# Patient Record
Sex: Female | Born: 1950
Health system: Southern US, Community
[De-identification: ages and names within clinical notes are randomized; demographics above are authoritative.]

## PROBLEM LIST (undated history)

## (undated) DIAGNOSIS — Z0282 Encounter for adoption services: Secondary | ICD-10-CM

## (undated) DIAGNOSIS — M810 Age-related osteoporosis without current pathological fracture: Secondary | ICD-10-CM

## (undated) DIAGNOSIS — R011 Cardiac murmur, unspecified: Secondary | ICD-10-CM

## (undated) DIAGNOSIS — I4892 Unspecified atrial flutter: Secondary | ICD-10-CM

## (undated) DIAGNOSIS — C439 Malignant melanoma of skin, unspecified: Secondary | ICD-10-CM

## (undated) DIAGNOSIS — K759 Inflammatory liver disease, unspecified: Secondary | ICD-10-CM

## (undated) DIAGNOSIS — F32A Depression, unspecified: Secondary | ICD-10-CM

## (undated) DIAGNOSIS — G4730 Sleep apnea, unspecified: Secondary | ICD-10-CM

## (undated) DIAGNOSIS — Z8709 Personal history of other diseases of the respiratory system: Secondary | ICD-10-CM

## (undated) DIAGNOSIS — T8859XA Other complications of anesthesia, initial encounter: Secondary | ICD-10-CM

## (undated) DIAGNOSIS — C801 Malignant (primary) neoplasm, unspecified: Secondary | ICD-10-CM

## (undated) DIAGNOSIS — I8290 Acute embolism and thrombosis of unspecified vein: Secondary | ICD-10-CM

## (undated) DIAGNOSIS — D649 Anemia, unspecified: Secondary | ICD-10-CM

## (undated) DIAGNOSIS — T753XXA Motion sickness, initial encounter: Secondary | ICD-10-CM

## (undated) DIAGNOSIS — C44300 Unspecified malignant neoplasm of skin of unspecified part of face: Secondary | ICD-10-CM

## (undated) DIAGNOSIS — N390 Urinary tract infection, site not specified: Secondary | ICD-10-CM

## (undated) DIAGNOSIS — K5790 Diverticulosis of intestine, part unspecified, without perforation or abscess without bleeding: Secondary | ICD-10-CM

## (undated) DIAGNOSIS — E7800 Pure hypercholesterolemia, unspecified: Secondary | ICD-10-CM

## (undated) DIAGNOSIS — Z972 Presence of dental prosthetic device (complete) (partial): Secondary | ICD-10-CM

## (undated) DIAGNOSIS — G629 Polyneuropathy, unspecified: Secondary | ICD-10-CM

## (undated) DIAGNOSIS — A419 Sepsis, unspecified organism: Secondary | ICD-10-CM

## (undated) DIAGNOSIS — T4145XA Adverse effect of unspecified anesthetic, initial encounter: Secondary | ICD-10-CM

## (undated) DIAGNOSIS — K219 Gastro-esophageal reflux disease without esophagitis: Secondary | ICD-10-CM

## (undated) DIAGNOSIS — F329 Major depressive disorder, single episode, unspecified: Secondary | ICD-10-CM

## (undated) DIAGNOSIS — Z8489 Family history of other specified conditions: Secondary | ICD-10-CM

## (undated) DIAGNOSIS — Z789 Other specified health status: Secondary | ICD-10-CM

## (undated) DIAGNOSIS — M1990 Unspecified osteoarthritis, unspecified site: Secondary | ICD-10-CM

## (undated) DIAGNOSIS — R251 Tremor, unspecified: Secondary | ICD-10-CM

## (undated) HISTORY — PX: COLONOSCOPY: SHX174

## (undated) HISTORY — PX: TRIGGER FINGER RELEASE: SHX641

## (undated) HISTORY — PX: NASAL SEPTUM SURGERY: SHX37

## (undated) HISTORY — PX: VEIN LIGATION: SHX2652

## (undated) HISTORY — PX: FRACTURE SURGERY: SHX138

---

## 1971-02-09 HISTORY — PX: TONSILLECTOMY: SUR1361

## 1973-02-08 DIAGNOSIS — I8290 Acute embolism and thrombosis of unspecified vein: Secondary | ICD-10-CM

## 1973-02-08 HISTORY — DX: Acute embolism and thrombosis of unspecified vein: I82.90

## 1977-02-08 HISTORY — PX: ABDOMINAL HYSTERECTOMY: SHX81

## 1977-02-08 HISTORY — PX: DILATION AND CURETTAGE OF UTERUS: SHX78

## 1987-02-09 HISTORY — PX: HAMMER TOE SURGERY: SHX385

## 1990-02-08 HISTORY — PX: BLADDER SUSPENSION: SHX72

## 2004-07-20 ENCOUNTER — Ambulatory Visit: Payer: Self-pay | Admitting: Unknown Physician Specialty

## 2005-04-12 ENCOUNTER — Ambulatory Visit: Payer: Self-pay | Admitting: Internal Medicine

## 2005-04-24 ENCOUNTER — Ambulatory Visit: Payer: Self-pay | Admitting: Internal Medicine

## 2005-07-13 ENCOUNTER — Ambulatory Visit: Payer: Self-pay | Admitting: Gastroenterology

## 2006-04-14 ENCOUNTER — Ambulatory Visit: Payer: Self-pay | Admitting: Internal Medicine

## 2006-04-25 ENCOUNTER — Ambulatory Visit: Payer: Self-pay | Admitting: Internal Medicine

## 2006-10-25 ENCOUNTER — Ambulatory Visit: Payer: Self-pay | Admitting: Internal Medicine

## 2006-11-09 ENCOUNTER — Ambulatory Visit: Payer: Self-pay | Admitting: Physician Assistant

## 2006-11-17 ENCOUNTER — Ambulatory Visit: Payer: Self-pay | Admitting: Physician Assistant

## 2007-05-23 ENCOUNTER — Ambulatory Visit: Payer: Self-pay | Admitting: Internal Medicine

## 2007-05-25 ENCOUNTER — Ambulatory Visit: Payer: Self-pay | Admitting: Internal Medicine

## 2008-02-09 HISTORY — PX: BREAST BIOPSY: SHX20

## 2008-02-28 ENCOUNTER — Ambulatory Visit: Payer: Self-pay | Admitting: Internal Medicine

## 2008-06-04 ENCOUNTER — Ambulatory Visit: Payer: Self-pay | Admitting: Internal Medicine

## 2008-09-16 ENCOUNTER — Ambulatory Visit: Payer: Self-pay | Admitting: General Surgery

## 2008-11-05 ENCOUNTER — Ambulatory Visit: Payer: Self-pay | Admitting: Gastroenterology

## 2009-09-18 ENCOUNTER — Ambulatory Visit: Payer: Self-pay | Admitting: Internal Medicine

## 2010-04-27 ENCOUNTER — Ambulatory Visit: Payer: Self-pay | Admitting: Internal Medicine

## 2010-10-28 ENCOUNTER — Ambulatory Visit: Payer: Self-pay | Admitting: Internal Medicine

## 2011-10-29 ENCOUNTER — Ambulatory Visit: Payer: Self-pay | Admitting: Internal Medicine

## 2012-11-09 ENCOUNTER — Ambulatory Visit: Payer: Self-pay | Admitting: Internal Medicine

## 2013-11-29 ENCOUNTER — Ambulatory Visit: Payer: Self-pay | Admitting: Internal Medicine

## 2013-12-21 ENCOUNTER — Ambulatory Visit: Payer: Self-pay | Admitting: Gastroenterology

## 2014-01-14 ENCOUNTER — Ambulatory Visit: Payer: Self-pay

## 2014-02-07 ENCOUNTER — Ambulatory Visit: Payer: Self-pay | Admitting: Surgery

## 2014-03-05 ENCOUNTER — Ambulatory Visit: Payer: Self-pay | Admitting: Surgery

## 2014-03-05 HISTORY — PX: KNEE ARTHROSCOPY W/ PARTIAL MEDIAL MENISCECTOMY: SHX1882

## 2014-06-03 LAB — SURGICAL PATHOLOGY

## 2014-06-09 NOTE — Op Note (Signed)
PATIENT NAME:  Annette Quinn, Annette Quinn MR#:  [id number] DATE OF BIRTH:  [birth year]  DATE OF PROCEDURE:  03/05/2014  PREOPERATIVE DIAGNOSIS: Degenerative joint disease with medial meniscus tear, right knee.   POSTOPERATIVE DIAGNOSIS: Degenerative joint disease with medial and lateral meniscal tears, right knee.   PROCEDURE: Arthroscopic partial medial and lateral meniscectomies and abrasion chondroplasty of medial femoral condyle and femoral trochlea, right knee.   SURGEON: Pascal Lux, MD.  ASSISTANT: Francena Hanly, NP.  ANESTHESIA: General endotracheal.   FINDINGS: As noted above. There were extensive grade 3 to 4 chondromalacial changes involving the femoral trochlea, and grade 3 chondromalacial changes involving the medial femoral condyle. There were also grade 3 to 4 chondromalacial changes involving the anterior portion of the lateral femoral condyle and grade 2 to 3 chondromalacial changes involving the medial and lateral tibial plateaus. The anterior and posterior cruciate ligaments both were in satisfactory condition.   COMPLICATIONS: None.   ESTIMATED BLOOD LOSS: Minimal.   TOTAL FLUIDS: 600 mL crystalloid.   TOURNIQUET: None.   DRAINS: None.   CLOSURE: 4-0 Prolene interrupted sutures.   BRIEF CLINICAL NOTE: The patient is a 64 year old female with a several-month history of progressively worsening medial-sided right knee pain. Her symptoms have persisted despite medications, activity modification, et Ronney Asters. Her history and examination are consistent with a medial meniscus tear and underlying degenerative joint disease, both of which were confirmed by MRI scan. The patient presents at this time for arthroscopy, debridement, and partial medial meniscectomy.   DESCRIPTION OF PROCEDURE:  The patient was brought into the operating room and lain in the supine position. After adequate general endotracheal intubation and anesthesia were obtained, the patient's right knee was injected  sterilely using a solution of 30 mL of 0.5% Marcaine with epinephrine and 30 mL of 1% lidocaine. The patient's right lower extremity was prepped with ChloraPrep solution before being draped sterilely. Preoperative antibiotics were administered. The expected portal sites were injected with 0.5% Marcaine with epinephrine before the camera was placed in the anterolateral portal and instrumentation performed through the anteromedial portal. The knee was sequentially examined, beginning in the suprapatellar pouch, then progressing to the patellofemoral space, the medial gutter and compartment, the notch, and finally the lateral compartment and gutter. The findings were as described above. Abundant reactive synovial tissues anteriorly were debrided using the full radius resector in order to improve visualization as well as to hopefully reduce symptoms. The medial compartment was notable for a complex degenerative tear involving the posterior and posteromedial portions of the meniscus, including an unstable flap component that had flipped beneath the meniscus posterior medially. These areas all were debrided back to stable margins using a combination of the straight and up-biting mini-munchers and the full radius resector. Subsequent probing of the remaining rim demonstrated excellent stability. Laterally, there was significant fraying of the central portions of the meniscus posteriorly, laterally, and anteriorly. These areas all were debrided back to stable margins using the full radius resector. Subsequent probing of the remaining rim demonstrated good stability. The areas of grade 3 chondromalacia with unstable articular cartilage on the medial femoral condyle were debrided back to stable margins using the full radius resector. In addition, areas of grade 3 to 4 chondromalacia on the femoral trochlea also were debrided back to stable margins using the full radius resector. The instruments were removed from the joint  after suctioning the excess fluid. The portal sites were reapproximated using 4-0 Prolene interrupted sutures before a sterile bulky dressing was applied to  the knee. The patient was then awakened, extubated, and returned to the recovery room in satisfactory condition after tolerating the procedure well.     ____________________________ J. Dorien Chihuahua, MD jjp:mw D: 03/05/2014 08:42:50 ET T: 03/05/2014 12:54:03 ET JOB#: [id number]  cc: Pascal Lux, MD, <Dictator> JEFF Robby Sermon MD ELECTRONICALLY SIGNED 03/05/2014 17:38

## 2015-02-24 ENCOUNTER — Other Ambulatory Visit: Payer: Self-pay | Admitting: Neurology

## 2015-02-24 ENCOUNTER — Other Ambulatory Visit: Payer: Self-pay | Admitting: Internal Medicine

## 2015-02-24 DIAGNOSIS — R413 Other amnesia: Secondary | ICD-10-CM

## 2015-02-24 DIAGNOSIS — Z1231 Encounter for screening mammogram for malignant neoplasm of breast: Secondary | ICD-10-CM

## 2015-02-27 ENCOUNTER — Ambulatory Visit
Admission: RE | Admit: 2015-02-27 | Discharge: 2015-02-27 | Disposition: A | Discharge status: Home / Self Care | Payer: 59 | Source: Ambulatory Visit | Attending: Neurology | Admitting: Neurology

## 2015-02-27 DIAGNOSIS — R413 Other amnesia: Secondary | ICD-10-CM | POA: Insufficient documentation

## 2015-02-27 DIAGNOSIS — R9082 White matter disease, unspecified: Secondary | ICD-10-CM | POA: Insufficient documentation

## 2015-03-05 ENCOUNTER — Ambulatory Visit: Payer: Self-pay

## 2015-04-02 ENCOUNTER — Ambulatory Visit: Payer: 59 | Attending: Internal Medicine

## 2015-04-23 ENCOUNTER — Ambulatory Visit
Admission: RE | Admit: 2015-04-23 | Discharge: 2015-04-23 | Disposition: A | Discharge status: Home / Self Care | Payer: 59 | Source: Ambulatory Visit | Attending: Internal Medicine | Admitting: Internal Medicine

## 2015-04-23 DIAGNOSIS — Z1231 Encounter for screening mammogram for malignant neoplasm of breast: Secondary | ICD-10-CM | POA: Diagnosis not present

## 2015-09-12 ENCOUNTER — Other Ambulatory Visit: Payer: Self-pay | Admitting: Internal Medicine

## 2015-09-12 DIAGNOSIS — N179 Acute kidney failure, unspecified: Secondary | ICD-10-CM

## 2015-09-17 ENCOUNTER — Ambulatory Visit
Admission: RE | Admit: 2015-09-17 | Discharge: 2015-09-17 | Disposition: A | Discharge status: Home / Self Care | Payer: 59 | Source: Ambulatory Visit | Attending: Internal Medicine | Admitting: Internal Medicine

## 2015-09-17 DIAGNOSIS — N179 Acute kidney failure, unspecified: Secondary | ICD-10-CM | POA: Diagnosis not present

## 2016-03-17 ENCOUNTER — Other Ambulatory Visit: Payer: Self-pay | Admitting: Internal Medicine

## 2016-03-17 DIAGNOSIS — Z1231 Encounter for screening mammogram for malignant neoplasm of breast: Secondary | ICD-10-CM

## 2016-03-24 NOTE — Discharge Instructions (Signed)
[facility] °[facility] ° °POST OPERATIVE INSTRUCTIONS FOR DR. TROXLER AND DR. FOWLER °[facility] ° ° °1. Take your medication as prescribed.  Pain medication should be taken only as needed. ° °2. Keep the dressing clean, dry and intact. ° °3. Keep your foot elevated above the heart level for the first 48 hours. ° °4. Walking to the bathroom and brief periods of walking are acceptable, unless we have instructed you to be non-weight bearing. ° °5. Always wear your post-op shoe when walking.  Always use your crutches if you are to be non-weight bearing. ° °6. Do not take a shower. Baths are permissible as long as the foot is kept out of the water.  ° °7. Every hour you are awake:  °- Bend your knee 15 times. °- Flex foot 15 times °- Massage calf 15 times ° °8. Call [facility] ([phone]) if any of the following problems occur: °- You develop a temperature or fever. °- The bandage becomes saturated with blood. °- Medication does not stop your pain. °- Injury of the foot occurs. °- Any symptoms of infection including redness, odor, or red streaks running from wound. ° ° °General Anesthesia, Adult, Care After °These instructions provide you with information about caring for yourself after your procedure. Your health care provider may also give you more specific instructions. Your treatment has been planned according to current medical practices, but problems sometimes occur. Call your health care provider if you have any problems or questions after your procedure. °What can I expect after the procedure? °After the procedure, it is common to have: °· Vomiting. °· A sore throat. °· Mental slowness. °It is common to feel: °· Nauseous. °· Cold or shivery. °· Sleepy. °· Tired. °· Sore or achy, even in parts of your body where you did not have surgery. °Follow these instructions at home: °For at least 24 hours after the procedure: °· Do not: °¨ Participate in  activities where you could fall or become injured. °¨ Drive. °¨ Use heavy machinery. °¨ Drink alcohol. °¨ Take sleeping pills or medicines that cause drowsiness. °¨ Make important decisions or sign legal documents. °¨ Take care of children on your own. °· Rest. °Eating and drinking °· If you vomit, drink water, juice, or soup when you can drink without vomiting. °· Drink enough fluid to keep your urine clear or pale yellow. °· Make sure you have little or no nausea before eating solid foods. °· Follow the diet recommended by your health care provider. °General instructions °· Have a responsible adult stay with you until you are awake and alert. °· Return to your normal activities as told by your health care provider. Ask your health care provider what activities are safe for you. °· Take over-the-counter and prescription medicines only as told by your health care provider. °· If you smoke, do not smoke without supervision. °· Keep all follow-up visits as told by your health care provider. This is important. °Contact a health care provider if: °· You continue to have nausea or vomiting at home, and medicines are not helpful. °· You cannot drink fluids or start eating again. °· You cannot urinate after 8-12 hours. °· You develop a skin rash. °· You have fever. °· You have increasing redness at the site of your procedure. °Get help right away if: °· You have difficulty breathing. °· You have chest pain. °· You have unexpected bleeding. °· You feel that you are having   a life-threatening or urgent problem. °This information is not intended to replace advice given to you by your health care provider. Make sure you discuss any questions you have with your health care provider. °Document Released: 05/03/2000 Document Revised: 06/30/2015 Document Reviewed: 01/09/2015 °Elsevier Interactive Patient Education © 2017 [employer] ° °

## 2016-03-26 ENCOUNTER — Encounter: Payer: Self-pay | Admitting: *Deleted

## 2016-03-31 ENCOUNTER — Ambulatory Visit
Admission: RE | Admit: 2016-03-31 | Discharge: 2016-03-31 | Disposition: A | Discharge status: Home / Self Care | Payer: 59 | Source: Ambulatory Visit | Attending: Podiatry | Admitting: Podiatry

## 2016-03-31 ENCOUNTER — Encounter
Admission: RE | Disposition: A | Discharge status: Home / Self Care | Payer: Self-pay | Source: Ambulatory Visit | Attending: Podiatry

## 2016-03-31 ENCOUNTER — Ambulatory Visit: Payer: 59 | Admitting: Anesthesiology

## 2016-03-31 DIAGNOSIS — M25871 Other specified joint disorders, right ankle and foot: Secondary | ICD-10-CM | POA: Insufficient documentation

## 2016-03-31 DIAGNOSIS — M2041 Other hammer toe(s) (acquired), right foot: Secondary | ICD-10-CM | POA: Insufficient documentation

## 2016-03-31 DIAGNOSIS — Z87891 Personal history of nicotine dependence: Secondary | ICD-10-CM | POA: Insufficient documentation

## 2016-03-31 DIAGNOSIS — G4730 Sleep apnea, unspecified: Secondary | ICD-10-CM | POA: Insufficient documentation

## 2016-03-31 HISTORY — DX: Adverse effect of unspecified anesthetic, initial encounter: T41.45XA

## 2016-03-31 HISTORY — DX: Unspecified osteoarthritis, unspecified site: M19.90

## 2016-03-31 HISTORY — DX: Age-related osteoporosis without current pathological fracture: M81.0

## 2016-03-31 HISTORY — DX: Encounter for adoption services: Z02.82

## 2016-03-31 HISTORY — DX: Presence of dental prosthetic device (complete) (partial): Z97.2

## 2016-03-31 HISTORY — DX: Gastro-esophageal reflux disease without esophagitis: K21.9

## 2016-03-31 HISTORY — DX: Other complications of anesthesia, initial encounter: T88.59XA

## 2016-03-31 HISTORY — PX: WEIL OSTEOTOMY: SHX5044

## 2016-03-31 HISTORY — DX: Tremor, unspecified: R25.1

## 2016-03-31 HISTORY — DX: Other specified health status: Z78.9

## 2016-03-31 HISTORY — PX: HAMMER TOE SURGERY: SHX385

## 2016-03-31 HISTORY — DX: Motion sickness, initial encounter: T75.3XXA

## 2016-03-31 HISTORY — DX: Polyneuropathy, unspecified: G62.9

## 2016-03-31 HISTORY — DX: Sleep apnea, unspecified: G47.30

## 2016-03-31 SURGERY — OSTEOTOMY, WEIL
Anesthesia: Regional | Site: Foot | Laterality: Right | Wound class: Clean

## 2016-03-31 MED ORDER — PROPOFOL 500 MG/50ML IV EMUL
INTRAVENOUS | Status: DC | PRN
  Administered 2016-03-31: 50 ug/kg/min via INTRAVENOUS

## 2016-03-31 MED ORDER — HYDROCODONE-ACETAMINOPHEN 5-325 MG PO TABS: 1.0000 | ORAL_TABLET | Freq: Four times a day (QID) | ORAL | 0 refills | Status: DC | PRN

## 2016-03-31 MED ORDER — ROPIVACAINE HCL 5 MG/ML IJ SOLN
INTRAMUSCULAR | Status: DC | PRN
  Administered 2016-03-31: 35 mL via PERINEURAL

## 2016-03-31 MED ORDER — LACTATED RINGERS IV SOLN: INTRAVENOUS | Status: DC

## 2016-03-31 MED ORDER — FENTANYL CITRATE (PF) 100 MCG/2ML IJ SOLN
INTRAMUSCULAR | Status: DC | PRN
  Administered 2016-03-31: 50 ug via INTRAVENOUS

## 2016-03-31 MED ORDER — BUPIVACAINE HCL (PF) 0.25 % IJ SOLN
INTRAMUSCULAR | Status: DC | PRN
  Administered 2016-03-31: 5 mL

## 2016-03-31 MED ORDER — DEXTROSE 5 % IV SOLN
600.0000 mg | Freq: Once | INTRAVENOUS | Status: AC
  Administered 2016-03-31: 600 mg via INTRAVENOUS

## 2016-03-31 MED ORDER — LACTATED RINGERS IV SOLN
INTRAVENOUS | Status: DC
  Administered 2016-03-31: 12:00:00 via INTRAVENOUS

## 2016-03-31 MED ORDER — LIDOCAINE HCL (PF) 1 % IJ SOLN
INTRAMUSCULAR | Status: DC | PRN
  Administered 2016-03-31: 5 mL

## 2016-03-31 MED ORDER — MIDAZOLAM HCL 2 MG/2ML IJ SOLN
INTRAMUSCULAR | Status: DC | PRN
Start: 2016-03-31 — End: 2016-03-31
  Administered 2016-03-31: 0.5 mg via INTRAVENOUS
  Administered 2016-03-31: 1 mg via INTRAVENOUS
  Administered 2016-03-31: 0.5 mg via INTRAVENOUS

## 2016-03-31 MED ORDER — ONDANSETRON HCL 4 MG/2ML IJ SOLN
INTRAMUSCULAR | Status: DC | PRN
Start: 2016-03-31 — End: 2016-03-31
  Administered 2016-03-31: 4 mg via INTRAVENOUS

## 2016-03-31 SURGICAL SUPPLY — 57 items
BANDAGE ELASTIC 4 VELCRO NS (GAUZE/BANDAGES/DRESSINGS) ×2 IMPLANT
BENZOIN TINCTURE PRP APPL 2/3 (GAUZE/BANDAGES/DRESSINGS) ×2 IMPLANT
BIT DRILL 1.7 LNG CANN (DRILL) ×2 IMPLANT
BLADE MED AGGRESSIVE (BLADE) IMPLANT
BLADE MINI RND TIP GREEN BEAV (BLADE) ×2 IMPLANT
BLADE OSC/SAGITTAL 5.5X25 (BLADE) ×2 IMPLANT
BLADE OSC/SAGITTAL MD 5.5X18 (BLADE) ×2 IMPLANT
BLADE SURG 15 STRL LF DISP TIS (BLADE) IMPLANT
BLADE SURG 15 STRL SS (BLADE)
BNDG COHESIVE 4X5 TAN STRL (GAUZE/BANDAGES/DRESSINGS) ×2 IMPLANT
BNDG ESMARK 4X12 TAN STRL LF (GAUZE/BANDAGES/DRESSINGS) ×2 IMPLANT
BNDG GAUZE 4.5X4.1 6PLY STRL (MISCELLANEOUS) ×2 IMPLANT
BNDG STRETCH 4X75 STRL LF (GAUZE/BANDAGES/DRESSINGS) ×2 IMPLANT
CANISTER SUCT 1200ML W/VALVE (MISCELLANEOUS) ×2 IMPLANT
CNTRSNK DRL 2 SCR (MISCELLANEOUS) ×1 IMPLANT
COUNTERSINK 2.0 (MISCELLANEOUS) ×1
COVER LIGHT HANDLE UNIVERSAL (MISCELLANEOUS) ×4 IMPLANT
CUFF TOURN SGL QUICK 18 (TOURNIQUET CUFF) ×2 IMPLANT
DRAPE FLUOR MINI C-ARM 54X84 (DRAPES) ×2 IMPLANT
DURAPREP 26ML APPLICATOR (WOUND CARE) ×2 IMPLANT
FIXATION HAMMERTOE ANGLD 15MM (Toe) ×2 IMPLANT
GAUZE PETRO XEROFOAM 1X8 (MISCELLANEOUS) ×2 IMPLANT
GAUZE SPONGE 4X4 12PLY STRL (GAUZE/BANDAGES/DRESSINGS) ×2 IMPLANT
GLOVE BIO SURGEON STRL SZ7.5 (GLOVE) ×4 IMPLANT
GLOVE INDICATOR 8.0 STRL GRN (GLOVE) ×4 IMPLANT
GOWN STRL REUS W/ TWL LRG LVL3 (GOWN DISPOSABLE) ×2 IMPLANT
GOWN STRL REUS W/TWL LRG LVL3 (GOWN DISPOSABLE) ×2
HAMMERTOE ANGLED 15MM 5MM (Toe) ×4 IMPLANT
K-WIRE .9X150 (WIRE) ×4
K-WIRE DBL END TROCAR 6X.045 (WIRE)
K-WIRE DBL END TROCAR 6X.062 (WIRE)
KIT DRILL HAMMERLOCK2 IMPLANT (BIT) ×2 IMPLANT
KIT ROOM TURNOVER OR (KITS) ×2 IMPLANT
KWIRE .9X150 (WIRE) ×2 IMPLANT
KWIRE DBL END TROCAR 6X.045 (WIRE) IMPLANT
KWIRE DBL END TROCAR 6X.062 (WIRE) IMPLANT
NS IRRIG 500ML POUR BTL (IV SOLUTION) ×2 IMPLANT
PACK EXTREMITY ARMC (MISCELLANEOUS) ×2 IMPLANT
PAD GROUND ADULT SPLIT (MISCELLANEOUS) ×2 IMPLANT
PIN BALLS 3/8 F/.045 WIRE (MISCELLANEOUS) IMPLANT
RASP SM TEAR CROSS CUT (RASP) IMPLANT
SCREW HEADLESS 2.0X14MM (Screw) ×2 IMPLANT
SCREW HEADLESS SHRT THRD 2X12 (Screw) ×2 IMPLANT
STOCKINETTE IMPERVIOUS LG (DRAPES) ×2 IMPLANT
STRAP BODY AND KNEE 60X3 (MISCELLANEOUS) ×2 IMPLANT
STRIP CLOSURE SKIN 1/4X4 (GAUZE/BANDAGES/DRESSINGS) ×2 IMPLANT
SUT ETHILON 4-0 (SUTURE) ×2
SUT ETHILON 4-0 FS2 18XMFL BLK (SUTURE) ×2
SUT ETHILON 5-0 FS-2 18 BLK (SUTURE) IMPLANT
SUT MNCRL 5-0+ PC-1 (SUTURE) ×1 IMPLANT
SUT MONOCRYL 5-0 (SUTURE) ×1
SUT VIC AB 2-0 SH 27 (SUTURE)
SUT VIC AB 2-0 SH 27XBRD (SUTURE) IMPLANT
SUT VIC AB 3-0 SH 27 (SUTURE)
SUT VIC AB 3-0 SH 27X BRD (SUTURE) IMPLANT
SUT VIC AB 4-0 FS2 27 (SUTURE) ×2 IMPLANT
SUTURE ETHLN 4-0 FS2 18XMF BLK (SUTURE) ×2 IMPLANT

## 2016-03-31 NOTE — Anesthesia Procedure Notes (Signed)
Procedure Name: MAC Performed by: Kynlie Jane Pre-anesthesia Checklist: Patient identified, Emergency Drugs available, Suction available, Timeout performed and Patient being monitored Patient Re-evaluated:Patient Re-evaluated prior to inductionOxygen Delivery Method: Simple face mask Placement Confirmation: positive ETCO2       

## 2016-03-31 NOTE — Op Note (Signed)
Operative note   Surgeon:Jayvian Escoe    Assistant:none    Preop diagnosis:1. Hammertoe right 2nd toe  2.Hammertoe right 3rd toe  3.plantar displaced metatarsal right 2nd metatarsal  4.  Plantar displaced right 3rd metatarsal    Postop diagnosis:same.    Procedure: 1. Hammertoe repair right second toe with hammerlock implant 2. Hammertoe repair right third toe with hammerlock implant 3. Weil  second metatarsal osteotomy right foot 4. Weil third metatarsal osteotomy right foot    EBL: Minimal    Anesthesia:regional and IV sedation    Hemostasis: Ankle tourniquet inflated to 200 mmHg for approximate 60 minutes    Specimen: None    Complications: None    Operative indications:Annette Quinn is an 66 y.o. that presents today for surgical intervention.  The risks/benefits/alternatives/complications have been discussed and consent has been given.    Procedure:  Patient was brought into the OR and placed on the operating table in thesupine position. After anesthesia was obtained theright lower extremity was prepped and draped in usual sterile fashion.  After sterile prep and drape inflation of the tourniquet attention was directed to the dorsal aspect of the second and third toes. Curvilinear incision from performed beginning at the PIPJ crossing proximal to the MTPJ. Sharp and blunt dissection carried down to the second and third toes down to the long extensor tendons. Transverse incisions were performed at the PIPJ and the long extensor tendons were reflected proximally to the second and third toes. At this time a dorsal medial and lateral capsulotomy of the second and third MTPJ's were performed. Next weil osteotomies of the second and third metatarsal heads were performed. These were allowed to retract proximally. Initially these were stabilized with the guidewires for the 2.0 mm headless cannulated screws from the [employer] many monster screw set. A 14 mm x  2.0 mm screw was placed in the second  metatarsal head and a 12 mm x  2.0 mm headless screw was placed in the third metatarsal. Good alignment and shortening was noted. The ensuing overhanging ledge was transected and resected with a rongeur.  Attention was then directed to the proximal phalangeal joint of the second and third toes. Transverse incisions were made releasing the dorsal medial and lateral capsule. The head of the proximal phalanx and base of the middle phalanx were resected with a power saw. Next the proximal interphalangeal joint fusion was performed with the use of medium-size curved hammerlock implants. Good alignment was noted with good stability. Good anatomic alignment was noted after the procedure had been performed. The wounds were flushed with copious amounts or irrigation. Closure was performed with a 4-0 Vicryl for the long extensor tendon. A 4-0 Vicryl for the subcutaneous tissue and a 4-0 nylon for skin. A bulky sterile compressive dressing was then applied.   Patient tolerated the procedure and anesthesia well.  Was transported from the OR to the PACU with all vital signs stable and vascular status intact. To be discharged per routine protocol.  Will follow up in approximately 1 week in the outpatient clinic.

## 2016-03-31 NOTE — Anesthesia Procedure Notes (Signed)
Anesthesia Regional Block: Popliteal block   Pre-Anesthetic Checklist: ,, timeout performed, Correct Patient, Correct Site, Correct Laterality, Correct Procedure, Correct Position, site marked, Risks and benefits discussed,  Surgical consent,  Pre-op evaluation,  At surgeon's request and post-op pain management  Laterality: Right  Prep: chloraprep       Needles:  Injection technique: Single-shot  Needle Type: Stimulator Needle - 40      Needle Gauge: 21     Additional Needles:   Procedures: ultrasound guided,,,,,,,,  Narrative:  Start time: 03/31/2016 12:03 PM End time: 03/31/2016 12:12 PM Injection made incrementally with aspirations every 35 mL.  Performed by: Personally  Anesthesiologist: Elgie Collard

## 2016-03-31 NOTE — Progress Notes (Signed)
Assisted Annette Quinn ANMD with right popliteal block using ultrasound guidance. Side rails up, monitors on throughout procedure. See vital signs in flow sheet. Tolerated Procedure well.

## 2016-03-31 NOTE — Anesthesia Preprocedure Evaluation (Signed)
Anesthesia Evaluation  Patient identified by MRN, date of birth, ID band Patient awake    Reviewed: Allergy & Precautions, H&P , NPO status , Patient's Chart, lab work & pertinent test results  History of Anesthesia Complications (+) history of anesthetic complications  Airway Mallampati: II  TM Distance: >3 FB Neck ROM: full    Dental  (+) Edentulous Upper, Edentulous Lower   Pulmonary sleep apnea , former smoker,    Pulmonary exam normal        Cardiovascular negative cardio ROS Normal cardiovascular exam     Neuro/Psych negative neurological ROS     GI/Hepatic Neg liver ROS, Medicated,  Endo/Other  negative endocrine ROS  Renal/GU negative Renal ROS  negative genitourinary   Musculoskeletal   Abdominal   Peds  Hematology negative hematology ROS (+)   Anesthesia Other Findings   Reproductive/Obstetrics                             Anesthesia Physical Anesthesia Plan  ASA: II  Anesthesia Plan: Regional   Post-op Pain Management:    Induction:   Airway Management Planned:   Additional Equipment:   Intra-op Plan:   Post-operative Plan:   Informed Consent: I have reviewed the patients History and Physical, chart, labs and discussed the procedure including the risks, benefits and alternatives for the proposed anesthesia with the patient or authorized representative who has indicated his/her understanding and acceptance.     Plan Discussed with:   Anesthesia Plan Comments:         Anesthesia Quick Evaluation

## 2016-03-31 NOTE — Transfer of Care (Signed)
Immediate Anesthesia Transfer of Care Note  Patient: Annette Quinn  Procedure(s) Performed: Procedure(s) with comments: WEIL OSTEOTOMY X2 RIGHT FOOT (Right) - IV WITH POPLITEAL HAMMER TOE CORRECTION RIGHT T6 T7 (Right) - sleep apnea  Patient Location: PACU  Anesthesia Type: Regional  Level of Consciousness: awake, alert  and patient cooperative  Airway and Oxygen Therapy: Patient Spontanous Breathing and Patient connected to supplemental oxygen  Post-op Assessment: Post-op Vital signs reviewed, Patient's Cardiovascular Status Stable, Respiratory Function Stable, Patent Airway and No signs of Nausea or vomiting  Post-op Vital Signs: Reviewed and stable  Complications: No apparent anesthesia complications

## 2016-03-31 NOTE — Anesthesia Postprocedure Evaluation (Signed)
Anesthesia Post Note  Patient: Annette Quinn  Procedure(s) Performed: Procedure(s) (LRB): WEIL OSTEOTOMY X2 RIGHT FOOT (Right) HAMMER TOE CORRECTION RIGHT T6 T7 (Right)  Patient location during evaluation: PACU Anesthesia Type: Regional Level of consciousness: awake Pain management: pain level controlled Respiratory status: spontaneous breathing Cardiovascular status: blood pressure returned to baseline Postop Assessment: no headache Anesthetic complications: no    Annlee Glandon, III,  Tamela Elsayed D

## 2016-03-31 NOTE — H&P (Signed)
HISTORY AND PHYSICAL INTERVAL NOTE:  03/31/2016  11:49 AM  Annette Quinn  has presented today for surgery, with the diagnosis of M20.41 HAMMERTOE OF RIGHT FOOT.  The various methods of treatment have been discussed with the patient.  No guarantees were given.  After consideration of risks, benefits and other options for treatment, the patient has consented to surgery.  I have reviewed the patients' chart and labs.    Patient Vitals for the past 24 hrs:  BP Temp Temp src Pulse Resp SpO2 Height Weight  03/31/16 1144 133/72 98.2 F (36.8 C) Tympanic 60 18 94 % 5' 4.5" (1.638 m) 96.2 kg (212 lb)    A history and physical examination was performed in my office.  The patient was reexamined.  There have been no changes to this history and physical examination.  Samara Deist A

## 2016-04-01 ENCOUNTER — Encounter: Payer: Self-pay | Admitting: Podiatry

## 2016-05-04 ENCOUNTER — Ambulatory Visit
Admission: RE | Admit: 2016-05-04 | Discharge: 2016-05-04 | Disposition: A | Discharge status: Home / Self Care | Payer: 59 | Source: Ambulatory Visit | Attending: Internal Medicine | Admitting: Internal Medicine

## 2016-05-04 DIAGNOSIS — Z1231 Encounter for screening mammogram for malignant neoplasm of breast: Secondary | ICD-10-CM | POA: Diagnosis not present

## 2016-08-19 ENCOUNTER — Encounter
Admission: RE | Admit: 2016-08-19 | Discharge: 2016-08-19 | Disposition: A | Discharge status: Home / Self Care | Payer: 59 | Source: Ambulatory Visit | Attending: Orthopedic Surgery | Admitting: Orthopedic Surgery

## 2016-08-19 DIAGNOSIS — Z01818 Encounter for other preprocedural examination: Secondary | ICD-10-CM | POA: Diagnosis not present

## 2016-08-19 HISTORY — DX: Unspecified malignant neoplasm of skin of unspecified part of face: C44.300

## 2016-08-19 HISTORY — DX: Malignant (primary) neoplasm, unspecified: C80.1

## 2016-08-19 HISTORY — DX: Malignant melanoma of skin, unspecified: C43.9

## 2016-08-19 NOTE — Patient Instructions (Addendum)
Your procedure is scheduled on: Thursday, August 26, 2016 Report to Same Day Surgery on the 2nd floor in the [facility]. To find out your arrival time, please call [phone] between 1PM - 3PM on: Wednesday, August 25, 2016  REMEMBER: Instructions that are not followed completely may result in serious medical risk up to and including death; or upon the discretion of your surgeon and anesthesiologist your surgery may need to be rescheduled.  Do not eat food or drink liquids after midnight. No gum chewing or hard candies  No Alcohol for 24 hours before or after surgery.  No Smoking for 24 hours prior to surgery.  Notify your doctor if there is any change in your medical condition (cold, fever, infection).  Do not wear jewelry, make-up, hairpins, clips or nail polish.  Do not wear lotions, powders, or perfumes.   Do not shave 48 hours prior to surgery.   Contacts and dentures may not be worn into surgery.  Do not bring valuables to the hospital. [facility] is not responsible for any belongings or valuables.   TAKE THESE MEDICATIONS THE MORNING OF SURGERY WITH A SIP OF WATER:  1.  Buspirone 2.  Gabapentin 3.  Pantoprazole (Take 1 tablet the night before surgery and 1 tablet the morning of surgery) 4.  Propranolol 5.  Sertraline  Use CHG Soap or wipes as directed on instruction sheet.  Use inhalers on the day of surgery and bring to the hospital. (Albuterol)  Bring your C-PAP to the hospital with you in case you may have to spend the night.   NOW - Stop Anti-inflammatories such as Advil, Aleve, Ibuprofen, Motrin, Naproxen, Naprosyn, Goodie powder, or aspirin products. (May take Tylenol or Acetaminophen if needed.)  Stop supplements until after surgery. (May continue Vitamin D and multivitamin.)  If you are being discharged the day of surgery, you will not be allowed to drive home. You will need someone to drive you home and stay with you that night.   If you are taking  public transportation, you will need to have a responsible adult to with you.

## 2016-08-26 ENCOUNTER — Encounter: Payer: Self-pay | Admitting: *Deleted

## 2016-08-26 ENCOUNTER — Ambulatory Visit
Admission: RE | Admit: 2016-08-26 | Discharge: 2016-08-26 | Disposition: A | Discharge status: Home / Self Care | Payer: 59 | Source: Ambulatory Visit | Attending: Orthopedic Surgery | Admitting: Orthopedic Surgery

## 2016-08-26 ENCOUNTER — Ambulatory Visit: Payer: 59 | Admitting: Anesthesiology

## 2016-08-26 ENCOUNTER — Ambulatory Visit: Payer: 59

## 2016-08-26 ENCOUNTER — Encounter
Admission: RE | Disposition: A | Discharge status: Home / Self Care | Payer: Self-pay | Source: Ambulatory Visit | Attending: Orthopedic Surgery

## 2016-08-26 DIAGNOSIS — G629 Polyneuropathy, unspecified: Secondary | ICD-10-CM | POA: Insufficient documentation

## 2016-08-26 DIAGNOSIS — M19042 Primary osteoarthritis, left hand: Secondary | ICD-10-CM | POA: Insufficient documentation

## 2016-08-26 DIAGNOSIS — Z87891 Personal history of nicotine dependence: Secondary | ICD-10-CM | POA: Insufficient documentation

## 2016-08-26 DIAGNOSIS — Z79899 Other long term (current) drug therapy: Secondary | ICD-10-CM | POA: Diagnosis not present

## 2016-08-26 DIAGNOSIS — F329 Major depressive disorder, single episode, unspecified: Secondary | ICD-10-CM | POA: Diagnosis not present

## 2016-08-26 DIAGNOSIS — G4733 Obstructive sleep apnea (adult) (pediatric): Secondary | ICD-10-CM | POA: Insufficient documentation

## 2016-08-26 DIAGNOSIS — E785 Hyperlipidemia, unspecified: Secondary | ICD-10-CM | POA: Insufficient documentation

## 2016-08-26 DIAGNOSIS — Z9989 Dependence on other enabling machines and devices: Secondary | ICD-10-CM | POA: Insufficient documentation

## 2016-08-26 DIAGNOSIS — K219 Gastro-esophageal reflux disease without esophagitis: Secondary | ICD-10-CM | POA: Diagnosis not present

## 2016-08-26 DIAGNOSIS — G8918 Other acute postprocedural pain: Secondary | ICD-10-CM

## 2016-08-26 HISTORY — PX: DISTAL INTERPHALANGEAL JOINT FUSION: SHX6428

## 2016-08-26 HISTORY — DX: Acute embolism and thrombosis of unspecified vein: I82.90

## 2016-08-26 SURGERY — DISTAL INTERPHALANGEAL JOINT FUSION
Anesthesia: General | Laterality: Left

## 2016-08-26 MED ORDER — MIDAZOLAM HCL 2 MG/2ML IJ SOLN
INTRAMUSCULAR | Status: AC
  Filled 2016-08-26: qty 2

## 2016-08-26 MED ORDER — MIDAZOLAM HCL 2 MG/2ML IJ SOLN
INTRAMUSCULAR | Status: DC | PRN
  Administered 2016-08-26: 2 mg via INTRAVENOUS

## 2016-08-26 MED ORDER — PROPOFOL 10 MG/ML IV BOLUS
INTRAVENOUS | Status: DC | PRN
  Administered 2016-08-26: 50 mg via INTRAVENOUS
  Administered 2016-08-26: 100 mg via INTRAVENOUS

## 2016-08-26 MED ORDER — PROPOFOL 10 MG/ML IV BOLUS
INTRAVENOUS | Status: AC
  Filled 2016-08-26: qty 20

## 2016-08-26 MED ORDER — LIDOCAINE HCL (PF) 2 % IJ SOLN
INTRAMUSCULAR | Status: AC
  Filled 2016-08-26: qty 2

## 2016-08-26 MED ORDER — PROPOFOL 500 MG/50ML IV EMUL
INTRAVENOUS | Status: AC
Start: 2016-08-26 — End: 2016-08-26
  Filled 2016-08-26: qty 50

## 2016-08-26 MED ORDER — DEXAMETHASONE SODIUM PHOSPHATE 10 MG/ML IJ SOLN
INTRAMUSCULAR | Status: DC | PRN
  Administered 2016-08-26: 10 mg via INTRAVENOUS

## 2016-08-26 MED ORDER — ONDANSETRON HCL 4 MG/2ML IJ SOLN
INTRAMUSCULAR | Status: AC
Start: 2016-08-26 — End: 2016-08-26
  Filled 2016-08-26: qty 2

## 2016-08-26 MED ORDER — OXYCODONE HCL 5 MG PO TABS: 5.0000 mg | ORAL_TABLET | Freq: Once | ORAL | Status: DC | PRN

## 2016-08-26 MED ORDER — NEOMYCIN-POLYMYXIN B GU 40-200000 IR SOLN
Status: AC
  Filled 2016-08-26: qty 2

## 2016-08-26 MED ORDER — ONDANSETRON HCL 4 MG PO TABS: 4.0000 mg | ORAL_TABLET | Freq: Four times a day (QID) | ORAL | Status: DC | PRN

## 2016-08-26 MED ORDER — METOCLOPRAMIDE HCL 10 MG PO TABS: 5.0000 mg | ORAL_TABLET | Freq: Three times a day (TID) | ORAL | Status: DC | PRN

## 2016-08-26 MED ORDER — CLINDAMYCIN PHOSPHATE 900 MG/50ML IV SOLN
INTRAVENOUS | Status: AC
  Filled 2016-08-26: qty 50

## 2016-08-26 MED ORDER — LACTATED RINGERS IV SOLN
INTRAVENOUS | Status: DC
  Administered 2016-08-26: 11:00:00 via INTRAVENOUS

## 2016-08-26 MED ORDER — DEXAMETHASONE SODIUM PHOSPHATE 10 MG/ML IJ SOLN
INTRAMUSCULAR | Status: AC
  Filled 2016-08-26: qty 1

## 2016-08-26 MED ORDER — ONDANSETRON HCL 4 MG/2ML IJ SOLN
INTRAMUSCULAR | Status: DC | PRN
  Administered 2016-08-26: 4 mg via INTRAVENOUS

## 2016-08-26 MED ORDER — ONDANSETRON HCL 4 MG/2ML IJ SOLN: 4.0000 mg | Freq: Four times a day (QID) | INTRAMUSCULAR | Status: DC | PRN

## 2016-08-26 MED ORDER — FENTANYL CITRATE (PF) 100 MCG/2ML IJ SOLN
INTRAMUSCULAR | Status: DC | PRN
  Administered 2016-08-26 (×2): 25 ug via INTRAVENOUS
  Administered 2016-08-26: 50 ug via INTRAVENOUS

## 2016-08-26 MED ORDER — BUPIVACAINE HCL (PF) 0.5 % IJ SOLN
INTRAMUSCULAR | Status: AC
  Filled 2016-08-26: qty 30

## 2016-08-26 MED ORDER — CLINDAMYCIN PHOSPHATE 900 MG/50ML IV SOLN
900.0000 mg | Freq: Once | INTRAVENOUS | Status: AC
  Administered 2016-08-26: 900 mg via INTRAVENOUS

## 2016-08-26 MED ORDER — SODIUM CHLORIDE 0.9 % IV SOLN: INTRAVENOUS | Status: DC

## 2016-08-26 MED ORDER — HYDROCODONE-ACETAMINOPHEN 5-325 MG PO TABS: 1.0000 | ORAL_TABLET | ORAL | Status: DC | PRN

## 2016-08-26 MED ORDER — NEOMYCIN-POLYMYXIN B GU 40-200000 IR SOLN
Status: DC | PRN
  Administered 2016-08-26: 2 mL

## 2016-08-26 MED ORDER — PROPOFOL 500 MG/50ML IV EMUL
INTRAVENOUS | Status: DC | PRN
  Administered 2016-08-26: 120 ug/kg/min via INTRAVENOUS

## 2016-08-26 MED ORDER — FENTANYL CITRATE (PF) 100 MCG/2ML IJ SOLN
INTRAMUSCULAR | Status: AC
  Filled 2016-08-26: qty 2

## 2016-08-26 MED ORDER — PHENYLEPHRINE HCL 10 MG/ML IJ SOLN
INTRAMUSCULAR | Status: DC | PRN
  Administered 2016-08-26: 100 ug via INTRAVENOUS

## 2016-08-26 MED ORDER — METOCLOPRAMIDE HCL 5 MG/ML IJ SOLN: 5.0000 mg | Freq: Three times a day (TID) | INTRAMUSCULAR | Status: DC | PRN

## 2016-08-26 MED ORDER — BUPIVACAINE HCL (PF) 0.5 % IJ SOLN
INTRAMUSCULAR | Status: DC | PRN
  Administered 2016-08-26: 10 mL

## 2016-08-26 MED ORDER — LIDOCAINE HCL (CARDIAC) 20 MG/ML IV SOLN
INTRAVENOUS | Status: DC | PRN
  Administered 2016-08-26: 60 mg via INTRAVENOUS

## 2016-08-26 MED ORDER — FENTANYL CITRATE (PF) 100 MCG/2ML IJ SOLN
INTRAMUSCULAR | Status: AC
  Administered 2016-08-26: 25 ug via INTRAVENOUS
  Filled 2016-08-26: qty 2

## 2016-08-26 MED ORDER — OXYCODONE HCL 5 MG/5ML PO SOLN: 5.0000 mg | Freq: Once | ORAL | Status: DC | PRN

## 2016-08-26 MED ORDER — FENTANYL CITRATE (PF) 100 MCG/2ML IJ SOLN
25.0000 ug | INTRAMUSCULAR | Status: DC | PRN
  Administered 2016-08-26 (×4): 25 ug via INTRAVENOUS

## 2016-08-26 SURGICAL SUPPLY — 32 items
BIT DRILL 24 ACUTRAK FUSION (BIT) ×3 IMPLANT
BNDG GAUZE 1X2.1 STRL (MISCELLANEOUS) ×3 IMPLANT
CAST PADDING 2X4YD ST 30245 (MISCELLANEOUS) ×2
CHLORAPREP W/TINT 26ML (MISCELLANEOUS) ×3 IMPLANT
CUFF TOURN 18 STER (MISCELLANEOUS) IMPLANT
DRAPE FLUOR MINI C-ARM 54X84 (DRAPES) ×3 IMPLANT
ELECT CAUTERY NEEDLE TIP 1.0 (MISCELLANEOUS) ×3
ELECT REM PT RETURN 9FT ADLT (ELECTROSURGICAL) ×3
ELECTRODE CAUTERY NEDL TIP 1.0 (MISCELLANEOUS) ×1 IMPLANT
ELECTRODE REM PT RTRN 9FT ADLT (ELECTROSURGICAL) ×1 IMPLANT
GAUZE PETRO XEROFOAM 1X8 (MISCELLANEOUS) ×3 IMPLANT
GAUZE SPONGE 4X4 12PLY STRL (GAUZE/BANDAGES/DRESSINGS) ×3 IMPLANT
GAUZE XEROFORM 4X4 STRL (GAUZE/BANDAGES/DRESSINGS) ×3 IMPLANT
GLOVE BIOGEL M 7.0 STRL (GLOVE) ×3 IMPLANT
GLOVE BIOGEL PI IND STRL 7.0 (GLOVE) ×1 IMPLANT
GLOVE BIOGEL PI INDICATOR 7.0 (GLOVE) ×2
GLOVE SURG SYN 9.0  PF PI (GLOVE) ×2
GLOVE SURG SYN 9.0 PF PI (GLOVE) ×1 IMPLANT
GOWN SRG 2XL LVL 4 RGLN SLV (GOWNS) ×1 IMPLANT
GOWN STRL NON-REIN 2XL LVL4 (GOWNS) ×2
GOWN STRL REUS W/ TWL LRG LVL3 (GOWN DISPOSABLE) ×1 IMPLANT
GOWN STRL REUS W/TWL LRG LVL3 (GOWN DISPOSABLE) ×2
GUIDEWIRE ORTHO 062 (WIRE) ×3 IMPLANT
KIT RM TURNOVER STRD PROC AR (KITS) ×3 IMPLANT
NEEDLE FILTER BLUNT 18X 1/2SAF (NEEDLE) ×2
NEEDLE FILTER BLUNT 18X1 1/2 (NEEDLE) ×1 IMPLANT
NS IRRIG 500ML POUR BTL (IV SOLUTION) ×3 IMPLANT
PACK EXTREMITY ARMC (MISCELLANEOUS) ×3 IMPLANT
PAD PREP 24X41 OB/GYN DISP (PERSONAL CARE ITEMS) ×3 IMPLANT
PADDING CAST COTTON 2X4 ST (MISCELLANEOUS) ×1 IMPLANT
SCREW ACUTRAK FUSION 24MM (Screw) ×3 IMPLANT
SUT ETHILON 5-0 FS-2 18 BLK (SUTURE) ×3 IMPLANT

## 2016-08-26 NOTE — Op Note (Signed)
08/26/2016  12:50 PM  PATIENT:  Annette Quinn  66 y.o. female  PRE-OPERATIVE DIAGNOSIS:  degenerative arthritis of left index finger  POST-OPERATIVE DIAGNOSIS:  degenerative arthritis of left index finger  PROCEDURE:  Procedure(s): DISTAL INTERPHALANGEAL JOINT FUSION-LEFT INDEX FINGER (Left)  SURGEON: Laurene Footman, MD  ASSISTANTS: None  ANESTHESIA:   general  EBL:  Total I/O In: -  Out: 5 [Blood:5]  BLOOD ADMINISTERED:none  DRAINS: none   LOCAL MEDICATIONS USED:  MARCAINE     SPECIMEN:  No Specimen  DISPOSITION OF SPECIMEN:  N/A  COUNTS:  YES  TOURNIQUET:    IMPLANTS: acumed fusion screw 24 mm  DICTATION: .Dragon Dictation patient brought the operating room and after adequate anesthesia was obtained the left arm was prepped and draped in sterile fashion after appropriate patient education timeout procedure local anesthetic was infiltrated for digital block 10 cc of half percent Marcaine plain. Tourniquet was raised after timeout and a T-shaped incision was made over the DIP joint with the extensor tendon transected. Dorsal spur was removed from the distal and middle phalanges and the joint exposed with very sclerotic bone present. This was debrided with use of a small curette and small rongeur until bleeding bone was obtained. The K wires then sent out through the tip of the finger and retrograde back into the middle phalanx measurement made following this drilling was carried out followed by placement of the 24 mm fusion screw was countersunk showed be not palpable under the distal phalanges. And a zipper appropriate location with good compression at the fracture site the screwdriver was removed and appropriate rotation and position of the finger was obtained. Wound was then thoroughly irrigated and then closed with simple interrupted 4-0 nylon skin suture followed by Xeroform 4 x 4's and Ace wrap  PLAN OF CARE: Discharge to home after PACU  PATIENT DISPOSITION:  PACU -  hemodynamically stable.

## 2016-08-26 NOTE — Progress Notes (Addendum)
left hand elevated on pillows    Capillary refill positive to left hand

## 2016-08-26 NOTE — Progress Notes (Signed)
Ice pack to left hand

## 2016-08-26 NOTE — Anesthesia Preprocedure Evaluation (Signed)
Anesthesia Evaluation  Patient identified by MRN, date of birth, ID band Patient awake    Reviewed: Allergy & Precautions, H&P , NPO status , Patient's Chart, lab work & pertinent test results  History of Anesthesia Complications (+) POST - OP SPINAL HEADACHE and history of anesthetic complications  Airway Mallampati: III  TM Distance: <3 FB Neck ROM: limited    Dental  (+) Poor Dentition, Missing, Lower Dentures, Upper Dentures   Pulmonary neg shortness of breath, sleep apnea , former smoker,           Cardiovascular Exercise Tolerance: Good (-) angina(-) Past MI and (-) DOE negative cardio ROS       Neuro/Psych negative neurological ROS  negative psych ROS   GI/Hepatic Neg liver ROS, GERD  Medicated and Controlled,  Endo/Other  negative endocrine ROS  Renal/GU      Musculoskeletal  (+) Arthritis ,   Abdominal   Peds  Hematology negative hematology ROS (+)   Anesthesia Other Findings Past Medical History: No date: Adopted No date: Arthritis     Comment:  osteoarthritis 1975: Blood clot in vein No date: Cancer ([place]) No date: Complication of anesthesia     Comment:  body aches after anesthesia No date: GERD (gastroesophageal reflux disease) No date: Motion sickness     Comment:  curvy/hilly roads No date: Neuropathy     Comment:  upper legs No date: Osteoporosis No date: Skin cancer (melanoma) ([place])     Comment:  left upper thigh No date: Skin cancer of face     Comment:  basal cell No date: Sleep apnea     Comment:  CPAP No date: Tremor No date: Wears dentures     Comment:  full upper and lower  Past Surgical History: 1979: ABDOMINAL HYSTERECTOMY 1992: BLADDER SUSPENSION 2010: BREAST BIOPSY; Left     Comment:  benign 2010, 2015: COLONOSCOPY 1979: DILATION AND CURETTAGE OF UTERUS 1989: HAMMER TOE SURGERY; Left 03/31/2016: HAMMER TOE SURGERY; Right     Comment:  Procedure: HAMMER TOE  CORRECTION RIGHT T6 T7;  Surgeon:               Samara Deist, DPM;  Location: [facility];                Service: Podiatry;  Laterality: Right;  sleep apnea 03/05/2014: KNEE ARTHROSCOPY W/ PARTIAL MEDIAL MENISCECTOMY; Right     Comment:  medial and lateral.  Abrasion hondroplasty of medial               femoral condyle and femorla trochlea. No date: NASAL SEPTUM SURGERY 1973: TONSILLECTOMY No date: TRIGGER FINGER RELEASE; Left     Comment:  thumb No date: VEIN LIGATION 03/31/2016: WEIL OSTEOTOMY; Right     Comment:  Procedure: WEIL OSTEOTOMY X2 RIGHT FOOT;  Surgeon:               Samara Deist, DPM;  Location: [facility];                Service: Podiatry;  Laterality: Right;  IV WITH POPLITEAL     Reproductive/Obstetrics negative OB ROS                             Anesthesia Physical Anesthesia Plan  ASA: III  Anesthesia Plan: General LMA   Post-op Pain Management:    Induction: Intravenous  PONV Risk Score and Plan: 3 and Ondansetron, Dexamethasone, Propofol and  Midazolam  Airway Management Planned: LMA  Additional Equipment:   Intra-op Plan:   Post-operative Plan: Extubation in OR  Informed Consent: I have reviewed the patients History and Physical, chart, labs and discussed the procedure including the risks, benefits and alternatives for the proposed anesthesia with the patient or authorized representative who has indicated his/her understanding and acceptance.   Dental Advisory Given  Plan Discussed with: Anesthesiologist, CRNA and Surgeon  Anesthesia Plan Comments: (Patient reports myalgias after every anesthetic.  Plan for non triggering MH anesthetic.  Patient consented for risks of anesthesia including but not limited to:  - adverse reactions to medications - damage to teeth, lips or other oral mucosa - sore throat or hoarseness - Damage to heart, brain, lungs or loss of life  Patient voiced understanding.)         Anesthesia Quick Evaluation

## 2016-08-26 NOTE — Transfer of Care (Signed)
Immediate Anesthesia Transfer of Care Note  Patient: Annette Quinn  Procedure(s) Performed: Procedure(s): DISTAL INTERPHALANGEAL JOINT FUSION-LEFT INDEX FINGER (Left)  Patient Location: PACU  Anesthesia Type:General  Level of Consciousness: awake and sedated  Airway & Oxygen Therapy: Patient Spontanous Breathing and Patient connected to face mask oxygen  Post-op Assessment: Report given to RN and Post -op Vital signs reviewed and stable  Post vital signs: Reviewed and stable  Last Vitals:  Vitals:   08/26/16 1020  BP: 129/60  Pulse: (!) 57  Resp: 20  Temp: 36.7 C    Last Pain:  Vitals:   08/26/16 1020  TempSrc: Oral  PainSc: 3          Complications: No apparent anesthesia complications

## 2016-08-26 NOTE — Progress Notes (Signed)
Capillary refill positive to left han d and left hand elevated on pillows

## 2016-08-26 NOTE — H&P (Signed)
Reviewed paper H+P, will be scanned into chart. No changes noted.  

## 2016-08-26 NOTE — Anesthesia Post-op Follow-up Note (Cosign Needed)
Anesthesia QCDR form completed.        

## 2016-08-26 NOTE — Anesthesia Procedure Notes (Signed)
Procedure Name: LMA Insertion Date/Time: 08/26/2016 11:43 AM Performed by: Dionne Bucy Pre-anesthesia Checklist: Patient identified, Patient being monitored, Timeout performed, Emergency Drugs available and Suction available Patient Re-evaluated:Patient Re-evaluated prior to induction Oxygen Delivery Method: Circle system utilized Preoxygenation: Pre-oxygenation with 100% oxygen Induction Type: IV induction Ventilation: Mask ventilation without difficulty LMA: LMA inserted LMA Size: 4.5 Tube type: Oral Number of attempts: 1 Placement Confirmation: positive ETCO2 and breath sounds checked- equal and bilateral Tube secured with: Tape Dental Injury: Teeth and Oropharynx as per pre-operative assessment

## 2016-08-26 NOTE — Discharge Instructions (Signed)
Keep arm elevated as much as possible. Pain medicine as directed. Try not to use the hand

## 2016-08-26 NOTE — Anesthesia Postprocedure Evaluation (Signed)
Anesthesia Post Note  Patient: Annette Quinn  Procedure(s) Performed: Procedure(s) (LRB): DISTAL INTERPHALANGEAL JOINT FUSION-LEFT INDEX FINGER (Left)  Patient location during evaluation: PACU Anesthesia Type: General Level of consciousness: awake and alert Pain management: pain level controlled Vital Signs Assessment: post-procedure vital signs reviewed and stable Respiratory status: spontaneous breathing, nonlabored ventilation, respiratory function stable and patient connected to nasal cannula oxygen Cardiovascular status: blood pressure returned to baseline and stable Postop Assessment: no signs of nausea or vomiting Anesthetic complications: no     Last Vitals:  Vitals:   08/26/16 1406 08/26/16 1426  BP: 129/62 (!) 119/59  Pulse: 62 68  Resp: 16   Temp: (!) 36.2 C     Last Pain:  Vitals:   08/26/16 1426  TempSrc:   PainSc: 2                  Precious Haws Jeneen Doutt

## 2016-08-27 ENCOUNTER — Encounter: Payer: Self-pay | Admitting: Orthopedic Surgery

## 2016-12-08 ENCOUNTER — Encounter
Admission: RE | Disposition: A | Discharge status: Home / Self Care | Payer: Self-pay | Source: Ambulatory Visit | Attending: Ophthalmology

## 2016-12-08 ENCOUNTER — Ambulatory Visit: Payer: 59 | Admitting: Anesthesiology

## 2016-12-08 ENCOUNTER — Ambulatory Visit
Admission: RE | Admit: 2016-12-08 | Discharge: 2016-12-08 | Disposition: A | Discharge status: Home / Self Care | Payer: 59 | Source: Ambulatory Visit | Attending: Ophthalmology | Admitting: Ophthalmology

## 2016-12-08 DIAGNOSIS — F329 Major depressive disorder, single episode, unspecified: Secondary | ICD-10-CM | POA: Diagnosis not present

## 2016-12-08 DIAGNOSIS — Z79899 Other long term (current) drug therapy: Secondary | ICD-10-CM | POA: Diagnosis not present

## 2016-12-08 DIAGNOSIS — H2511 Age-related nuclear cataract, right eye: Secondary | ICD-10-CM | POA: Insufficient documentation

## 2016-12-08 DIAGNOSIS — J449 Chronic obstructive pulmonary disease, unspecified: Secondary | ICD-10-CM | POA: Insufficient documentation

## 2016-12-08 DIAGNOSIS — Z87891 Personal history of nicotine dependence: Secondary | ICD-10-CM | POA: Diagnosis not present

## 2016-12-08 DIAGNOSIS — K219 Gastro-esophageal reflux disease without esophagitis: Secondary | ICD-10-CM | POA: Diagnosis not present

## 2016-12-08 DIAGNOSIS — E7800 Pure hypercholesterolemia, unspecified: Secondary | ICD-10-CM | POA: Diagnosis not present

## 2016-12-08 HISTORY — DX: Pure hypercholesterolemia, unspecified: E78.00

## 2016-12-08 HISTORY — PX: CATARACT EXTRACTION W/PHACO: SHX586

## 2016-12-08 HISTORY — DX: Major depressive disorder, single episode, unspecified: F32.9

## 2016-12-08 HISTORY — DX: Inflammatory liver disease, unspecified: K75.9

## 2016-12-08 HISTORY — DX: Depression, unspecified: F32.A

## 2016-12-08 HISTORY — DX: Diverticulosis of intestine, part unspecified, without perforation or abscess without bleeding: K57.90

## 2016-12-08 HISTORY — DX: Personal history of other diseases of the respiratory system: Z87.09

## 2016-12-08 SURGERY — PHACOEMULSIFICATION, CATARACT, WITH IOL INSERTION
Anesthesia: Monitor Anesthesia Care | Site: Eye | Laterality: Right | Wound class: Clean

## 2016-12-08 MED ORDER — MOXIFLOXACIN HCL 0.5 % OP SOLN
1.0000 [drp] | Freq: Once | OPHTHALMIC | Status: AC
  Administered 2016-12-08: 1 [drp] via OPHTHALMIC
  Filled 2016-12-08: qty 3

## 2016-12-08 MED ORDER — MOXIFLOXACIN HCL 0.5 % OP SOLN
OPHTHALMIC | Status: AC
  Filled 2016-12-08: qty 3

## 2016-12-08 MED ORDER — LIDOCAINE HCL (PF) 4 % IJ SOLN
INTRAMUSCULAR | Status: AC
  Filled 2016-12-08: qty 5

## 2016-12-08 MED ORDER — POVIDONE-IODINE 5 % OP SOLN
OPHTHALMIC | Status: DC | PRN
  Administered 2016-12-08: 1 via OPHTHALMIC

## 2016-12-08 MED ORDER — LIDOCAINE HCL (PF) 4 % IJ SOLN
INTRAMUSCULAR | Status: DC | PRN
  Administered 2016-12-08: 4 mL via OPHTHALMIC

## 2016-12-08 MED ORDER — SODIUM CHLORIDE 0.9 % IV SOLN
INTRAVENOUS | Status: DC
  Administered 2016-12-08: 06:00:00 via INTRAVENOUS

## 2016-12-08 MED ORDER — NA CHONDROIT SULF-NA HYALURON 40-17 MG/ML IO SOLN
INTRAOCULAR | Status: DC | PRN
  Administered 2016-12-08: 1 mL via INTRAOCULAR

## 2016-12-08 MED ORDER — ARMC OPHTHALMIC DILATING DROPS
OPHTHALMIC | Status: AC
  Administered 2016-12-08: 1 via OPHTHALMIC
  Filled 2016-12-08: qty 0.4

## 2016-12-08 MED ORDER — TRYPAN BLUE 0.06 % OP SOLN
OPHTHALMIC | Status: AC
  Filled 2016-12-08: qty 0.5

## 2016-12-08 MED ORDER — CARBACHOL 0.01 % IO SOLN
INTRAOCULAR | Status: DC | PRN
  Administered 2016-12-08: 0.5 mL via INTRAOCULAR

## 2016-12-08 MED ORDER — ARMC OPHTHALMIC DILATING DROPS
1.0000 "application " | OPHTHALMIC | Status: AC | PRN
  Administered 2016-12-08 (×3): 1 via OPHTHALMIC

## 2016-12-08 MED ORDER — EPINEPHRINE PF 1 MG/ML IJ SOLN
INTRAMUSCULAR | Status: AC
  Filled 2016-12-08: qty 2

## 2016-12-08 MED ORDER — MIDAZOLAM HCL 2 MG/2ML IJ SOLN
INTRAMUSCULAR | Status: DC | PRN
  Administered 2016-12-08: 2 mg via INTRAVENOUS

## 2016-12-08 MED ORDER — POVIDONE-IODINE 5 % OP SOLN
OPHTHALMIC | Status: AC
  Filled 2016-12-08: qty 30

## 2016-12-08 MED ORDER — EPINEPHRINE PF 1 MG/ML IJ SOLN
INTRAOCULAR | Status: DC | PRN
  Administered 2016-12-08: 08:00:00 via OPHTHALMIC

## 2016-12-08 MED ORDER — MIDAZOLAM HCL 2 MG/2ML IJ SOLN
INTRAMUSCULAR | Status: AC
  Filled 2016-12-08: qty 2

## 2016-12-08 MED ORDER — NA CHONDROIT SULF-NA HYALURON 40-17 MG/ML IO SOLN
INTRAOCULAR | Status: AC
  Filled 2016-12-08: qty 1

## 2016-12-08 SURGICAL SUPPLY — 16 items
GLOVE BIO SURGEON STRL SZ8 (GLOVE) ×3 IMPLANT
GLOVE BIOGEL M 6.5 STRL (GLOVE) ×3 IMPLANT
GLOVE SURG LX 8.0 MICRO (GLOVE) ×2
GLOVE SURG LX STRL 8.0 MICRO (GLOVE) ×1 IMPLANT
GOWN STRL REUS W/ TWL LRG LVL3 (GOWN DISPOSABLE) ×2 IMPLANT
GOWN STRL REUS W/TWL LRG LVL3 (GOWN DISPOSABLE) ×4
LABEL CATARACT MEDS ST (LABEL) ×3 IMPLANT
LENS IOL TECNIS ITEC 25.5 (Intraocular Lens) ×3 IMPLANT
PACK CATARACT (MISCELLANEOUS) ×3 IMPLANT
PACK CATARACT BRASINGTON LX (MISCELLANEOUS) ×3 IMPLANT
PACK EYE AFTER SURG (MISCELLANEOUS) ×3 IMPLANT
SOL BSS BAG (MISCELLANEOUS) ×3
SOLUTION BSS BAG (MISCELLANEOUS) ×1 IMPLANT
SYR 5ML LL (SYRINGE) ×3 IMPLANT
WATER STERILE IRR 250ML POUR (IV SOLUTION) ×3 IMPLANT
WIPE NON LINTING 3.25X3.25 (MISCELLANEOUS) ×3 IMPLANT

## 2016-12-08 NOTE — Discharge Instructions (Signed)
Eye Surgery Discharge Instructions  Expect mild scratchy sensation or mild soreness. DO NOT RUB YOUR EYE!  The day of surgery:  Minimal physical activity, but bed rest is not required  No reading, computer work, or close hand work  No bending, lifting, or straining.  May watch TV  For 24 hours:  No driving, legal decisions, or alcoholic beverages  Safety precautions  Eat anything you prefer: It is better to start with liquids, then soup then solid foods.  _____ Eye patch should be worn until postoperative exam tomorrow.  ____ Solar shield eyeglasses should be worn for comfort in the sunlight/patch while sleeping  Resume all regular medications including aspirin or Coumadin if these were discontinued prior to surgery. You may shower, bathe, shave, or wash your hair. Tylenol may be taken for mild discomfort.  Call your doctor if you experience significant pain, nausea, or vomiting, fever > 101 or other signs of infection. [phone] or [phone] Specific instructions:  Follow-up Information    Birder Robson, MD Follow up.   Specialty:  Ophthalmology Why:  12/08/16 at 9:10 Contact information: [address] [place] [address] [phone]          Eye Surgery Discharge Instructions  Expect mild scratchy sensation or mild soreness. DO NOT RUB YOUR EYE!  The day of surgery:  Minimal physical activity, but bed rest is not required  No reading, computer work, or close hand work  No bending, lifting, or straining.  May watch TV  For 24 hours:  No driving, legal decisions, or alcoholic beverages  Safety precautions  Eat anything you prefer: It is better to start with liquids, then soup then solid foods.  _____ Eye patch should be worn until postoperative exam tomorrow.  ____ Solar shield eyeglasses should be worn for comfort in the sunlight/patch while sleeping  Resume all regular medications including aspirin or Coumadin if these were  discontinued prior to surgery. You may shower, bathe, shave, or wash your hair. Tylenol may be taken for mild discomfort.  Call your doctor if you experience significant pain, nausea, or vomiting, fever > 101 or other signs of infection. [phone] or [phone] Specific instructions:  Follow-up Information    Birder Robson, MD Follow up.   Specialty:  Ophthalmology Why:  12/08/16 at 9:10 Contact information: [place] [address] [phone]

## 2016-12-08 NOTE — Anesthesia Post-op Follow-up Note (Signed)
Anesthesia QCDR form completed.        

## 2016-12-08 NOTE — Anesthesia Procedure Notes (Signed)
Procedure Name: MAC Date/Time: 12/08/2016 7:34 AM Performed by: Nelda Marseille Pre-anesthesia Checklist: Patient identified, Emergency Drugs available, Suction available, Patient being monitored and Timeout performed Oxygen Delivery Method: Nasal cannula

## 2016-12-08 NOTE — Anesthesia Postprocedure Evaluation (Signed)
Anesthesia Post Note  Patient: Annette Quinn  Procedure(s) Performed: CATARACT EXTRACTION PHACO AND INTRAOCULAR LENS PLACEMENT (IOC)-RIGHT (Right Eye)  Patient location during evaluation: PACU Anesthesia Type: MAC Level of consciousness: awake, awake and alert and oriented Pain management: pain level controlled Vital Signs Assessment: post-procedure vital signs reviewed and stable Respiratory status: spontaneous breathing, nonlabored ventilation and respiratory function stable Cardiovascular status: stable Postop Assessment: no headache Anesthetic complications: no     Last Vitals:  Vitals:   12/08/16 0610 12/08/16 0753  BP:  (!) 102/56  Pulse:  (!) 56  Resp:  16  Temp:  37 C  SpO2: 97% 98%    Last Pain:  Vitals:   12/08/16 0609  TempSrc: Tympanic                 Niza Soderholm,  [facility]

## 2016-12-08 NOTE — Op Note (Signed)
PREOPERATIVE DIAGNOSIS:  Nuclear sclerotic cataract of the right eye.   POSTOPERATIVE DIAGNOSIS:  NUCLEAR SCLEROTIC CATARACT RIGHT EYE   OPERATIVE PROCEDURE: Procedure(s): CATARACT EXTRACTION PHACO AND INTRAOCULAR LENS PLACEMENT (IOC)-RIGHT   SURGEON:  Birder Robson, MD.   ANESTHESIA:  Anesthesiologist: Piscitello, Precious Haws, MD CRNA: Nelda Marseille, CRNA  1.      Managed anesthesia care. 2.      0.2ml of Shugarcaine was instilled in the eye following the paracentesis.   COMPLICATIONS:  None.   TECHNIQUE:   Stop and chop   DESCRIPTION OF PROCEDURE:  The patient was examined and consented in the preoperative holding area where the aforementioned topical anesthesia was applied to the right eye and then brought back to the Operating Room where the right eye was prepped and draped in the usual sterile ophthalmic fashion and a lid speculum was placed. A paracentesis was created with the side port blade and the anterior chamber was filled with viscoelastic. A near clear corneal incision was performed with the steel keratome. A continuous curvilinear capsulorrhexis was performed with a cystotome followed by the capsulorrhexis forceps. Hydrodissection and hydrodelineation were carried out with BSS on a blunt cannula. The lens was removed in a stop and chop  technique and the remaining cortical material was removed with the irrigation-aspiration handpiece. The capsular bag was inflated with viscoelastic and the Technis [id number]  lens was placed in the capsular bag without complication. The remaining viscoelastic was removed from the eye with the irrigation-aspiration handpiece. The wounds were hydrated. The anterior chamber was flushed with Miostat and the eye was inflated to physiologic pressure. 0.65ml of Vigamox was placed in the anterior chamber. The wounds were found to be water tight. The eye was dressed with Vigamox. The patient was given protective glasses to wear throughout the day and a shield with  which to sleep tonight. The patient was also given drops with which to begin a drop regimen today and will follow-up with me in one day.  Implant Name Type Inv. Item Serial No. Manufacturer Lot No. LRB No. Used  LENS IOL DIOP 25.5 - [id number] Intraocular Lens LENS IOL DIOP 25.5 [id number] AMO   Right 1   Procedure(s) with comments: CATARACT EXTRACTION PHACO AND INTRAOCULAR LENS PLACEMENT (IOC)-RIGHT (Right) - [place] 00:56.6 AP% 11.5 CDE 6.52 Fluid Pack lot # [id number]  Electronically signed: [place] 12/08/2016 7:51 AM

## 2016-12-08 NOTE — Transfer of Care (Signed)
Immediate Anesthesia Transfer of Care Note  Patient: Annette Quinn  Procedure(s) Performed: CATARACT EXTRACTION PHACO AND INTRAOCULAR LENS PLACEMENT (IOC)-RIGHT (Right Eye)  Patient Location: PACU  Anesthesia Type:MAC  Level of Consciousness: awake, alert  and oriented  Airway & Oxygen Therapy: Patient Spontanous Breathing  Post-op Assessment: Report given to RN and Post -op Vital signs reviewed and stable  Post vital signs: Reviewed and stable  Last Vitals:  Vitals:   12/08/16 0609 12/08/16 0610  BP: (!) 128/55   Pulse: (!) 57   Resp: 16   Temp: (!) 35.8 C   SpO2:  97%    Last Pain:  Vitals:   12/08/16 0609  TempSrc: Tympanic         Complications: No apparent anesthesia complications

## 2016-12-08 NOTE — Anesthesia Preprocedure Evaluation (Signed)
Anesthesia Evaluation  Patient identified by MRN, date of birth, ID band Patient awake    Reviewed: Allergy & Precautions, H&P , NPO status , Patient's Chart, lab work & pertinent test results  History of Anesthesia Complications (+) history of anesthetic complications  Airway Mallampati: III  TM Distance: <3 FB Neck ROM: limited    Dental  (+) Chipped, Poor Dentition, Upper Dentures, Lower Dentures, Edentulous Lower, Edentulous Upper   Pulmonary sleep apnea , COPD, former smoker,           Cardiovascular negative cardio ROS       Neuro/Psych PSYCHIATRIC DISORDERS Depression negative neurological ROS     GI/Hepatic Neg liver ROS, GERD  Medicated and Controlled,(+) Hepatitis -  Endo/Other  negative endocrine ROS  Renal/GU      Musculoskeletal  (+) Arthritis ,   Abdominal   Peds  Hematology negative hematology ROS (+)   Anesthesia Other Findings Past Medical History: No date: Adopted No date: Arthritis     Comment:  osteoarthritis 1975: Blood clot in vein No date: Cancer ([place]) No date: Complication of anesthesia     Comment:  body aches after anesthesia No date: Depression No date: Diverticulosis No date: GERD (gastroesophageal reflux disease) No date: Hepatitis No date: History of bronchitis No date: Hypercholesterolemia No date: Motion sickness     Comment:  curvy/hilly roads No date: Neuropathy     Comment:  upper legs No date: Osteoporosis No date: Skin cancer (melanoma) ([place])     Comment:  left upper thigh No date: Skin cancer of face     Comment:  basal cell No date: Sleep apnea     Comment:  CPAP No date: Tremor No date: Wears dentures     Comment:  full upper and lower  Past Surgical History: 1979: ABDOMINAL HYSTERECTOMY 1992: BLADDER SUSPENSION 2010: BREAST BIOPSY; Left     Comment:  benign 2010, 2015: COLONOSCOPY 1979: DILATION AND CURETTAGE OF UTERUS 08/26/2016: DISTAL  INTERPHALANGEAL JOINT FUSION; Left     Comment:  Procedure: DISTAL INTERPHALANGEAL JOINT FUSION-LEFT               INDEX FINGER;  Surgeon: Hessie Knows, MD;  Location:               [facility];  Service: Orthopedics;  Laterality: Left; 1989: HAMMER TOE SURGERY; Left 03/31/2016: HAMMER TOE SURGERY; Right     Comment:  Procedure: HAMMER TOE CORRECTION RIGHT T6 T7;  Surgeon:               Samara Deist, DPM;  Location: [facility];                Service: Podiatry;  Laterality: Right;  sleep apnea 03/05/2014: KNEE ARTHROSCOPY W/ PARTIAL MEDIAL MENISCECTOMY; Right     Comment:  medial and lateral.  Abrasion hondroplasty of medial               femoral condyle and femorla trochlea. No date: NASAL SEPTUM SURGERY 1973: TONSILLECTOMY No date: TRIGGER FINGER RELEASE; Left     Comment:  thumb No date: VEIN LIGATION 03/31/2016: WEIL OSTEOTOMY; Right     Comment:  Procedure: WEIL OSTEOTOMY X2 RIGHT FOOT;  Surgeon:               Samara Deist, DPM;  Location: [facility];                Service: Podiatry;  Laterality: Right;  IV WITH POPLITEAL  BMI  Body Mass Index:  38.27 kg/m      Reproductive/Obstetrics negative OB ROS                             Anesthesia Physical Anesthesia Plan  ASA: III  Anesthesia Plan: MAC   Post-op Pain Management:    Induction: Intravenous  PONV Risk Score and Plan:   Airway Management Planned: Natural Airway and Nasal Cannula  Additional Equipment:   Intra-op Plan:   Post-operative Plan:   Informed Consent: I have reviewed the patients History and Physical, chart, labs and discussed the procedure including the risks, benefits and alternatives for the proposed anesthesia with the patient or authorized representative who has indicated his/her understanding and acceptance.   Dental Advisory Given  Plan Discussed with: Anesthesiologist, CRNA and Surgeon  Anesthesia Plan Comments: (Patient consented for risks  of anesthesia including but not limited to:  - adverse reactions to medications - damage to teeth, lips or other oral mucosa - sore throat or hoarseness - Damage to heart, brain, lungs or loss of life  Patient voiced understanding.)        Anesthesia Quick Evaluation

## 2016-12-08 NOTE — H&P (Signed)
All labs reviewed. Abnormal studies sent to patients PCP when indicated.  Previous H&P reviewed, patient examined, there are NO CHANGES.  Annette Salonga LOUIS10/31/20187:15 AM

## 2017-01-03 ENCOUNTER — Encounter: Payer: Self-pay | Admitting: *Deleted

## 2017-01-04 ENCOUNTER — Ambulatory Visit: Payer: 59 | Admitting: Anesthesiology

## 2017-01-04 ENCOUNTER — Other Ambulatory Visit: Payer: Self-pay

## 2017-01-04 ENCOUNTER — Ambulatory Visit
Admission: RE | Admit: 2017-01-04 | Discharge: 2017-01-04 | Disposition: A | Discharge status: Home / Self Care | Payer: 59 | Source: Ambulatory Visit | Attending: Ophthalmology | Admitting: Ophthalmology

## 2017-01-04 ENCOUNTER — Encounter: Payer: Self-pay | Admitting: *Deleted

## 2017-01-04 ENCOUNTER — Encounter
Admission: RE | Disposition: A | Discharge status: Home / Self Care | Payer: Self-pay | Source: Ambulatory Visit | Attending: Ophthalmology

## 2017-01-04 DIAGNOSIS — Z85828 Personal history of other malignant neoplasm of skin: Secondary | ICD-10-CM | POA: Insufficient documentation

## 2017-01-04 DIAGNOSIS — H2512 Age-related nuclear cataract, left eye: Secondary | ICD-10-CM | POA: Insufficient documentation

## 2017-01-04 DIAGNOSIS — J449 Chronic obstructive pulmonary disease, unspecified: Secondary | ICD-10-CM | POA: Insufficient documentation

## 2017-01-04 DIAGNOSIS — M1990 Unspecified osteoarthritis, unspecified site: Secondary | ICD-10-CM | POA: Diagnosis not present

## 2017-01-04 DIAGNOSIS — G4730 Sleep apnea, unspecified: Secondary | ICD-10-CM | POA: Insufficient documentation

## 2017-01-04 DIAGNOSIS — Z79899 Other long term (current) drug therapy: Secondary | ICD-10-CM | POA: Insufficient documentation

## 2017-01-04 DIAGNOSIS — E7800 Pure hypercholesterolemia, unspecified: Secondary | ICD-10-CM | POA: Insufficient documentation

## 2017-01-04 DIAGNOSIS — K219 Gastro-esophageal reflux disease without esophagitis: Secondary | ICD-10-CM | POA: Insufficient documentation

## 2017-01-04 DIAGNOSIS — Z87891 Personal history of nicotine dependence: Secondary | ICD-10-CM | POA: Insufficient documentation

## 2017-01-04 DIAGNOSIS — G4700 Insomnia, unspecified: Secondary | ICD-10-CM | POA: Insufficient documentation

## 2017-01-04 DIAGNOSIS — F329 Major depressive disorder, single episode, unspecified: Secondary | ICD-10-CM | POA: Diagnosis not present

## 2017-01-04 HISTORY — PX: CATARACT EXTRACTION W/PHACO: SHX586

## 2017-01-04 SURGERY — PHACOEMULSIFICATION, CATARACT, WITH IOL INSERTION
Anesthesia: Monitor Anesthesia Care | Site: Eye | Laterality: Left | Wound class: Clean

## 2017-01-04 MED ORDER — MIDAZOLAM HCL 2 MG/2ML IJ SOLN
INTRAMUSCULAR | Status: DC | PRN
  Administered 2017-01-04 (×2): 1 mg via INTRAVENOUS

## 2017-01-04 MED ORDER — ARMC OPHTHALMIC DILATING DROPS
1.0000 "application " | OPHTHALMIC | Status: AC
  Administered 2017-01-04 (×3): 1 via OPHTHALMIC

## 2017-01-04 MED ORDER — MOXIFLOXACIN HCL 0.5 % OP SOLN
OPHTHALMIC | Status: AC
  Filled 2017-01-04: qty 3

## 2017-01-04 MED ORDER — LIDOCAINE HCL (PF) 4 % IJ SOLN
INTRAMUSCULAR | Status: DC | PRN
  Administered 2017-01-04: 4 mL via OPHTHALMIC

## 2017-01-04 MED ORDER — ARMC OPHTHALMIC DILATING DROPS
OPHTHALMIC | Status: AC
  Administered 2017-01-04: 1 via OPHTHALMIC
  Filled 2017-01-04: qty 0.4

## 2017-01-04 MED ORDER — MOXIFLOXACIN HCL 0.5 % OP SOLN
1.0000 [drp] | OPHTHALMIC | Status: DC | PRN
Start: 2017-01-04 — End: 2017-01-04

## 2017-01-04 MED ORDER — CARBACHOL 0.01 % IO SOLN
INTRAOCULAR | Status: DC | PRN
  Administered 2017-01-04: 0.5 mL via INTRAOCULAR

## 2017-01-04 MED ORDER — SODIUM CHLORIDE 0.9 % IV SOLN
INTRAVENOUS | Status: DC
  Administered 2017-01-04: 09:00:00 via INTRAVENOUS

## 2017-01-04 MED ORDER — MIDAZOLAM HCL 2 MG/2ML IJ SOLN
INTRAMUSCULAR | Status: AC
  Filled 2017-01-04: qty 2

## 2017-01-04 MED ORDER — NA CHONDROIT SULF-NA HYALURON 40-17 MG/ML IO SOLN
INTRAOCULAR | Status: DC | PRN
  Administered 2017-01-04: 1 mL via INTRAOCULAR

## 2017-01-04 MED ORDER — MOXIFLOXACIN HCL 0.5 % OP SOLN
OPHTHALMIC | Status: DC | PRN
  Administered 2017-01-04: 0.2 mL via OPHTHALMIC

## 2017-01-04 MED ORDER — POVIDONE-IODINE 5 % OP SOLN
OPHTHALMIC | Status: DC | PRN
  Administered 2017-01-04: 1 via OPHTHALMIC

## 2017-01-04 MED ORDER — EPINEPHRINE PF 1 MG/ML IJ SOLN
INTRAMUSCULAR | Status: DC | PRN
  Administered 2017-01-04: 10:00:00 via OPHTHALMIC

## 2017-01-04 SURGICAL SUPPLY — 16 items
GLOVE BIO SURGEON STRL SZ8 (GLOVE) ×2 IMPLANT
GLOVE BIOGEL M 6.5 STRL (GLOVE) ×2 IMPLANT
GLOVE SURG LX 8.0 MICRO (GLOVE) ×1
GLOVE SURG LX STRL 8.0 MICRO (GLOVE) ×1 IMPLANT
GOWN STRL REUS W/ TWL LRG LVL3 (GOWN DISPOSABLE) ×2 IMPLANT
GOWN STRL REUS W/TWL LRG LVL3 (GOWN DISPOSABLE) ×2
LABEL CATARACT MEDS ST (LABEL) ×2 IMPLANT
LENS IOL TECNIS ITEC 25.5 (Intraocular Lens) ×2 IMPLANT
PACK CATARACT (MISCELLANEOUS) ×2 IMPLANT
PACK CATARACT BRASINGTON LX (MISCELLANEOUS) ×2 IMPLANT
PACK EYE AFTER SURG (MISCELLANEOUS) ×2 IMPLANT
SOL BSS BAG (MISCELLANEOUS) ×2
SOLUTION BSS BAG (MISCELLANEOUS) ×1 IMPLANT
SYR 5ML LL (SYRINGE) ×2 IMPLANT
WATER STERILE IRR 250ML POUR (IV SOLUTION) ×2 IMPLANT
WIPE NON LINTING 3.25X3.25 (MISCELLANEOUS) ×2 IMPLANT

## 2017-01-04 NOTE — Transfer of Care (Signed)
Immediate Anesthesia Transfer of Care Note  Patient: Annette Quinn  Procedure(s) Performed: CATARACT EXTRACTION PHACO AND INTRAOCULAR LENS PLACEMENT (IOC) (Left Eye)  Patient Location: Short Stay  Anesthesia Type:MAC  Level of Consciousness: awake, alert  and oriented  Airway & Oxygen Therapy: Patient Spontanous Breathing  Post-op Assessment: Report given to RN and Post -op Vital signs reviewed and stable  Post vital signs: Reviewed and stable  Last Vitals:  Vitals:   01/04/17 0854 01/04/17 1013  BP: (!) 120/56 (!) 115/48  Pulse: (!) 54 (!) 56  Resp: 16 16  Temp: (!) 35.7 C   SpO2: 100% 99%    Last Pain:  Vitals:   01/04/17 0854  TempSrc: Tympanic         Complications: No apparent anesthesia complications

## 2017-01-04 NOTE — H&P (Signed)
All labs reviewed. Abnormal studies sent to patients PCP when indicated.  Previous H&P reviewed, patient examined, there are NO CHANGES.  Tomma Ehinger LOUIS11/27/20189:46 AM

## 2017-01-04 NOTE — Discharge Instructions (Signed)
Eye Surgery Discharge Instructions  Expect mild scratchy sensation or mild soreness. DO NOT RUB YOUR EYE!  The day of surgery:  Minimal physical activity, but bed rest is not required  No reading, computer work, or close hand work  No bending, lifting, or straining.  May watch TV  For 24 hours:  No driving, legal decisions, or alcoholic beverages  Safety precautions  Eat anything you prefer: It is better to start with liquids, then soup then solid foods.  _____ Eye patch should be worn until postoperative exam tomorrow.  ____ Solar shield eyeglasses should be worn for comfort in the sunlight/patch while sleeping  Resume all regular medications including aspirin or Coumadin if these were discontinued prior to surgery. You may shower, bathe, shave, or wash your hair. Tylenol may be taken for mild discomfort.  Call your doctor if you experience significant pain, nausea, or vomiting, fever > 101 or other signs of infection. [phone] or [phone] Specific instructions:  Follow-up Information    Birder Robson, MD Follow up.   Specialty:  Ophthalmology Why:  November 28 at 8:50am Contact information: [address] [place] [address] [phone]

## 2017-01-04 NOTE — Anesthesia Preprocedure Evaluation (Signed)
Anesthesia Evaluation  Patient identified by MRN, date of birth, ID band Patient awake    Reviewed: Allergy & Precautions, H&P , NPO status , Patient's Chart, lab work & pertinent test results  History of Anesthesia Complications (+) history of anesthetic complications  Airway Mallampati: III  TM Distance: <3 FB Neck ROM: limited    Dental  (+) Chipped, Poor Dentition, Upper Dentures, Lower Dentures, Edentulous Lower, Edentulous Upper   Pulmonary sleep apnea , COPD, former smoker,           Cardiovascular negative cardio ROS       Neuro/Psych PSYCHIATRIC DISORDERS negative neurological ROS     GI/Hepatic Neg liver ROS, GERD  Medicated and Controlled,(+) Hepatitis -  Endo/Other  negative endocrine ROS  Renal/GU      Musculoskeletal  (+) Arthritis ,   Abdominal   Peds  Hematology negative hematology ROS (+)   Anesthesia Other Findings Past Medical History: No date: Adopted No date: Arthritis     Comment:  osteoarthritis 1975: Blood clot in vein No date: Cancer ([place]) No date: Complication of anesthesia     Comment:  body aches after anesthesia No date: Depression No date: Diverticulosis No date: GERD (gastroesophageal reflux disease) No date: Hepatitis No date: History of bronchitis No date: Hypercholesterolemia No date: Motion sickness     Comment:  curvy/hilly roads No date: Neuropathy     Comment:  upper legs No date: Osteoporosis No date: Skin cancer (melanoma) ([place])     Comment:  left upper thigh No date: Skin cancer of face     Comment:  basal cell No date: Sleep apnea     Comment:  CPAP No date: Tremor No date: Wears dentures     Comment:  full upper and lower  Past Surgical History: 1979: ABDOMINAL HYSTERECTOMY 1992: BLADDER SUSPENSION 2010: BREAST BIOPSY; Left     Comment:  benign 2010, 2015: COLONOSCOPY 1979: DILATION AND CURETTAGE OF UTERUS 08/26/2016: DISTAL INTERPHALANGEAL JOINT  FUSION; Left     Comment:  Procedure: DISTAL INTERPHALANGEAL JOINT FUSION-LEFT               INDEX FINGER;  Surgeon: Hessie Knows, MD;  Location:               [facility];  Service: Orthopedics;  Laterality: Left; 1989: HAMMER TOE SURGERY; Left 03/31/2016: HAMMER TOE SURGERY; Right     Comment:  Procedure: HAMMER TOE CORRECTION RIGHT T6 T7;  Surgeon:               Samara Deist, DPM;  Location: [place];                Service: Podiatry;  Laterality: Right;  sleep apnea 03/05/2014: KNEE ARTHROSCOPY W/ PARTIAL MEDIAL MENISCECTOMY; Right     Comment:  medial and lateral.  Abrasion hondroplasty of medial               femoral condyle and femorla trochlea. No date: NASAL SEPTUM SURGERY 1973: TONSILLECTOMY No date: TRIGGER FINGER RELEASE; Left     Comment:  thumb No date: VEIN LIGATION 03/31/2016: WEIL OSTEOTOMY; Right     Comment:  Procedure: WEIL OSTEOTOMY X2 RIGHT FOOT;  Surgeon:               Samara Deist, DPM;  Location: [place];                Service: Podiatry;  Laterality: Right;  IV WITH POPLITEAL  BMI  Body Mass Index:  38.27 kg/m      Reproductive/Obstetrics negative OB ROS                             Anesthesia Physical  Anesthesia Plan  ASA: III  Anesthesia Plan: MAC   Post-op Pain Management:    Induction: Intravenous  PONV Risk Score and Plan:   Airway Management Planned: Natural Airway and Nasal Cannula  Additional Equipment:   Intra-op Plan:   Post-operative Plan:   Informed Consent: I have reviewed the patients History and Physical, chart, labs and discussed the procedure including the risks, benefits and alternatives for the proposed anesthesia with the patient or authorized representative who has indicated his/her understanding and acceptance.   Dental Advisory Given  Plan Discussed with: Anesthesiologist, CRNA and Surgeon  Anesthesia Plan Comments: (Patient consented for risks of anesthesia  including but not limited to:  - adverse reactions to medications - damage to teeth, lips or other oral mucosa - sore throat or hoarseness - Damage to heart, brain, lungs or loss of life  Patient voiced understanding.)        Anesthesia Quick Evaluation

## 2017-01-04 NOTE — Op Note (Signed)
PREOPERATIVE DIAGNOSIS:  Nuclear sclerotic cataract of the left eye.   POSTOPERATIVE DIAGNOSIS:  Nuclear sclerotic cataract of the left eye.   OPERATIVE PROCEDURE: Procedure(s): CATARACT EXTRACTION PHACO AND INTRAOCULAR LENS PLACEMENT (IOC)   SURGEON:  Birder Robson, MD.   ANESTHESIA:  Anesthesiologist: Martha Clan, MD CRNA: Jonna Clark, CRNA  1.      Managed anesthesia care. 2.     0.89ml of Shugarcaine was instilled following the paracentesis   COMPLICATIONS:  None.   TECHNIQUE:   Stop and chop   DESCRIPTION OF PROCEDURE:  The patient was examined and consented in the preoperative holding area where the aforementioned topical anesthesia was applied to the left eye and then brought back to the Operating Room where the left eye was prepped and draped in the usual sterile ophthalmic fashion and a lid speculum was placed. A paracentesis was created with the side port blade and the anterior chamber was filled with viscoelastic. A near clear corneal incision was performed with the steel keratome. A continuous curvilinear capsulorrhexis was performed with a cystotome followed by the capsulorrhexis forceps. Hydrodissection and hydrodelineation were carried out with BSS on a blunt cannula. The lens was removed in a stop and chop  technique and the remaining cortical material was removed with the irrigation-aspiration handpiece. The capsular bag was inflated with viscoelastic and the Technis [id number] lens was placed in the capsular bag without complication. The remaining viscoelastic was removed from the eye with the irrigation-aspiration handpiece. The wounds were hydrated. The anterior chamber was flushed with Miostat and the eye was inflated to physiologic pressure. 0.80ml Vigamox was placed in the anterior chamber. The wounds were found to be water tight. The eye was dressed with Vigamox. The patient was given protective glasses to wear throughout the day and a shield with which to sleep  tonight. The patient was also given drops with which to begin a drop regimen today and will follow-up with me in one day. Implant Name Type Inv. Item Serial No. Manufacturer Lot No. LRB No. Used  LENS IOL DIOP 25.5 - [id number] Intraocular Lens LENS IOL DIOP 25.5 [id number] AMO  Left 1    Procedure(s) with comments: CATARACT EXTRACTION PHACO AND INTRAOCULAR LENS PLACEMENT (IOC) (Left) - [place] 00:33.9 AP% 14.5 CDE 4.91 Fluid Pack lot # [id number]  Electronically signed: [place] 01/04/2017 10:11 AM

## 2017-01-04 NOTE — Anesthesia Postprocedure Evaluation (Signed)
Anesthesia Post Note  Patient: Annette Quinn  Procedure(s) Performed: CATARACT EXTRACTION PHACO AND INTRAOCULAR LENS PLACEMENT ([place]) (Left Eye)  Patient location during evaluation: Short Stay Anesthesia Type: MAC Level of consciousness: awake and alert and oriented Pain management: pain level controlled Vital Signs Assessment: post-procedure vital signs reviewed and stable Respiratory status: spontaneous breathing Cardiovascular status: stable Postop Assessment: no headache, no apparent nausea or vomiting and adequate PO intake Anesthetic complications: no     Last Vitals:  Vitals:   01/04/17 1009 01/04/17 1013  BP: (!) 115/48 (!) 115/48  Pulse:  (!) 56  Resp: 16 16  Temp: (!) 36.4 C   SpO2: 98% 99%    Last Pain:  Vitals:   01/04/17 0854  TempSrc: Tympanic                 Lanora Manis

## 2017-01-04 NOTE — Anesthesia Post-op Follow-up Note (Signed)
Anesthesia QCDR form completed.        

## 2017-01-13 ENCOUNTER — Emergency Department
Admission: EM | Admit: 2017-01-13 | Discharge: 2017-01-13 | Disposition: A | Discharge status: Home / Self Care | Payer: 59 | Attending: Student in an Organized Health Care Education/Training Program | Admitting: Student in an Organized Health Care Education/Training Program

## 2017-01-13 ENCOUNTER — Encounter: Payer: Self-pay | Admitting: Emergency Medicine

## 2017-01-13 ENCOUNTER — Emergency Department: Payer: 59

## 2017-01-13 ENCOUNTER — Other Ambulatory Visit: Payer: Self-pay

## 2017-01-13 DIAGNOSIS — Z79899 Other long term (current) drug therapy: Secondary | ICD-10-CM | POA: Diagnosis not present

## 2017-01-13 DIAGNOSIS — W108XXA Fall (on) (from) other stairs and steps, initial encounter: Secondary | ICD-10-CM | POA: Diagnosis not present

## 2017-01-13 DIAGNOSIS — Y999 Unspecified external cause status: Secondary | ICD-10-CM | POA: Insufficient documentation

## 2017-01-13 DIAGNOSIS — W19XXXA Unspecified fall, initial encounter: Secondary | ICD-10-CM

## 2017-01-13 DIAGNOSIS — Y9301 Activity, walking, marching and hiking: Secondary | ICD-10-CM | POA: Diagnosis not present

## 2017-01-13 DIAGNOSIS — S42222A 2-part displaced fracture of surgical neck of left humerus, initial encounter for closed fracture: Secondary | ICD-10-CM | POA: Diagnosis not present

## 2017-01-13 DIAGNOSIS — Z87891 Personal history of nicotine dependence: Secondary | ICD-10-CM | POA: Insufficient documentation

## 2017-01-13 DIAGNOSIS — S4992XA Unspecified injury of left shoulder and upper arm, initial encounter: Secondary | ICD-10-CM | POA: Diagnosis present

## 2017-01-13 DIAGNOSIS — Y929 Unspecified place or not applicable: Secondary | ICD-10-CM | POA: Diagnosis not present

## 2017-01-13 DIAGNOSIS — S42292A Other displaced fracture of upper end of left humerus, initial encounter for closed fracture: Secondary | ICD-10-CM

## 2017-01-13 MED ORDER — HYDROMORPHONE HCL 1 MG/ML IJ SOLN
1.0000 mg | Freq: Once | INTRAMUSCULAR | Status: AC
  Administered 2017-01-13: 1 mg via INTRAVENOUS
  Filled 2017-01-13: qty 1

## 2017-01-13 MED ORDER — FENTANYL CITRATE (PF) 100 MCG/2ML IJ SOLN
100.0000 ug | Freq: Once | INTRAMUSCULAR | Status: AC
  Administered 2017-01-13: 100 ug via INTRAVENOUS
  Filled 2017-01-13: qty 2

## 2017-01-13 MED ORDER — SODIUM CHLORIDE 0.9 % IV BOLUS (SEPSIS)
1000.0000 mL | Freq: Once | INTRAVENOUS | Status: AC
  Administered 2017-01-13: 1000 mL via INTRAVENOUS

## 2017-01-13 MED ORDER — OXYCODONE-ACETAMINOPHEN 5-325 MG PO TABS: 1.0000 | ORAL_TABLET | Freq: Four times a day (QID) | ORAL | 0 refills | Status: DC | PRN

## 2017-01-13 MED ORDER — OXYCODONE-ACETAMINOPHEN 5-325 MG PO TABS
1.0000 | ORAL_TABLET | Freq: Once | ORAL | Status: AC
  Administered 2017-01-13: 1 via ORAL
  Filled 2017-01-13: qty 1

## 2017-01-13 MED ORDER — ONDANSETRON HCL 4 MG/2ML IJ SOLN
4.0000 mg | Freq: Once | INTRAMUSCULAR | Status: AC
  Administered 2017-01-13: 4 mg via INTRAVENOUS
  Filled 2017-01-13: qty 2

## 2017-01-13 MED ORDER — ONDANSETRON 8 MG PO TBDP
8.0000 mg | ORAL_TABLET | Freq: Once | ORAL | Status: AC
Start: 2017-01-13 — End: 2017-01-13
  Administered 2017-01-13: 8 mg via ORAL
  Filled 2017-01-13: qty 1

## 2017-01-13 NOTE — ED Provider Notes (Signed)
[facility] Emergency Department Provider Note  ____________________________________________  Time seen: Approximately 4:22 PM  I have reviewed the triage vital signs and the nursing notes.   HISTORY  Chief Complaint Fall    HPI Annette Quinn is a 66 y.o. female who presents emergency department status post mechanical fall.  Patient states that she was trying to walk up a flight of stairs when she tripped.  Patient reports that the tripping motion caught her off guard, causing her to fall without much protection of the fall.  Patient reports that she tried to extend the left arm but it caught underneath her.  Patient reports that she landed on her elbow and left shoulder.  Primarily her pain extends from the shoulder to the mid forearm.  Patient has loss of range of motion due to pain.  She did not hit her head or lose consciousness.  She denies any headache, visual changes, neck pain, chest pain, shortness of breath, abdominal pain, nausea or vomiting.  No medications for pain prior to arrival.  Patient denies any numbness and tingling in the left hand or left upper extremity.  She denies any lacerations.  No other injury or complaint.  Past Medical History:  Diagnosis Date  . Adopted   . Arthritis    osteoarthritis  . Blood clot in vein 1975  . Cancer ([place])   . Complication of anesthesia    body aches after anesthesia  . Depression   . Diverticulosis   . GERD (gastroesophageal reflux disease)   . Hepatitis   . History of bronchitis   . Hypercholesterolemia   . Motion sickness    curvy/hilly roads  . Neuropathy    upper legs  . Osteoporosis   . Skin cancer (melanoma) ([place])    left upper thigh  . Skin cancer of face    basal cell  . Sleep apnea    CPAP  . Tremor   . Wears dentures    full upper and lower    There are no active problems to display for this patient.   Past Surgical History:  Procedure Laterality Date  . ABDOMINAL HYSTERECTOMY   1979  . BLADDER SUSPENSION  1992  . BREAST BIOPSY Left 2010   benign  . CATARACT EXTRACTION W/PHACO Right 12/08/2016   Procedure: CATARACT EXTRACTION PHACO AND INTRAOCULAR LENS PLACEMENT (IOC)-RIGHT;  Surgeon: Birder Robson, MD;  Location: [facility];  Service: Ophthalmology;  Laterality: Right;  [place] 00:56.6 AP% 11.5 CDE 6.52 Fluid Pack lot # [id number]  . CATARACT EXTRACTION W/PHACO Left 01/04/2017   Procedure: CATARACT EXTRACTION PHACO AND INTRAOCULAR LENS PLACEMENT (IOC);  Surgeon: Birder Robson, MD;  Location: [facility];  Service: Ophthalmology;  Laterality: Left;  [place] 00:33.9 AP% 14.5 CDE 4.91 Fluid Pack lot # [id number]  . COLONOSCOPY  2010, 2015  . DILATION AND CURETTAGE OF UTERUS  1979  . DISTAL INTERPHALANGEAL JOINT FUSION Left 08/26/2016   Procedure: DISTAL INTERPHALANGEAL JOINT FUSION-LEFT INDEX FINGER;  Surgeon: Hessie Knows, MD;  Location: [facility];  Service: Orthopedics;  Laterality: Left;  . HAMMER TOE SURGERY Left 1989  . HAMMER TOE SURGERY Right 03/31/2016   Procedure: HAMMER TOE CORRECTION RIGHT T6 T7;  Surgeon: Samara Deist, DPM;  Location: [place];  Service: Podiatry;  Laterality: Right;  sleep apnea  . KNEE ARTHROSCOPY W/ PARTIAL MEDIAL MENISCECTOMY Right 03/05/2014   medial and lateral.  Abrasion hondroplasty of medial femoral condyle and femorla trochlea.  [facility] NASAL SEPTUM SURGERY    .  TONSILLECTOMY  1973  . TRIGGER FINGER RELEASE Left    thumb  . VEIN LIGATION    . WEIL OSTEOTOMY Right 03/31/2016   Procedure: WEIL OSTEOTOMY X2 RIGHT FOOT;  Surgeon: Samara Deist, DPM;  Location: [place];  Service: Podiatry;  Laterality: Right;  IV WITH POPLITEAL    Prior to Admission medications   Medication Sig Start Date End Date Taking? Authorizing Provider  acetaminophen (TYLENOL) 650 MG CR tablet Take 650-1,300 mg by mouth every 8 (eight) hours as needed for pain.     [provider]  albuterol (PROVENTIL HFA;VENTOLIN HFA) 108 (90 Base)  MCG/ACT inhaler Inhale 2 puffs into the lungs every 6 (six) hours as needed for wheezing or shortness of breath.     [provider]  Azelastine-Fluticasone 137-50 MCG/ACT SUSP Place 1 spray into the nose 2 (two) times daily as needed (for allergies.).     [provider]  Betamethasone Valerate 0.12 % foam Apply 1 application topically as needed (eczema).  11/01/16   [provider]  bumetanide (BUMEX) 1 MG tablet Take 1 mg by mouth 2 (two) times daily.     [provider]  busPIRone (BUSPAR) 7.5 MG tablet Take 7.5 mg by mouth 2 (two) times daily.    [provider]  CALCIUM PO Take 1 tablet by mouth daily.    [provider]  Cholecalciferol 2000 units CAPS Take 2,000 Units by mouth daily before breakfast.     [provider]  diclofenac sodium (VOLTAREN) 1 % GEL Apply 2-4 g topically 4 (four) times daily as needed (for pain.).     [provider]  EPINEPHrine (AUVI-Q) 0.3 mg/0.3 mL IJ SOAJ injection Inject 0.3 mg into the muscle daily as needed (for anaphylatic allergic reaction).    [provider]  fenofibrate 160 MG tablet Take 160 mg by mouth daily at 12 noon.  08/01/16   [provider]  Fluocinolone Acetonide 0.01 % OIL Apply 1 application topically as needed (eczema).  11/01/16   [provider]  gabapentin (NEURONTIN) 300 MG capsule Take 600 mg by mouth 3 (three) times daily.  07/26/16   [provider]  GLUCOSAMINE-CHONDROITIN PO Take 1 tablet by mouth daily.    [provider]  hydrocortisone 2.5 % cream Apply 1 application topically 2 (two) times daily as needed ((typically uses once daily)). Mix equal parts with ketoconazole 2% applied affected area(s) on face    [provider]  ketoconazole (NIZORAL) 2 % cream Apply 1 application topically 2 (two) times daily as needed for irritation ((typically uses once daily)). Mix equal parts with hydrocortisone 2.5% applied  affected area(s) on face    [provider]  montelukast (SINGULAIR) 10 MG tablet Take 10 mg by mouth every morning.     [provider]  oxyCODONE-acetaminophen (ROXICET) 5-325 MG tablet Take 1 tablet by mouth every 6 (six) hours as needed for severe pain. 01/13/17   Deborah Lazcano, Charline Bills, PA-C  pantoprazole (PROTONIX) 40 MG tablet Take 40 mg by mouth See admin instructions. Take 1 tablet (40 mg) scheduled every morning; may take additional dose later if needed    [provider]  propranolol (INDERAL) 20 MG tablet Take 20 mg by mouth 2 (two) times daily.    [provider]  raloxifene (EVISTA) 60 MG tablet Take 60 mg by mouth daily at 12 noon.     [provider]  sertraline (ZOLOFT) 100 MG tablet Take 200 mg  by mouth daily with lunch.     [provider]    Allergies Actonel [risedronate sodium]; Boniva [ibandronic acid]; Fosamax [alendronate]; Lopid [gemfibrozil]; Penicillins; Reclast [zoledronic acid]; Strawberry (diagnostic); Tape; and Doxycycline  Family History  Adopted: Yes    Social History Social History   Tobacco Use  . Smoking status: Former Smoker    Years: 43.00    Last attempt to quit: 07/20/2010    Years since quitting: 6.4  . Smokeless tobacco: Never Used  Substance Use Topics  . Alcohol use: No  . Drug use: No     Review of Systems  Constitutional: No fever/chills Eyes: No visual changes.  Cardiovascular: no chest pain. Respiratory: no cough. No SOB. Gastrointestinal: No abdominal pain.  No nausea, no vomiting.  Musculoskeletal: Positive for left shoulder, left upper arm, left elbow, left forearm pain Skin: Negative for rash, abrasions, lacerations, ecchymosis. Neurological: Negative for headaches, focal weakness or numbness. 10-point ROS otherwise negative.  ____________________________________________   PHYSICAL EXAM:  VITAL SIGNS: ED Triage Vitals [01/13/17 1615]  Enc Vitals Group     BP (!)  149/55     Pulse Rate 73     Resp 16     Temp (!) 97.5 F (36.4 C)     Temp Source Oral     SpO2 100 %     Weight      Height      Head Circumference      Peak Flow      Pain Score 10     Pain Loc      Pain Edu?      Excl. in [place]?      Constitutional: Alert and oriented. Well appearing and in no acute distress. Eyes: Conjunctivae are normal. PERRL. EOMI. Head: Atraumatic. ENT:      Ears:       Nose: No congestion/rhinnorhea.      Mouth/Throat: Mucous membranes are moist.  Neck: No stridor.  No cervical spine tenderness to palpation.  Cardiovascular: Normal rate, regular rhythm. Normal S1 and S2.  Good peripheral circulation. Respiratory: Normal respiratory effort without tachypnea or retractions. Lungs CTAB. Good air entry to the bases with no decreased or absent breath sounds. Musculoskeletal: Limited range of motion to left upper extremity.  No gross deformities appreciated.  Examination of the left shoulder reveals no gross deformity or edema.  Patient is nontender palpation of the posterior aspect.  Patient is tender to palpation along the mid to lateral aspect of the left clavicle, and the left lateral shoulder joint.  No palpable abnormality.  Patient is diffusely tender to palpation over the lateral shoulder joint through to the mid left humerus.  No palpable abnormality.  No gross deformity noted to the left elbow.  Limited range of motion in extension, flexion, supination and pronation due to pain.  Patient is tender over bilateral epicondyles and olecranon process.  No palpable abnormality.  Patient is tender along both the proximal ulna and radius.  No palpable abnormality.  No tenderness to palpation over the distal half of the ulna and radius.  Full motion of the left wrist.  No tenderness to palpation over the osseous truck radial pulse intact.  Sensation intact all 5 digits.  Capillary refill less than 2 seconds all digits. Neurologic:  Normal speech and language. No gross  focal neurologic deficits are appreciated.  Skin:  Skin is warm, dry and intact. No rash noted. Psychiatric: Mood and affect are normal. Speech and behavior are normal.  Patient exhibits appropriate insight and judgement.   ____________________________________________   LABS (all labs ordered are listed, but only abnormal results are displayed)  Labs Reviewed - No data to display ____________________________________________  EKG   ____________________________________________  RADIOLOGY Diamantina [place] Tillmon Kisling, personally viewed and evaluated these images (plain radiographs) as part of my medical decision making, as well as reviewing the written report by the radiologist.  Dg Elbow 2 Views Left  Result Date: 01/13/2017 CLINICAL DATA:  66 year old female status post trip and fall with severe pain. EXAM: LEFT ELBOW - 2 VIEW COMPARISON:  Left shoulder series today reported separately. FINDINGS: The lateral view is oblique. No definite joint effusion. The distal left humerus appears intact. Joint spaces and alignment are preserved at the left elbow. Proximal radius and ulna appear intact. IMPRESSION: No acute fracture or dislocation identified about the left elbow. Electronically Signed   By: Genevie Ann M.D.   On: 01/13/2017 17:32   Ct Shoulder Left Wo Contrast  Result Date: 01/13/2017 CLINICAL DATA:  Left shoulder fracture. EXAM: CT OF THE UPPER LEFT EXTREMITY WITHOUT CONTRAST TECHNIQUE: Multidetector CT imaging of the upper left extremity was performed according to the standard protocol. COMPARISON:  Radiographs from earlier on the same day. FINDINGS: Bones/Joint/Cartilage Acute, comminuted, fracture of the proximal humerus involving the surgical neck and greater tuberosity with impaction of the humeral neck on the humeral head. 51 degrees of dorsal angulation is noted of the humeral head relative to the humeral shaft. 5 mm of displacement of the greater tuberosity is noted relative to the head.  No glenoid fracture. AC joint osteoarthritis with undersurface spurring. The visualized adjacent ribs are intact. Ligaments Suboptimally assessed by CT. Muscles and Tendons Do soft tissue edema of the overlying deltoid. No abnormal fluid collections. Soft tissues Soft tissue induration consistent with soft tissue contusion from fracture. IMPRESSION: Apex anterior angulated surgical neck fracture by approximately 51 degrees with impaction on the humeral head. Slightly displaced greater tuberosity fracture by 5 mm. Electronically Signed   By: Ashley Royalty M.D.   On: 01/13/2017 20:26   Dg Shoulder Left  Result Date: 01/13/2017 CLINICAL DATA:  66 year old female status post trip and fall. Severe pain. EXAM: LEFT SHOULDER - 2+ VIEW COMPARISON:  None. FINDINGS: Comminuted, impacted, and mildly displaced proximal left humerus fracture. The greater tuberosity constitutes a butterfly fragment with mild displacement. No superimposed glenohumeral joint dislocation. The left clavicle and scapula appear to remain intact. Visible left ribs appear intact. IMPRESSION: Comminuted, impacted and mildly displaced proximal left humerus fracture. Electronically Signed   By: Genevie Ann M.D.   On: 01/13/2017 17:31    ____________________________________________    PROCEDURES  Procedure(s) performed:    Procedures    Medications  oxyCODONE-acetaminophen (PERCOCET/ROXICET) 5-325 MG per tablet 1 tablet (1 tablet Oral Given 01/13/17 1645)  ondansetron (ZOFRAN-ODT) disintegrating tablet 8 mg (8 mg Oral Given 01/13/17 1645)  fentaNYL (SUBLIMAZE) injection 100 mcg (100 mcg Intravenous Given 01/13/17 1908)  ondansetron (ZOFRAN) injection 4 mg (4 mg Intravenous Given 01/13/17 1903)  sodium chloride 0.9 % bolus 1,000 mL (0 mLs Intravenous Stopped 01/13/17 2115)  HYDROmorphone (DILAUDID) injection 1 mg (1 mg Intravenous Given 01/13/17 2040)     ____________________________________________   INITIAL IMPRESSION / ASSESSMENT AND  PLAN / ED COURSE  Pertinent labs & imaging results that were available during my care of the patient were reviewed by me and considered in my medical decision making (see chart for details).  Review of the [place] CSRS was  performed in accordance of the [place] prior to dispensing any controlled drugs.     Patient's diagnosis is consistent with closed displaced fracture of the proximal left humerus.  Patient presented to the emergency department with limited range of motion, exquisite pain.  Patient was initially evaluated with x-ray.  This revealed fracture of the proximal humerus.  I consulted with her orthopedic surgeon, Dr. Roland Rack who advises that the patient obtain a CT scan of the shoulder, placed in shoulder immobilizer, follow-up with him in the office for surgery.  Patient was neurovascularly on exam as well as after shoulder immobilizer was placed.  Patient will be discharged home with prescriptions for Percocet for pain.. Patient is to follow up with orthopedics for further management of fracture.  Follow-up with primary care as needed or otherwise directed. Patient is given ED precautions to return to the ED for any worsening or new symptoms.     ____________________________________________  FINAL CLINICAL IMPRESSION(S) / ED DIAGNOSES  Final diagnoses:  Other closed displaced fracture of proximal end of left humerus, initial encounter      NEW MEDICATIONS STARTED DURING THIS VISIT:  ED Discharge Orders        Ordered    oxyCODONE-acetaminophen (ROXICET) 5-325 MG tablet  Every 6 hours PRN     01/13/17 2039          This chart was dictated using voice recognition software/Dragon. Despite best efforts to proofread, errors can occur which can change the meaning. Any change was purely unintentional.    Darletta Moll, PA-C 01/13/17 2125    Merlyn Lot, MD 01/13/17 2214

## 2017-01-13 NOTE — ED Notes (Signed)
See triage note  States she tripped and hit left shoulder/elbow on steps  Increased pain with movement  Good pulses and sensation

## 2017-01-13 NOTE — ED Triage Notes (Signed)
Brought in via ems s/p fall states she tripped and fell on left arm   Presents with pain to left arm and shoulder

## 2017-01-19 ENCOUNTER — Encounter
Admission: RE | Admit: 2017-01-19 | Discharge: 2017-01-19 | Disposition: A | Discharge status: Home / Self Care | Payer: 59 | Source: Ambulatory Visit | Attending: Surgery | Admitting: Surgery

## 2017-01-19 LAB — SURGICAL PCR SCREEN
MRSA, PCR: NEGATIVE
Staphylococcus aureus: NEGATIVE

## 2017-01-19 LAB — URINALYSIS, ROUTINE W REFLEX MICROSCOPIC
Bilirubin Urine: NEGATIVE
Glucose, UA: NEGATIVE mg/dL
HGB URINE DIPSTICK: NEGATIVE
Ketones, ur: NEGATIVE mg/dL
Nitrite: NEGATIVE
PROTEIN: NEGATIVE mg/dL
Specific Gravity, Urine: 1.017 (ref 1.005–1.030)
pH: 5 (ref 5.0–8.0)

## 2017-01-19 LAB — BASIC METABOLIC PANEL
Anion gap: 11 (ref 5–15)
BUN: 14 mg/dL (ref 6–20)
CALCIUM: 9.2 mg/dL (ref 8.9–10.3)
CO2: 28 mmol/L (ref 22–32)
CREATININE: 0.79 mg/dL (ref 0.44–1.00)
Chloride: 96 mmol/L — ABNORMAL LOW (ref 101–111)
Glucose, Bld: 143 mg/dL — ABNORMAL HIGH (ref 65–99)
Potassium: 3.2 mmol/L — ABNORMAL LOW (ref 3.5–5.1)
SODIUM: 135 mmol/L (ref 135–145)

## 2017-01-19 LAB — CBC
HCT: 36.6 % (ref 35.0–47.0)
Hemoglobin: 12.2 g/dL (ref 12.0–16.0)
MCH: 28.3 pg (ref 26.0–34.0)
MCHC: 33.2 g/dL (ref 32.0–36.0)
MCV: 85 fL (ref 80.0–100.0)
PLATELETS: 379 10*3/uL (ref 150–440)
RBC: 4.3 MIL/uL (ref 3.80–5.20)
RDW: 13.6 % (ref 11.5–14.5)
WBC: 11.4 10*3/uL — ABNORMAL HIGH (ref 3.6–11.0)

## 2017-01-19 LAB — TYPE AND SCREEN
ABO/RH(D): B POS
Antibody Screen: NEGATIVE

## 2017-01-19 LAB — PROTIME-INR
INR: 0.99
PROTHROMBIN TIME: 13 s (ref 11.4–15.2)

## 2017-01-19 MED ORDER — CLINDAMYCIN PHOSPHATE 900 MG/50ML IV SOLN
900.0000 mg | Freq: Once | INTRAVENOUS | Status: AC
  Administered 2017-01-20: 900 mg via INTRAVENOUS

## 2017-01-19 NOTE — Patient Instructions (Signed)
Your procedure is scheduled on:01/20/17 0600 am Report to Day Surgery. [facility] SECOND FLOOR   Remember: Instructions that are not followed completely may result in serious medical risk, up to and including death, or upon the discretion of your surgeon and anesthesiologist your surgery may need to be rescheduled.     _X__ 1. Do not eat food after midnight the night before your procedure.                 No gum chewing or hard candies. You may drink clear liquids up to 2 hours                 before you are scheduled to arrive for your surgery- DO not drink clear                 liquids within 2 hours of the start of your surgery.                 Clear Liquids include:  water, apple juice without pulp, clear carbohydrate                 drink such as Clearfast of Gartorade, Black Coffee or Tea (Do not add                 anything to coffee or tea).     _X__ 2.  No Alcohol for 24 hours before or after surgery.   _X__ 3.  Do Not Smoke or use e-cigarettes For 24 Hours Prior to Your Surgery.                 Do not use any chewable tobacco products for at least 6 hours prior to                 surgery.  ____  4.  Bring all medications with you on the day of surgery if instructed.   __X__  5.  Notify your doctor if there is any change in your medical condition      (cold, fever, infections).     Do not wear jewelry, make-up, hairpins, clips or nail polish. Do not wear lotions, powders, or perfumes. You may wear deodorant. Do not shave 48 hours prior to surgery. Men may shave face and neck. Do not bring valuables to the hospital.    [facility] is not responsible for any belongings or valuables.  Contacts, dentures or bridgework may not be worn into surgery. Leave your suitcase in the car. After surgery it may be brought to your room. For patients admitted to the hospital, discharge time is determined by your treatment team.   Patients discharged the day of  surgery will not be allowed to drive home.   Please read over the following fact sheets that you were given:   Surgical Site Infection Prevention /MRSA  _X__ Take these medicines the morning of surgery with A SIP OF WATER:    1.PROPRANOLOL  2. BUSPAR  3. FINASTERIDE  4.GABAPENTIN  5.MONTELUKAST  6.SERTRALINE              7. PANTOPRAZOLE AT BEDTIME 01/19/17 AND AM 01/20/17  ____ Fleet Enema (as directed)   __X__ Use CHG Soap as directed  _X___ Use inhalers on the day of surgery  AND BRING  ____ Stop metformin 2 days prior to surgery    ____ Take 1/2 of usual insulin dose the night before surgery. No insulin the morning  of surgery.   ____ Stop Coumadin/Plavix/aspirin on  _X___ Stop Anti-inflammatories on     NO GLUCOSAMINE UNTIL AFTER SURGERY   ____ Stop supplements until after surgery.    _X___ Bring C-Pap to the hospital.

## 2017-01-20 ENCOUNTER — Inpatient Hospital Stay: Payer: 59

## 2017-01-20 ENCOUNTER — Inpatient Hospital Stay: Payer: 59 | Admitting: Anesthesiology

## 2017-01-20 ENCOUNTER — Other Ambulatory Visit: Payer: Self-pay

## 2017-01-20 ENCOUNTER — Inpatient Hospital Stay
Admission: RE | Admit: 2017-01-20 | Discharge: 2017-01-21 | DRG: 483 | Disposition: A | Discharge status: Home Health | Payer: 59 | Source: Ambulatory Visit | Attending: Surgery | Admitting: Surgery

## 2017-01-20 ENCOUNTER — Encounter
Admission: RE | Disposition: A | Discharge status: Home Health | Payer: Self-pay | Source: Ambulatory Visit | Attending: Surgery

## 2017-01-20 DIAGNOSIS — Z87891 Personal history of nicotine dependence: Secondary | ICD-10-CM | POA: Diagnosis not present

## 2017-01-20 DIAGNOSIS — W010XXA Fall on same level from slipping, tripping and stumbling without subsequent striking against object, initial encounter: Secondary | ICD-10-CM | POA: Diagnosis present

## 2017-01-20 DIAGNOSIS — F329 Major depressive disorder, single episode, unspecified: Secondary | ICD-10-CM | POA: Diagnosis present

## 2017-01-20 DIAGNOSIS — E7800 Pure hypercholesterolemia, unspecified: Secondary | ICD-10-CM | POA: Diagnosis present

## 2017-01-20 DIAGNOSIS — Z96612 Presence of left artificial shoulder joint: Secondary | ICD-10-CM

## 2017-01-20 DIAGNOSIS — G629 Polyneuropathy, unspecified: Secondary | ICD-10-CM | POA: Diagnosis present

## 2017-01-20 DIAGNOSIS — M810 Age-related osteoporosis without current pathological fracture: Secondary | ICD-10-CM | POA: Diagnosis present

## 2017-01-20 DIAGNOSIS — S42292A Other displaced fracture of upper end of left humerus, initial encounter for closed fracture: Principal | ICD-10-CM | POA: Diagnosis present

## 2017-01-20 DIAGNOSIS — G4730 Sleep apnea, unspecified: Secondary | ICD-10-CM | POA: Diagnosis present

## 2017-01-20 DIAGNOSIS — M25512 Pain in left shoulder: Secondary | ICD-10-CM | POA: Diagnosis present

## 2017-01-20 DIAGNOSIS — K219 Gastro-esophageal reflux disease without esophagitis: Secondary | ICD-10-CM | POA: Diagnosis present

## 2017-01-20 DIAGNOSIS — Z79899 Other long term (current) drug therapy: Secondary | ICD-10-CM

## 2017-01-20 DIAGNOSIS — Z86718 Personal history of other venous thrombosis and embolism: Secondary | ICD-10-CM

## 2017-01-20 DIAGNOSIS — Z85820 Personal history of malignant melanoma of skin: Secondary | ICD-10-CM | POA: Diagnosis not present

## 2017-01-20 HISTORY — PX: REVERSE SHOULDER ARTHROPLASTY: SHX5054

## 2017-01-20 LAB — ABO/RH: ABO/RH(D): B POS

## 2017-01-20 SURGERY — ARTHROPLASTY, SHOULDER, TOTAL, REVERSE
Anesthesia: General | Laterality: Left

## 2017-01-20 MED ORDER — PHENYLEPHRINE HCL 10 MG/ML IJ SOLN
INTRAMUSCULAR | Status: DC | PRN
  Administered 2017-01-20 (×3): 200 ug via INTRAVENOUS

## 2017-01-20 MED ORDER — MIDAZOLAM HCL 5 MG/5ML IJ SOLN
INTRAMUSCULAR | Status: DC | PRN
  Administered 2017-01-20: 2 mg via INTRAVENOUS

## 2017-01-20 MED ORDER — ACETAMINOPHEN 650 MG RE SUPP: 650.0000 mg | RECTAL | Status: DC | PRN

## 2017-01-20 MED ORDER — BUPIVACAINE-EPINEPHRINE (PF) 0.5% -1:200000 IJ SOLN
INTRAMUSCULAR | Status: AC
  Filled 2017-01-20: qty 30

## 2017-01-20 MED ORDER — SODIUM CHLORIDE 0.9 % IJ SOLN
INTRAMUSCULAR | Status: AC
  Filled 2017-01-20: qty 50

## 2017-01-20 MED ORDER — BUPIVACAINE LIPOSOME 1.3 % IJ SUSP
INTRAMUSCULAR | Status: AC
  Filled 2017-01-20: qty 20

## 2017-01-20 MED ORDER — DOCUSATE SODIUM 100 MG PO CAPS: 100.0000 mg | ORAL_CAPSULE | Freq: Two times a day (BID) | ORAL | Status: DC

## 2017-01-20 MED ORDER — PROPOFOL 500 MG/50ML IV EMUL
INTRAVENOUS | Status: DC | PRN
  Administered 2017-01-20: 120 ug/kg/min via INTRAVENOUS

## 2017-01-20 MED ORDER — GLYCOPYRROLATE 0.2 MG/ML IJ SOLN
INTRAMUSCULAR | Status: DC | PRN
  Administered 2017-01-20: .2 mg via INTRAVENOUS
  Administered 2017-01-20: 0.2 mg via INTRAVENOUS

## 2017-01-20 MED ORDER — KETOROLAC TROMETHAMINE 15 MG/ML IJ SOLN: 30.0000 mg | Freq: Once | INTRAMUSCULAR | Status: DC

## 2017-01-20 MED ORDER — CALCIUM CARBONATE ANTACID 500 MG PO CHEW
500.0000 mg | CHEWABLE_TABLET | Freq: Every day | ORAL | Status: DC
  Administered 2017-01-21: 500 mg via ORAL
  Filled 2017-01-20: qty 3

## 2017-01-20 MED ORDER — SUGAMMADEX SODIUM 200 MG/2ML IV SOLN
INTRAVENOUS | Status: AC
  Filled 2017-01-20: qty 2

## 2017-01-20 MED ORDER — OXYCODONE HCL 5 MG PO TABS: 10.0000 mg | ORAL_TABLET | ORAL | Status: DC | PRN

## 2017-01-20 MED ORDER — FENTANYL CITRATE (PF) 100 MCG/2ML IJ SOLN
INTRAMUSCULAR | Status: AC
  Administered 2017-01-20: 25 ug via INTRAVENOUS
  Filled 2017-01-20: qty 2

## 2017-01-20 MED ORDER — ACETAMINOPHEN 500 MG PO TABS
1000.0000 mg | ORAL_TABLET | Freq: Four times a day (QID) | ORAL | Status: AC
  Administered 2017-01-20 – 2017-01-21 (×3): 1000 mg via ORAL
  Filled 2017-01-20 (×3): qty 2

## 2017-01-20 MED ORDER — CLINDAMYCIN PHOSPHATE 900 MG/50ML IV SOLN
INTRAVENOUS | Status: AC
  Filled 2017-01-20: qty 50

## 2017-01-20 MED ORDER — BUPIVACAINE-EPINEPHRINE (PF) 0.5% -1:200000 IJ SOLN
INTRAMUSCULAR | Status: DC | PRN
  Administered 2017-01-20: 30 mL via PERINEURAL

## 2017-01-20 MED ORDER — KETAMINE HCL 10 MG/ML IJ SOLN
INTRAMUSCULAR | Status: DC | PRN
  Administered 2017-01-20: 50 mg via INTRAVENOUS

## 2017-01-20 MED ORDER — BUSPIRONE HCL 15 MG PO TABS
7.5000 mg | ORAL_TABLET | Freq: Two times a day (BID) | ORAL | Status: DC
  Administered 2017-01-20: 7.5 mg via ORAL
  Filled 2017-01-20 (×3): qty 1

## 2017-01-20 MED ORDER — OXYCODONE HCL 5 MG/5ML PO SOLN: 5.0000 mg | Freq: Once | ORAL | Status: DC | PRN

## 2017-01-20 MED ORDER — ACETAMINOPHEN 10 MG/ML IV SOLN
INTRAVENOUS | Status: AC
  Filled 2017-01-20: qty 100

## 2017-01-20 MED ORDER — PROPRANOLOL HCL 20 MG PO TABS
20.0000 mg | ORAL_TABLET | Freq: Two times a day (BID) | ORAL | Status: DC
  Administered 2017-01-20: 20 mg via ORAL
  Filled 2017-01-20: qty 1

## 2017-01-20 MED ORDER — LACTATED RINGERS IV SOLN
INTRAVENOUS | Status: DC
  Administered 2017-01-20: 1000 mL via INTRAVENOUS

## 2017-01-20 MED ORDER — ACETAMINOPHEN 10 MG/ML IV SOLN
INTRAVENOUS | Status: DC | PRN
  Administered 2017-01-20: 1000 mg via INTRAVENOUS

## 2017-01-20 MED ORDER — MAGNESIUM HYDROXIDE 400 MG/5ML PO SUSP: 30.0000 mL | Freq: Every day | ORAL | Status: DC | PRN

## 2017-01-20 MED ORDER — FENTANYL CITRATE (PF) 100 MCG/2ML IJ SOLN
INTRAMUSCULAR | Status: AC
  Filled 2017-01-20: qty 2

## 2017-01-20 MED ORDER — KETOROLAC TROMETHAMINE 30 MG/ML IJ SOLN
INTRAMUSCULAR | Status: DC | PRN
  Administered 2017-01-20: 30 mg via INTRAVENOUS

## 2017-01-20 MED ORDER — AZELASTINE-FLUTICASONE 137-50 MCG/ACT NA SUSP: 1.0000 | Freq: Two times a day (BID) | NASAL | Status: DC | PRN

## 2017-01-20 MED ORDER — CLINDAMYCIN PHOSPHATE 900 MG/50ML IV SOLN
900.0000 mg | Freq: Four times a day (QID) | INTRAVENOUS | Status: AC
  Administered 2017-01-20 – 2017-01-21 (×3): 900 mg via INTRAVENOUS
  Filled 2017-01-20 (×3): qty 50

## 2017-01-20 MED ORDER — FENTANYL CITRATE (PF) 100 MCG/2ML IJ SOLN
25.0000 ug | INTRAMUSCULAR | Status: DC | PRN
  Administered 2017-01-20 (×2): 25 ug via INTRAVENOUS
  Administered 2017-01-20: 100 ug via INTRAVENOUS
  Administered 2017-01-20: 50 ug via INTRAVENOUS

## 2017-01-20 MED ORDER — ONDANSETRON HCL 4 MG/2ML IJ SOLN
INTRAMUSCULAR | Status: DC | PRN
  Administered 2017-01-20: 4 mg via INTRAVENOUS

## 2017-01-20 MED ORDER — FENOFIBRATE 160 MG PO TABS
160.0000 mg | ORAL_TABLET | Freq: Every day | ORAL | Status: DC
  Filled 2017-01-20 (×2): qty 1

## 2017-01-20 MED ORDER — SODIUM CHLORIDE 0.9 % IV SOLN
INTRAVENOUS | Status: DC | PRN
  Administered 2017-01-20: 30 ug/min via INTRAVENOUS

## 2017-01-20 MED ORDER — METOCLOPRAMIDE HCL 10 MG PO TABS: 5.0000 mg | ORAL_TABLET | Freq: Three times a day (TID) | ORAL | Status: DC | PRN

## 2017-01-20 MED ORDER — NEOMYCIN-POLYMYXIN B GU 40-200000 IR SOLN
Status: DC | PRN
  Administered 2017-01-20: 16 mL

## 2017-01-20 MED ORDER — MONTELUKAST SODIUM 10 MG PO TABS
10.0000 mg | ORAL_TABLET | Freq: Every morning | ORAL | Status: DC
  Administered 2017-01-21: 10 mg via ORAL
  Filled 2017-01-20: qty 1

## 2017-01-20 MED ORDER — ENOXAPARIN SODIUM 40 MG/0.4ML ~~LOC~~ SOLN
40.0000 mg | SUBCUTANEOUS | Status: DC
  Administered 2017-01-21: 40 mg via SUBCUTANEOUS
  Filled 2017-01-20: qty 0.4

## 2017-01-20 MED ORDER — ROCURONIUM BROMIDE 100 MG/10ML IV SOLN
INTRAVENOUS | Status: DC | PRN
  Administered 2017-01-20: 50 mg via INTRAVENOUS

## 2017-01-20 MED ORDER — ALBUTEROL SULFATE (2.5 MG/3ML) 0.083% IN NEBU: 2.5000 mg | INHALATION_SOLUTION | Freq: Four times a day (QID) | RESPIRATORY_TRACT | Status: DC | PRN

## 2017-01-20 MED ORDER — MIDAZOLAM HCL 2 MG/2ML IJ SOLN
INTRAMUSCULAR | Status: AC
  Filled 2017-01-20: qty 2

## 2017-01-20 MED ORDER — TRANEXAMIC ACID 1000 MG/10ML IV SOLN
INTRAVENOUS | Status: DC | PRN
  Administered 2017-01-20: 1000 mg via TOPICAL

## 2017-01-20 MED ORDER — RALOXIFENE HCL 60 MG PO TABS
60.0000 mg | ORAL_TABLET | Freq: Every day | ORAL | Status: DC
  Filled 2017-01-20 (×2): qty 1

## 2017-01-20 MED ORDER — ONDANSETRON HCL 4 MG PO TABS: 4.0000 mg | ORAL_TABLET | Freq: Four times a day (QID) | ORAL | Status: DC | PRN

## 2017-01-20 MED ORDER — PROPOFOL 10 MG/ML IV BOLUS
INTRAVENOUS | Status: DC | PRN
  Administered 2017-01-20: 150 mg via INTRAVENOUS

## 2017-01-20 MED ORDER — SUGAMMADEX SODIUM 200 MG/2ML IV SOLN
INTRAVENOUS | Status: DC | PRN
  Administered 2017-01-20: 200 mg via INTRAVENOUS

## 2017-01-20 MED ORDER — GABAPENTIN 300 MG PO CAPS
600.0000 mg | ORAL_CAPSULE | Freq: Three times a day (TID) | ORAL | Status: DC
  Administered 2017-01-20 – 2017-01-21 (×3): 600 mg via ORAL
  Filled 2017-01-20 (×3): qty 2

## 2017-01-20 MED ORDER — FLUOCINOLONE ACETONIDE 0.01 % OT OIL
1.0000 | TOPICAL_OIL | OTIC | Status: DC | PRN
Start: 2017-01-20 — End: 2017-01-21

## 2017-01-20 MED ORDER — ALBUTEROL SULFATE HFA 108 (90 BASE) MCG/ACT IN AERS: 2.0000 | INHALATION_SPRAY | Freq: Four times a day (QID) | RESPIRATORY_TRACT | Status: DC | PRN

## 2017-01-20 MED ORDER — PROPOFOL 500 MG/50ML IV EMUL
INTRAVENOUS | Status: AC
  Filled 2017-01-20: qty 50

## 2017-01-20 MED ORDER — SERTRALINE HCL 50 MG PO TABS: 200.0000 mg | ORAL_TABLET | Freq: Every day | ORAL | Status: DC

## 2017-01-20 MED ORDER — TRANEXAMIC ACID 1000 MG/10ML IV SOLN
INTRAVENOUS | Status: AC
  Filled 2017-01-20: qty 10

## 2017-01-20 MED ORDER — PANTOPRAZOLE SODIUM 40 MG PO TBEC
40.0000 mg | DELAYED_RELEASE_TABLET | Freq: Two times a day (BID) | ORAL | Status: DC
  Administered 2017-01-20 – 2017-01-21 (×2): 40 mg via ORAL
  Filled 2017-01-20 (×2): qty 1

## 2017-01-20 MED ORDER — LIDOCAINE HCL (CARDIAC) 20 MG/ML IV SOLN
INTRAVENOUS | Status: DC | PRN
  Administered 2017-01-20: 100 mg via INTRAVENOUS

## 2017-01-20 MED ORDER — PROPOFOL 500 MG/50ML IV EMUL
INTRAVENOUS | Status: AC
  Filled 2017-01-20: qty 100

## 2017-01-20 MED ORDER — HYDROMORPHONE HCL 1 MG/ML IJ SOLN: 0.2500 mg | INTRAMUSCULAR | Status: DC | PRN

## 2017-01-20 MED ORDER — METOCLOPRAMIDE HCL 5 MG/ML IJ SOLN: 5.0000 mg | Freq: Three times a day (TID) | INTRAMUSCULAR | Status: DC | PRN

## 2017-01-20 MED ORDER — OXYCODONE HCL 5 MG PO TABS: 5.0000 mg | ORAL_TABLET | Freq: Once | ORAL | Status: DC | PRN

## 2017-01-20 MED ORDER — OXYCODONE HCL 5 MG PO TABS
5.0000 mg | ORAL_TABLET | ORAL | Status: DC | PRN
  Administered 2017-01-20: 5 mg via ORAL
  Filled 2017-01-20: qty 1

## 2017-01-20 MED ORDER — NEOMYCIN-POLYMYXIN B GU 40-200000 IR SOLN
Status: AC
  Filled 2017-01-20: qty 20

## 2017-01-20 MED ORDER — KCL IN DEXTROSE-NACL 20-5-0.9 MEQ/L-%-% IV SOLN
INTRAVENOUS | Status: DC
  Administered 2017-01-20 – 2017-01-21 (×2): via INTRAVENOUS
  Filled 2017-01-20 (×3): qty 1000

## 2017-01-20 MED ORDER — DOCUSATE SODIUM 100 MG PO CAPS
100.0000 mg | ORAL_CAPSULE | Freq: Two times a day (BID) | ORAL | Status: DC
  Administered 2017-01-20 – 2017-01-21 (×2): 100 mg via ORAL
  Filled 2017-01-20 (×2): qty 1

## 2017-01-20 MED ORDER — DIPHENHYDRAMINE HCL 12.5 MG/5ML PO ELIX: 12.5000 mg | ORAL_SOLUTION | ORAL | Status: DC | PRN

## 2017-01-20 MED ORDER — KETAMINE HCL 50 MG/ML IJ SOLN
INTRAMUSCULAR | Status: AC
  Filled 2017-01-20: qty 10

## 2017-01-20 MED ORDER — PROPOFOL 10 MG/ML IV BOLUS
INTRAVENOUS | Status: AC
  Filled 2017-01-20: qty 40

## 2017-01-20 MED ORDER — ROCURONIUM BROMIDE 50 MG/5ML IV SOLN
INTRAVENOUS | Status: AC
  Filled 2017-01-20: qty 1

## 2017-01-20 MED ORDER — BETAMETHASONE VALERATE 0.12 % EX FOAM: 1.0000 "application " | CUTANEOUS | Status: DC | PRN

## 2017-01-20 MED ORDER — DEXAMETHASONE SODIUM PHOSPHATE 10 MG/ML IJ SOLN
INTRAMUSCULAR | Status: AC
  Filled 2017-01-20: qty 1

## 2017-01-20 MED ORDER — DEXAMETHASONE SODIUM PHOSPHATE 10 MG/ML IJ SOLN
INTRAMUSCULAR | Status: DC | PRN
  Administered 2017-01-20: 10 mg via INTRAVENOUS

## 2017-01-20 MED ORDER — HYDROMORPHONE HCL 1 MG/ML IJ SOLN: 0.5000 mg | INTRAMUSCULAR | Status: DC | PRN

## 2017-01-20 MED ORDER — HYDROCORTISONE 2.5 % EX CREA: 1.0000 "application " | TOPICAL_CREAM | Freq: Two times a day (BID) | CUTANEOUS | Status: DC | PRN

## 2017-01-20 MED ORDER — PROPOFOL 10 MG/ML IV BOLUS
INTRAVENOUS | Status: AC
  Filled 2017-01-20: qty 20

## 2017-01-20 MED ORDER — ONDANSETRON HCL 4 MG/2ML IJ SOLN
INTRAMUSCULAR | Status: AC
  Filled 2017-01-20: qty 2

## 2017-01-20 MED ORDER — ACETAMINOPHEN 325 MG PO TABS
650.0000 mg | ORAL_TABLET | ORAL | Status: DC | PRN
  Administered 2017-01-21: 650 mg via ORAL
  Filled 2017-01-20: qty 2

## 2017-01-20 MED ORDER — KETOCONAZOLE 2 % EX CREA: 1.0000 "application " | TOPICAL_CREAM | Freq: Two times a day (BID) | CUTANEOUS | Status: DC | PRN

## 2017-01-20 MED ORDER — ONDANSETRON HCL 4 MG/2ML IJ SOLN: 4.0000 mg | Freq: Four times a day (QID) | INTRAMUSCULAR | Status: DC | PRN

## 2017-01-20 MED ORDER — KETOROLAC TROMETHAMINE 30 MG/ML IJ SOLN
INTRAMUSCULAR | Status: AC
  Filled 2017-01-20: qty 1

## 2017-01-20 MED ORDER — KETOROLAC TROMETHAMINE 15 MG/ML IJ SOLN
15.0000 mg | Freq: Four times a day (QID) | INTRAMUSCULAR | Status: AC
  Administered 2017-01-20 – 2017-01-21 (×3): 15 mg via INTRAVENOUS
  Filled 2017-01-20 (×3): qty 1

## 2017-01-20 MED ORDER — FLEET ENEMA 7-19 GM/118ML RE ENEM: 1.0000 | ENEMA | Freq: Once | RECTAL | Status: DC | PRN

## 2017-01-20 MED ORDER — SUCCINYLCHOLINE CHLORIDE 20 MG/ML IJ SOLN
INTRAMUSCULAR | Status: AC
  Filled 2017-01-20: qty 1

## 2017-01-20 MED ORDER — IPRATROPIUM-ALBUTEROL 0.5-2.5 (3) MG/3ML IN SOLN
RESPIRATORY_TRACT | Status: AC
  Administered 2017-01-20: 3 mL via RESPIRATORY_TRACT
  Filled 2017-01-20: qty 3

## 2017-01-20 MED ORDER — BUMETANIDE 1 MG PO TABS
1.0000 mg | ORAL_TABLET | Freq: Two times a day (BID) | ORAL | Status: DC
  Filled 2017-01-20 (×3): qty 1

## 2017-01-20 MED ORDER — VITAMIN D 1000 UNITS PO TABS
2000.0000 [IU] | ORAL_TABLET | Freq: Every day | ORAL | Status: DC
  Administered 2017-01-21: 2000 [IU] via ORAL
  Filled 2017-01-20: qty 2

## 2017-01-20 MED ORDER — LIDOCAINE HCL (PF) 2 % IJ SOLN
INTRAMUSCULAR | Status: AC
  Filled 2017-01-20: qty 10

## 2017-01-20 MED ORDER — SODIUM CHLORIDE 0.9 % IV SOLN
INTRAVENOUS | Status: DC | PRN
  Administered 2017-01-20: 60 mL

## 2017-01-20 MED ORDER — IPRATROPIUM-ALBUTEROL 0.5-2.5 (3) MG/3ML IN SOLN
3.0000 mL | RESPIRATORY_TRACT | Status: DC
  Administered 2017-01-20: 3 mL via RESPIRATORY_TRACT

## 2017-01-20 MED ORDER — BISACODYL 10 MG RE SUPP: 10.0000 mg | Freq: Every day | RECTAL | Status: DC | PRN

## 2017-01-20 SURGICAL SUPPLY — 68 items
BASEPLATE BOSS DRILL (MISCELLANEOUS) IMPLANT
BIT DRILL 15MM MENTRAL PEG (BIT) IMPLANT
BIT DRILL 2.5 (BIT)
BIT DRILL 2.5X4.5XSCR (BIT) IMPLANT
BIT DRILL 2.8 F/ 5.5 SCREW (BIT) IMPLANT
BIT DRL 2.5X4.5XSCR (BIT)
BLADE SAGITTAL WIDE XTHICK NO (BLADE) ×3 IMPLANT
CANISTER SUCT 1200ML W/VALVE (MISCELLANEOUS) ×3 IMPLANT
CANISTER SUCT 3000ML PPV (MISCELLANEOUS) ×6 IMPLANT
CAPT SHLDR REVTOTAL 2 (Capitated) ×3 IMPLANT
CHLORAPREP W/TINT 26ML (MISCELLANEOUS) ×3 IMPLANT
COOLER POLAR GLACIER W/PUMP (MISCELLANEOUS) ×3 IMPLANT
CRADLE LAMINECT ARM (MISCELLANEOUS) ×3 IMPLANT
DRAPE IMP U-DRAPE 54X76 (DRAPES) ×6 IMPLANT
DRAPE INCISE IOBAN 66X45 STRL (DRAPES) ×6 IMPLANT
DRAPE INCISE IOBAN 66X60 STRL (DRAPES) ×3 IMPLANT
DRAPE SHEET LG 3/4 BI-LAMINATE (DRAPES) ×6 IMPLANT
DRAPE TABLE BACK 80X90 (DRAPES) ×3 IMPLANT
DRILL 2.5MM (BIT)
DRILL 2.8MM (BIT)
DRSG OPSITE POSTOP 4X8 (GAUZE/BANDAGES/DRESSINGS) ×3 IMPLANT
ELECT CAUTERY BLADE 6.4 (BLADE) ×3 IMPLANT
GLOVE BIO SURGEON STRL SZ7.5 (GLOVE) ×12 IMPLANT
GLOVE BIO SURGEON STRL SZ8 (GLOVE) ×12 IMPLANT
GLOVE BIOGEL PI IND STRL 8 (GLOVE) ×1 IMPLANT
GLOVE BIOGEL PI INDICATOR 8 (GLOVE) ×2
GLOVE INDICATOR 8.0 STRL GRN (GLOVE) ×3 IMPLANT
GOWN STRL REUS W/ TWL LRG LVL3 (GOWN DISPOSABLE) ×1 IMPLANT
GOWN STRL REUS W/ TWL XL LVL3 (GOWN DISPOSABLE) ×1 IMPLANT
GOWN STRL REUS W/TWL LRG LVL3 (GOWN DISPOSABLE) ×2
GOWN STRL REUS W/TWL XL LVL3 (GOWN DISPOSABLE) ×2
GUIDE PIN 2.0 S150MM (PIN) IMPLANT
HOOD PEEL AWAY FLYTE STAYCOOL (MISCELLANEOUS) ×9 IMPLANT
KIT RM TURNOVER STRD PROC AR (KITS) ×3 IMPLANT
KIT STABILIZATION SHOULDER (MISCELLANEOUS) ×3 IMPLANT
MASK FACE SPIDER DISP (MASK) ×3 IMPLANT
NDL MAYO CATGUT SZ1 (NEEDLE) ×3
NDL MAYO CATGUT SZ5 (NEEDLE)
NDL SAFETY ECLIPSE 18X1.5 (NEEDLE) ×1 IMPLANT
NDL SUT 5 .5 CRC TPR PNT MAYO (NEEDLE) IMPLANT
NEEDLE HYPO 18GX1.5 SHARP (NEEDLE) ×2
NEEDLE HYPO 22GX1.5 SAFETY (NEEDLE) ×3 IMPLANT
NEEDLE HYPO 25X1 1.5 SAFETY (NEEDLE) ×3 IMPLANT
NEEDLE MAYO CATGUT SZ1 (NEEDLE) ×1 IMPLANT
NEEDLE MAYO CATGUT SZ4 (NEEDLE) IMPLANT
NEEDLE SPNL 20GX3.5 QUINCKE YW (NEEDLE) ×3 IMPLANT
NS IRRIG 500ML POUR BTL (IV SOLUTION) ×3 IMPLANT
PACK ARTHROSCOPY SHOULDER (MISCELLANEOUS) ×3 IMPLANT
PAD WRAPON POLAR SHDR UNIV (MISCELLANEOUS) ×1 IMPLANT
PULSAVAC PLUS IRRIG FAN TIP (DISPOSABLE) ×3
SLING ULTRA II M (MISCELLANEOUS) ×3 IMPLANT
SOL .9 NS 3000ML IRR  AL (IV SOLUTION) ×2
SOL .9 NS 3000ML IRR UROMATIC (IV SOLUTION) ×1 IMPLANT
SPONGE LAP 18X18 5 PK (GAUZE/BANDAGES/DRESSINGS) ×3 IMPLANT
STAPLER SKIN PROX 35W (STAPLE) ×3 IMPLANT
SUT ETHIBOND 0 MO6 C/R (SUTURE) ×3 IMPLANT
SUT FIBERWIRE #2 38 BLUE 1/2 (SUTURE) ×15
SUT VIC AB 0 CT1 36 (SUTURE) ×6 IMPLANT
SUT VIC AB 2-0 CT1 27 (SUTURE) ×4
SUT VIC AB 2-0 CT1 TAPERPNT 27 (SUTURE) ×2 IMPLANT
SUTURE FIBERWR #2 38 BLUE 1/2 (SUTURE) ×5 IMPLANT
SYR 10ML LL (SYRINGE) ×3 IMPLANT
SYR 30ML LL (SYRINGE) ×6 IMPLANT
TIP FAN IRRIG PULSAVAC PLUS (DISPOSABLE) ×1 IMPLANT
TRAY FOLEY W/METER SILVER 16FR (SET/KITS/TRAYS/PACK) ×3 IMPLANT
TUBING CONNECTING 10 (TUBING) ×2 IMPLANT
TUBING CONNECTING 10' (TUBING) ×1
WRAPON POLAR PAD SHDR UNIV (MISCELLANEOUS) ×3

## 2017-01-20 NOTE — Anesthesia Preprocedure Evaluation (Addendum)
Anesthesia Evaluation  Patient identified by MRN, date of birth, ID band Patient awake    Reviewed: Allergy & Precautions, H&P , NPO status , Patient's Chart, lab work & pertinent test results  History of Anesthesia Complications (+) POST - OP SPINAL HEADACHE and history of anesthetic complications  Airway Mallampati: III  TM Distance: <3 FB Neck ROM: limited    Dental  (+) Poor Dentition, Missing, Lower Dentures, Upper Dentures   Pulmonary shortness of breath and with exertion, sleep apnea , COPD (91% on room air, 96% on 3L [place] preOp), former smoker,           Cardiovascular Exercise Tolerance: Good (-) angina(-) Past MI and (-) DOE negative cardio ROS       Neuro/Psych PSYCHIATRIC DISORDERS Depression negative neurological ROS  negative psych ROS   GI/Hepatic Neg liver ROS, GERD  Medicated and Controlled,(+) Hepatitis -  Endo/Other  negative endocrine ROS  Renal/GU      Musculoskeletal  (+) Arthritis ,   Abdominal   Peds  Hematology negative hematology ROS (+)   Anesthesia Other Findings Past Medical History: No date: Adopted No date: Arthritis     Comment:  osteoarthritis 1975: Blood clot in vein No date: Cancer ([place]) No date: Complication of anesthesia     Comment:  body aches after anesthesia No date: GERD (gastroesophageal reflux disease) No date: Motion sickness     Comment:  curvy/hilly roads No date: Neuropathy     Comment:  upper legs No date: Osteoporosis No date: Skin cancer (melanoma) ([place])     Comment:  left upper thigh No date: Skin cancer of face     Comment:  basal cell No date: Sleep apnea     Comment:  CPAP No date: Tremor No date: Wears dentures     Comment:  full upper and lower  Past Surgical History: 1979: ABDOMINAL HYSTERECTOMY 1992: BLADDER SUSPENSION 2010: BREAST BIOPSY; Left     Comment:  benign 2010, 2015: COLONOSCOPY 1979: DILATION AND CURETTAGE OF UTERUS 1989:  HAMMER TOE SURGERY; Left 03/31/2016: HAMMER TOE SURGERY; Right     Comment:  Procedure: HAMMER TOE CORRECTION RIGHT T6 T7;  Surgeon:               Samara Deist, DPM;  Location: [facility];                Service: Podiatry;  Laterality: Right;  sleep apnea 03/05/2014: KNEE ARTHROSCOPY W/ PARTIAL MEDIAL MENISCECTOMY; Right     Comment:  medial and lateral.  Abrasion hondroplasty of medial               femoral condyle and femorla trochlea. No date: NASAL SEPTUM SURGERY 1973: TONSILLECTOMY No date: TRIGGER FINGER RELEASE; Left     Comment:  thumb No date: VEIN LIGATION 03/31/2016: WEIL OSTEOTOMY; Right     Comment:  Procedure: WEIL OSTEOTOMY X2 RIGHT FOOT;  Surgeon:               Samara Deist, DPM;  Location: [facility];                Service: Podiatry;  Laterality: Right;  IV WITH POPLITEAL     Reproductive/Obstetrics negative OB ROS                             Anesthesia Physical  Anesthesia Plan  ASA: III  Anesthesia Plan: General and General ETT  Post-op Pain Management:    Induction: Intravenous  PONV Risk Score and Plan: 3 and Ondansetron, Dexamethasone, Propofol, Midazolam, TIVA and Propofol infusion  Airway Management Planned: Oral ETT  Additional Equipment:   Intra-op Plan:   Post-operative Plan: Extubation in OR and Possible Post-op intubation/ventilation  Informed Consent: I have reviewed the patients History and Physical, chart, labs and discussed the procedure including the risks, benefits and alternatives for the proposed anesthesia with the patient or authorized representative who has indicated his/her understanding and acceptance.   Dental Advisory Given  Plan Discussed with: Anesthesiologist, CRNA and Surgeon  Anesthesia Plan Comments: (Patient reports myalgias after every anesthetic.  Plan for non triggering MH anesthetic.  Patient consented for risks of anesthesia including but not limited to:  - adverse  reactions to medications - damage to teeth, lips or other oral mucosa - sore throat or hoarseness - Damage to heart, brain, lungs or loss of life  Patient voiced understanding.)      Anesthesia Quick Evaluation

## 2017-01-20 NOTE — Op Note (Signed)
01/20/2017  10:39 AM  Patient:   Annette Quinn  Pre-Op Diagnosis:   Closed displaced left proximal humerus fracture.  Post-Op Diagnosis:   Same  Procedure:   Reverse left total shoulder arthroplasty.  Surgeon:   Pascal Lux, MD  Assistant:   Cameron Proud, PA-C  Anesthesia:   GET  Findings:   As above.  Complications:   None  EBL:  200 cc  Fluids:   600 cc crystalloid  UOP:   None  TT:   None  Drains:   None  Closure:   Staples  Implants:   All press-fit Integra system with a #10 humeral stem, a short metaphyseal component with a +6 mm insert, and a 15 mm base plate with a +2 mm laterally offset 38 mm concentric glenosphere.  Brief Clinical Note:   The patient is a 66 year old female who sustained the above-noted injury as a result of a fall last week.  X-rays and subsequent CT scanning in the emergency room demonstrated a displaced 3 part fracture of the left proximal humerus. The patient presents at this time for a reverse left total shoulder arthroplasty.  Procedure:   The patient was brought into the operating room and lain in the supine position. The patient then underwent general endotracheal intubation and anesthesia before she was repositioned in the beach chair position using the beach chair positioner. The left shoulder and upper extremity were prepped with ChloraPrep solution before being draped sterilely. Preoperative antibiotics were administered. A standard anterior approach to the shoulder was made through an approximately 4-5 inch incision. The incision was carried down through the subcutaneous tissues to expose the deltopectoral fascia. The interval between the deltoid and pectoralis muscles was identified and this plane developed, retracting the cephalic vein laterally with the deltoid muscle. The conjoined tendon was identified. Its lateral margin was dissected and the Kolbel self-retraining retractor inserted. The "three sisters" were identified and  cauterized. The fracture was readily identified. Bursal and fracture hematoma were removed to improve visualization. The biceps tendon was identified and a soft tissue biceps tenodesis performed into the pectoralis major tendon adjacent to the biceps tendon at the base of the groove. The bicipital groove was followed proximally and the rotator interval released to improve access into the joint.  Using an osteotome, the lesser tuberosity was osteotomized and the subscapularis tendon retracted medially after placing several tacking sutures. Additional tacking sutures were placed into the superior and posterior superior rotator cuff just medial to the remaining greater tuberosity fragments. The humeral head fragments were removed to expose the glenoid.   Attention was directed to the glenoid. The labrum was debrided circumferentially before the center of the glenoid was marked with electrocautery. The guidewire was drilled into the glenoid neck using the appropriate guide. After verifying its position, it was overreamed with the mini-baseplate reamer to create a flat surface. The's central stem reamer was introduced and used to core out the stem before the superior and inferior screw holes were placed using the appropriate guide. The permanent 15 mm baseplate was impacted into place. It was stabilized with a 20 x 6.5 mm central screw and four peripheral screws. Locking caps were placed over the superior and inferior screws. The permanent +2 mm laterally offset 38 mm concentric glenosphere was then impacted into place and its Morse taper locking mechanism verified using manual distraction.  Attention was redirected to the humeral side. The humeral canal was prepared beginning with the end-cutting reamer then progressing from  a 4 mm reamer up to a 10 mm reamer. This provided excellent circumferential chatter. The 10 mm trial stem with the small body was impacted into place. A trial reduction performed using the  standard trial humeral platform. The arm demonstrated excellent range of motion as the hand could be brought across the chest to the opposite shoulder and brought to the top of the patient's head and to the patient's ear. The shoulder appeared stable throughout this range of motion, although the joint could be distracted to several millimeters. The joint was dislocated and the trial components removed. The permanent #10 stem with the small metaphyseal body was impacted into place with care taken to maintain the appropriate version (approximately 20 degrees of retroversion). The set screw to connect the body to the stem was inserted and tightened securely before several trial reductions were performed using the +0, +3, and +6 mm liners. The +6 mm liner demonstrated excellent stability without undue tightness or laxity. The permanent +6 mm polyethylene liner was impacted into place and its locking mechanism verified. The shoulder was relocated using two finger pressure and again placed through a range of motion with the findings as described above.  The wound was copiously irrigated with bacitracin saline solution using the jet lavage system before a total of 20 cc of Exparel diluted out to 60 cc with normal saline and 30 cc of 0.5% Sensorcaine with epinephrine was injected into the pericapsular and peri-incisional tissues to help with postoperative analgesia. The greater and lesser tuberosities were reapproximated using multiple #2 FiberWire interrupted sutures, several of which were passed through the appropriate holes in the stem.  In addition, two #2 FiberWire sutures had been passed through the diaphyseal portion of the humerus just lateral to the bicipital groove prior to placement of the permanent stem. These also were passed proximally through the supraspinatus and subscapularis tendons to reinforce the reduction and stability of the greater and lesser tuberosities.   The wound again was copiously  irrigated with bacitracin saline solution using the jet lavage system. The deltopectoral interval was closed using #0 Vicryl interrupted sutures before the subcutaneous tissues were closed using 2-0 Vicryl interrupted sutures. The skin was closed using staples. Prior to closing the skin, 1 g of transexemic acid in 10 cc of normal saline was injected intra-articularly to help with postoperative bleeding. A sterile occlusive dressing was applied to the wound before the arm was placed into a shoulder immobilizer with an abduction pillow. A Polar Care system also was applied to the shoulder. The patient was then transferred back to a hospital bed before being awakened, extubated, and returned to the recovery room in satisfactory condition after tolerating the procedure well.

## 2017-01-20 NOTE — Anesthesia Post-op Follow-up Note (Signed)
Anesthesia QCDR form completed.        

## 2017-01-20 NOTE — Evaluation (Signed)
Physical Therapy Evaluation Patient Details Name: Annette Quinn MRN: [id number] DOB: 1950/02/22 Today's Date: 01/20/2017   History of Present Illness  Pt is a 66 y/o with L proximal humerus fracture now s/p L rTSA. Pt's PMH includes tremor, skin cancer, osteoporosis, hepatitis, blood clot.    Clinical Impression  Pt admitted with above diagnosis. Pt currently with functional limitations due to the deficits listed below (see PT Problem List). Annette Quinn was very pleasant and motivated to work with PT.  She currently requires min assist to remain steady during transfers and close min guard assist when ambulating.  When ambulating she uses a SPC for stability and requires cues to avoid bumping LUE into doorframe/wall/countertop. SpO2 down to 87% on RA with bed mobility.  [place] reapplied at 3L O2 with SpO2 remaining at or above 93% for remainder of session.  Pt encouraged to perform pursed lip breathing which the pt was familiar with.  Pt will have 24/7 assist/supervision available at d/c from family.  Pt will benefit from skilled PT to increase their independence and safety with mobility to allow discharge to the venue listed below.      Follow Up Recommendations Home health PT;Supervision/Assistance - 24 hour    Equipment Recommendations  None recommended by PT    Recommendations for Other Services OT consult     Precautions / Restrictions Precautions Precautions: Fall;Other (comment);Shoulder Type of Shoulder Precautions: not specified in order Precaution Comments: O2 Required Braces or Orthoses: Other Brace/Splint Other Brace/Splint: Abduction sling Restrictions Weight Bearing Restrictions: Yes LUE Weight Bearing: Non weight bearing      Mobility  Bed Mobility Overal bed mobility: Needs Assistance Bed Mobility: Supine to Sit     Supine to sit: Min assist;HOB elevated     General bed mobility comments: Min assist to elevate trunk.  Increased effort and time. Cues for  sequencing.   Transfers Overall transfer level: Needs assistance Equipment used: Straight cane Transfers: Sit to/from [facility] Sit to Stand: Min assist Stand pivot transfers: Min assist       General transfer comment: Cues for safe technique and proper hand placement.  Pt uses momentum to stand with increased effort and slow to rise.  Min assist provided to remain steady during transfer.    Ambulation/Gait Ambulation/Gait assistance: Min guard;+2 safety/equipment Ambulation Distance (Feet): 160 Feet Assistive device: Straight cane Gait Pattern/deviations: Step-through pattern;Decreased stride length;Wide base of support;Drifts right/left Gait velocity: decreased Gait velocity interpretation: Below normal speed for age/gender General Gait Details: Pt drifts L which is her baseline and required moderate verbal cues to correct and to compensate to avoid hitting LUE on wall/countertop/doorframe.  Pt ambulates with wide BOS and with increased weight shift to L and R.    Stairs            Wheelchair Mobility    Modified Rankin (Stroke Patients Only)       Balance Overall balance assessment: Needs assistance Sitting-balance support: No upper extremity supported;Feet supported Sitting balance-Leahy Scale: Good     Standing balance support: No upper extremity supported;During functional activity Standing balance-Leahy Scale: Fair Standing balance comment: Pt able to stand statically without UE support but relies on UE support for dynamic activities.                              Pertinent Vitals/Pain Pain Assessment: 0-10 Pain Score: 1  Pain Location: L shoulder Pain Descriptors / Indicators: Discomfort Pain  Intervention(s): Limited activity within patient's tolerance;Monitored during session;Repositioned;Ice applied    Home Living Family/patient expects to be discharged to:: Private residence Living Arrangements: Spouse/significant  other;Children Available Help at Discharge: Family;Available 24 hours/day Type of Home: House Home Access: Stairs to enter Entrance Stairs-Rails: Left;Right;Can reach both Entrance Stairs-Number of Steps: 4 Home Layout: One level Home Equipment: Walker - 4 wheels;Walker - standard;Cane - single point;Shower seat - built in;Grab bars - tub/shower Additional Comments: Pt has a child with a disability but this child has a personal care attendant    Prior Function Level of Independence: Independent with assistive device(s)         Comments: Pt will ambulate with rollator very rarely when she feels that she needs more support, otherwise not using AD.  Reports this is her only fall in the past 6 months.  Independent with bathing, dressing, cooking, cleaning, driving.       Hand Dominance   Dominant Hand: Right    Extremity/Trunk Assessment   Upper Extremity Assessment Upper Extremity Assessment: LUE deficits/detail LUE Deficits / Details: LUE in abduction sling, pt denies any numbness or tingling    Lower Extremity Assessment Lower Extremity Assessment: (BLE strength grossly 4/5)       Communication   Communication: No difficulties  Cognition Arousal/Alertness: Awake/alert Behavior During Therapy: WFL for tasks assessed/performed Overall Cognitive Status: Within Functional Limits for tasks assessed                                        General Comments General comments (skin integrity, edema, etc.): SpO2 down to 87% on RA with bed mobility.  [place] reapplied at 3L O2 with SpO2 remaining at or above 93% for remainder of session.  Pt encouraged to perform pursed lip breathing which the pt was familiar with.     Exercises     Assessment/Plan    PT Assessment Patient needs continued PT services  PT Problem List Decreased strength;Decreased range of motion;Decreased balance;Decreased knowledge of use of DME;Decreased safety awareness;Decreased knowledge of  precautions;Cardiopulmonary status limiting activity;Pain;Obesity       PT Treatment Interventions DME instruction;Gait training;Stair training;Functional mobility training;Therapeutic activities;Therapeutic exercise;Balance training;Neuromuscular re-education;Patient/family education;Modalities    PT Goals (Current goals can be found in the Care Plan section)  Acute Rehab PT Goals Patient Stated Goal: to go home PT Goal Formulation: With patient Time For Goal Achievement: 02/03/17 Potential to Achieve Goals: Good    Frequency BID   Barriers to discharge        Co-evaluation               AM-PAC PT "6 Clicks" Daily Activity  Outcome Measure Difficulty turning over in bed (including adjusting bedclothes, sheets and blankets)?: Unable Difficulty moving from lying on back to sitting on the side of the bed? : Unable Difficulty sitting down on and standing up from a chair with arms (e.g., wheelchair, bedside commode, etc,.)?: Unable Help needed moving to and from a bed to chair (including a wheelchair)?: A Little Help needed walking in hospital room?: A Little Help needed climbing 3-5 steps with a railing? : A Lot 6 Click Score: 11    End of Session Equipment Utilized During Treatment: Gait belt;Oxygen;Other (comment)(LUE abduction sling) Activity Tolerance: Patient tolerated treatment well Patient left: in chair;with call bell/phone within reach;with chair alarm set;with family/visitor present Nurse Communication: Mobility status;Other (comment)(SpO2) PT Visit Diagnosis: Pain;Unsteadiness on  feet (R26.81);Other abnormalities of gait and mobility (R26.89);Muscle weakness (generalized) (M62.81) Pain - Right/Left: Left Pain - part of body: Shoulder    Time: 4497-5300 PT Time Calculation (min) (ACUTE ONLY): 50 min   Charges:   PT Evaluation $PT Eval Low Complexity: 1 Low PT Treatments $Gait Training: 8-22 mins $Therapeutic Activity: 8-22 mins   PT G Codes:   PT  G-Codes **NOT FOR INPATIENT CLASS** Functional Assessment Tool Used: AM-PAC 6 Clicks Basic Mobility;Clinical judgement Functional Limitation: Mobility: Walking and moving around Mobility: Walking and Moving Around Current Status (F1102): At least 60 percent but less than 80 percent impaired, limited or restricted Mobility: Walking and Moving Around Goal Status [id number]): At least 20 percent but less than 40 percent impaired, limited or restricted    Collie Siad PT, DPT 01/20/2017, 4:41 PM

## 2017-01-20 NOTE — Transfer of Care (Signed)
Immediate Anesthesia Transfer of Care Note  Patient: Annette Quinn  Procedure(s) Performed: REVERSE SHOULDER ARTHROPLASTY (Left )  Patient Location: PACU  Anesthesia Type:General  Level of Consciousness: awake, drowsy and patient cooperative  Airway & Oxygen Therapy: Patient Spontanous Breathing and Patient connected to face mask oxygen  Post-op Assessment: Report given to RN and Post -op Vital signs reviewed and stable  Post vital signs: Reviewed and stable  Last Vitals:  Vitals:   01/20/17 0645 01/20/17 1050  BP:  (!) (P) 131/57  Pulse: 62 (P) 76  Resp: 17 (P) 20  Temp:  (P) 37.5 C  SpO2: 96% (P) 97%    Last Pain:  Vitals:   01/20/17 1050  TempSrc:   PainSc: (P) Asleep         Complications: No apparent anesthesia complications

## 2017-01-20 NOTE — H&P (Signed)
Paper H&P to be scanned into permanent record. H&P reviewed and patient re-examined. No changes. 

## 2017-01-20 NOTE — Anesthesia Postprocedure Evaluation (Signed)
Anesthesia Post Note  Patient: Annette Quinn  Procedure(s) Performed: REVERSE SHOULDER ARTHROPLASTY (Left )  Patient location during evaluation: PACU Anesthesia Type: General Level of consciousness: awake and alert Pain management: pain level controlled Vital Signs Assessment: post-procedure vital signs reviewed and stable Respiratory status: spontaneous breathing, nonlabored ventilation, respiratory function stable and patient connected to nasal cannula oxygen Cardiovascular status: blood pressure returned to baseline and stable Postop Assessment: no apparent nausea or vomiting Anesthetic complications: no     Last Vitals:  Vitals:   01/20/17 1137 01/20/17 1139  BP: 140/69   Pulse: 69 70  Resp: 15 17  Temp:    SpO2: 95% 93%    Last Pain:  Vitals:   01/20/17 1139  TempSrc:   PainSc: 2                  Precious Haws [place]

## 2017-01-20 NOTE — Anesthesia Procedure Notes (Signed)
Procedure Name: Intubation Date/Time: 01/20/2017 7:47 AM Performed by: Eben Burow, CRNA Pre-anesthesia Checklist: Patient identified, Emergency Drugs available, Suction available, Patient being monitored and Timeout performed Patient Re-evaluated:Patient Re-evaluated prior to induction Oxygen Delivery Method: Circle system utilized Preoxygenation: Pre-oxygenation with 100% oxygen Induction Type: IV induction Ventilation: Mask ventilation without difficulty Laryngoscope Size: Miller and 2 Grade View: Grade I Tube type: Oral Tube size: 7.5 mm Number of attempts: 1 Airway Equipment and Method: Stylet and LTA kit utilized Placement Confirmation: ETT inserted through vocal cords under direct vision,  positive ETCO2 and breath sounds checked- equal and bilateral Secured at: 20 cm Tube secured with: Tape Dental Injury: Teeth and Oropharynx as per pre-operative assessment

## 2017-01-20 NOTE — Care Management (Signed)
RNCM consult for discharge planning. Patient working with PT this evening. From home with husband. Will follow up in the am for disposition.

## 2017-01-21 ENCOUNTER — Encounter: Payer: Self-pay | Admitting: Surgery

## 2017-01-21 LAB — CBC WITH DIFFERENTIAL/PLATELET
BASOS ABS: 0.1 10*3/uL (ref 0–0.1)
BASOS PCT: 1 %
EOS ABS: 0.1 10*3/uL (ref 0–0.7)
Eosinophils Relative: 1 %
HCT: 30.9 % — ABNORMAL LOW (ref 35.0–47.0)
HEMOGLOBIN: 10.5 g/dL — AB (ref 12.0–16.0)
Lymphocytes Relative: 15 %
Lymphs Abs: 1.4 10*3/uL (ref 1.0–3.6)
MCH: 28.5 pg (ref 26.0–34.0)
MCHC: 34 g/dL (ref 32.0–36.0)
MCV: 83.9 fL (ref 80.0–100.0)
MONOS PCT: 12 %
Monocytes Absolute: 1.1 10*3/uL — ABNORMAL HIGH (ref 0.2–0.9)
NEUTROS ABS: 6.8 10*3/uL — AB (ref 1.4–6.5)
NEUTROS PCT: 71 %
Platelets: 299 10*3/uL (ref 150–440)
RBC: 3.68 MIL/uL — AB (ref 3.80–5.20)
RDW: 13.7 % (ref 11.5–14.5)
WBC: 9.5 10*3/uL (ref 3.6–11.0)

## 2017-01-21 LAB — BASIC METABOLIC PANEL
ANION GAP: 8 (ref 5–15)
BUN: 21 mg/dL — ABNORMAL HIGH (ref 6–20)
CHLORIDE: 99 mmol/L — AB (ref 101–111)
CO2: 28 mmol/L (ref 22–32)
CREATININE: 0.86 mg/dL (ref 0.44–1.00)
Calcium: 9 mg/dL (ref 8.9–10.3)
GFR calc non Af Amer: >60 mL/min (ref >60)
Glucose, Bld: 144 mg/dL — ABNORMAL HIGH (ref 65–99)
Potassium: 3.5 mmol/L (ref 3.5–5.1)
SODIUM: 135 mmol/L (ref 135–145)

## 2017-01-21 LAB — URINE CULTURE

## 2017-01-21 MED ORDER — OXYCODONE HCL 5 MG PO TABS: 5.0000 mg | ORAL_TABLET | ORAL | 0 refills | Status: DC | PRN

## 2017-01-21 MED ORDER — ENOXAPARIN SODIUM 40 MG/0.4ML ~~LOC~~ SOLN: 40.0000 mg | SUBCUTANEOUS | 0 refills | Status: DC

## 2017-01-21 MED ORDER — TRAMADOL HCL 50 MG PO TABS: 50.0000 mg | ORAL_TABLET | Freq: Four times a day (QID) | ORAL | 0 refills | Status: DC | PRN

## 2017-01-21 NOTE — Care Management Note (Signed)
Case Management Note  Patient Details  Name: Annette Quinn MRN: [id number] Date of Birth: [birth year]  Subjective/Objective:   Met with patient and spouse at bedside to discuss discharge plan. Patient lives at home with her spouse. Her daughter will be coming in to care for her s/p discharge. Offered choice of home health agencies. Referral to [facility] for HHPT/HHOT. Hx: blood clots. Will discharge on Lovenox. Pharmacy: [facility] [place]  [phone]. Called Lovenox 40 mg # 14 no refills.                 Action/Plan: [facility] for HHPT/HHOT, Lovenox called in.   Expected Discharge Date:  01/21/17               Expected Discharge Plan:  [place]  In-House Referral:     Discharge planning Services  CM Consult  Post Acute Care Choice:  Home Health Choice offered to:  Patient  DME Arranged:    DME Agency:     HH Arranged:  PT, OT [place] Agency:  [facility] (formerly [employer])  Status of Service:  Completed, signed off  If discussed at [employer], dates discussed:    Additional Comments:  Jolly Mango, RN 01/21/2017, 8:54 AM

## 2017-01-21 NOTE — Progress Notes (Signed)
Patient is being discharged home with family. Patient gave Lovenox injection without difficultly. IV remvoed with cath intact. Reviewed med, scripts, and last dose given. Changed dressing and sent extra dressing home. Allowed time for questions. Husband was at bedside during discharge.

## 2017-01-21 NOTE — Progress Notes (Signed)
Physical Therapy Treatment Patient Details Name: Annette Quinn MRN: [id number] DOB: 12-07-1950 Today's Date: 01/21/2017    History of Present Illness Pt is a 66 y/o with L proximal humerus fracture now s/p L rTSA. Pt's PMH includes tremor, skin cancer, osteoporosis, hepatitis, blood clot.    PT Comments    Annette Quinn made good progress with mobility this session.  Close min guard provided with transfers, stair training, and ambulation as pt with mild instability but no LOB.   SpO2 down to 87% after ambulating ~100 ft requiring 1L via [place] with SpO2 improving and remaining at or above 93% on 1L for remainder of session.  Pt will benefit from continued skilled PT services to increase functional independence and safety.   Follow Up Recommendations  Home health PT;Supervision/Assistance - 24 hour     Equipment Recommendations  None recommended by PT    Recommendations for Other Services       Precautions / Restrictions Precautions Precautions: Fall;Other (comment);Shoulder Type of Shoulder Precautions: not specified in order Precaution Comments: O2 Required Braces or Orthoses: Other Brace/Splint Other Brace/Splint: Abduction sling Restrictions Weight Bearing Restrictions: Yes LUE Weight Bearing: Non weight bearing    Mobility  Bed Mobility Overal bed mobility: Needs Assistance Bed Mobility: Supine to Sit     Supine to sit: HOB elevated;Supervision     General bed mobility comments: No use of bed rail to simulate home environment.  Pt with increased time and effort but no physical assist required.   Transfers Overall transfer level: Needs assistance Equipment used: Straight cane Transfers: Sit to/from Stand Sit to Stand: Min guard         General transfer comment: Close min guard assist.  Pt is slow to stand with mild instability but no LOB.  Pt with well controlled descent to sit.   Ambulation/Gait Ambulation/Gait assistance: Min guard;+2  safety/equipment Ambulation Distance (Feet): 200 Feet Assistive device: Straight cane Gait Pattern/deviations: Step-through pattern;Decreased stride length;Wide base of support;Drifts right/left Gait velocity: decreased Gait velocity interpretation: Below normal speed for age/gender General Gait Details: Pt drifts L which is her baseline and required mild verbal cues to avoid bumping into objects on her L.  SpO2 down to 87% after ambulating ~100 ft requiring 1L via [place] with SpO2 improving and remaining at or above 93% on 1L for remainder of session.     Stairs Stairs: Yes   Stair Management: One rail Left;One rail Right;Step to pattern;Forwards Number of Stairs: 4 General stair comments: Cues for safe technique.  Pt with mild instability but no LOB so close min guard provided.  Pt uses R railing during ascent and L railing during descent.   Wheelchair Mobility    Modified Rankin (Stroke Patients Only)       Balance Overall balance assessment: Needs assistance Sitting-balance support: No upper extremity supported;Feet supported Sitting balance-Leahy Scale: Good     Standing balance support: No upper extremity supported;During functional activity Standing balance-Leahy Scale: Fair Standing balance comment: Pt able to stand statically without UE support but relies on UE support for dynamic activities.                             Cognition Arousal/Alertness: Awake/alert Behavior During Therapy: WFL for tasks assessed/performed Overall Cognitive Status: Within Functional Limits for tasks assessed  Exercises General Exercises - Lower Extremity Ankle Circles/Pumps: AROM;Both;10 reps;Supine Other Exercises Other Exercises: Encouraged pt to ambulate at least every 45 minutes upon return home.     General Comments        Pertinent Vitals/Pain Pain Assessment: Faces Faces Pain Scale: Hurts little more Pain  Location: L shoulder Pain Descriptors / Indicators: Discomfort Pain Intervention(s): Limited activity within patient's tolerance;Monitored during session    Home Living                      Prior Function            PT Goals (current goals can now be found in the care plan section) Acute Rehab PT Goals Patient Stated Goal: to go home PT Goal Formulation: With patient Time For Goal Achievement: 02/03/17 Potential to Achieve Goals: Good Progress towards PT goals: Progressing toward goals    Frequency    BID      PT Plan Current plan remains appropriate    Co-evaluation              AM-PAC PT "6 Clicks" Daily Activity  Outcome Measure  Difficulty turning over in bed (including adjusting bedclothes, sheets and blankets)?: A Lot Difficulty moving from lying on back to sitting on the side of the bed? : A Lot Difficulty sitting down on and standing up from a chair with arms (e.g., wheelchair, bedside commode, etc,.)?: A Lot Help needed moving to and from a bed to chair (including a wheelchair)?: A Little Help needed walking in hospital room?: A Little Help needed climbing 3-5 steps with a railing? : A Little 6 Click Score: 15    End of Session Equipment Utilized During Treatment: Gait belt;Oxygen;Other (comment)(LUE abduction sling) Activity Tolerance: Patient tolerated treatment well Patient left: in chair;with call bell/phone within reach;with chair alarm set;with family/visitor present Nurse Communication: Mobility status;Other (comment)(SpO2) PT Visit Diagnosis: Pain;Unsteadiness on feet (R26.81);Other abnormalities of gait and mobility (R26.89);Muscle weakness (generalized) (M62.81) Pain - Right/Left: Left Pain - part of body: Shoulder     Time: 1004-1030 PT Time Calculation (min) (ACUTE ONLY): 26 min  Charges:  $Gait Training: 23-37 mins                    G Codes:       Collie Siad PT, DPT 01/21/2017, 2:06 PM

## 2017-01-21 NOTE — Discharge Instructions (Signed)
Diet: As you were doing prior to hospitalization   Shower:  May shower but keep the wounds dry, use an occlusive plastic wrap, NO SOAKING IN TUB.  If the bandage gets wet, change with a clean dry gauze.  Dressing:  You may change your dressing as needed. Change the dressing with sterile gauze dressing.    Activity:  Increase activity slowly as tolerated, but follow the weight bearing instructions below.  No lifting or driving for 6 weeks.  Weight Bearing:   Non-weightbearing to the left arm.  To prevent constipation: you may use a stool softener such as -  Colace (over the counter) 100 mg by mouth twice a day  Drink plenty of fluids (prune juice may be helpful) and high fiber foods Miralax (over the counter) for constipation as needed.    Itching:  If you experience itching with your medications, try taking only a single pain pill, or even half a pain pill at a time.  You may take up to 10 pain pills per day, and you can also use benadryl over the counter for itching or also to help with sleep.   Precautions:  If you experience chest pain or shortness of breath - call 911 immediately for transfer to the hospital emergency department!!  If you develop a fever greater that 101 F, purulent drainage from wound, increased redness or drainage from wound, or calf pain-Call [facility]                                              Follow- Up Appointment:  Please call for an appointment to be seen in 2 weeks at [facility]

## 2017-01-21 NOTE — Progress Notes (Signed)
  Subjective: 1 Day Post-Op Procedure(s) (LRB): REVERSE SHOULDER ARTHROPLASTY (Left) Patient reports pain as mild.   Patient is well, and has had no acute complaints or problems PT and Care management to assist with discharge planning. Negative for chest pain and shortness of breath Fever: no Gastrointestinal:Negative for nausea and vomiting  Objective: Vital signs in last 24 hours: Temp:  [97.5 F (36.4 C)-99.5 F (37.5 C)] 98.6 F (37 C) (12/14 0333) Pulse Rate:  [53-76] 58 (12/14 0333) Resp:  [14-20] 18 (12/14 0333) BP: (90-140)/(43-70) 111/57 (12/14 0333) SpO2:  [89 %-98 %] 94 % (12/14 0548)  Intake/Output from previous day:  Intake/Output Summary (Last 24 hours) at 01/21/2017 0712 Last data filed at 01/21/2017 0433 Gross per 24 hour  Intake 3498.75 ml  Output 200 ml  Net 3298.75 ml    Intake/Output this shift: No intake/output data recorded.  Labs: Recent Labs    01/19/17 1438 01/21/17 0322  HGB 12.2 10.5*   Recent Labs    01/19/17 1438 01/21/17 0322  WBC 11.4* 9.5  RBC 4.30 3.68*  HCT 36.6 30.9*  PLT 379 299   Recent Labs    01/19/17 1438 01/21/17 0322  NA 135 135  K 3.2* 3.5  CL 96* 99*  CO2 28 28  BUN 14 21*  CREATININE 0.79 0.86  GLUCOSE 143* 144*  CALCIUM 9.2 9.0   Recent Labs    01/19/17 1438  INR 0.99     EXAM General - Patient is Alert, Appropriate, Oriented and resting comfortably this AM Extremity - ABD soft Incision: dressing C/D/I No cellulitis present Dressing/Incision - clean, dry, no drainage Motor Function - intact, moving foot and toes well on exam. Intact to light touch to the left upper extremity.  Pulses intact, able to flex and extend fingers without difficulty.  Abdomen soft with normal BS.  Past Medical History:  Diagnosis Date  . Adopted   . Arthritis    osteoarthritis  . Blood clot in vein 1975  . Cancer ([place])   . Complication of anesthesia    body aches after anesthesia  . Depression   .  Diverticulosis   . GERD (gastroesophageal reflux disease)   . Hepatitis    as a child  . History of bronchitis   . Hypercholesterolemia   . Motion sickness    curvy/[place] roads  . Neuropathy    upper legs  . Osteoporosis   . Skin cancer (melanoma) ([place])    left upper thigh  . Skin cancer of face    basal cell  . Sleep apnea    CPAP  . Tremor   . Wears dentures    full upper and lower    Assessment/Plan: 1 Day Post-Op Procedure(s) (LRB): REVERSE SHOULDER ARTHROPLASTY (Left) Active Problems:   Status post reverse total shoulder replacement, left  Estimated body mass index is 38.94 kg/m as calculated from the following:   Height as of this encounter: 5\' 5"  (1.651 m).   Weight as of this encounter: 106.1 kg (234 lb). Advance diet Up with therapy D/C IV fluids when tolerating po intake.  Labs reviewed this AM. Up with therapy today. Plan for discharge home this afternoon. Due to prior history of blood clot, will discharge home on Lovenox.  DVT Prophylaxis - Lovenox, Foot Pumps and TED hose Non-weightbearing to the left arm.  Raquel Lanique Gonzalo, PA-C [facility] Orthopaedic Surgery 01/21/2017, 7:12 AM

## 2017-01-21 NOTE — Evaluation (Signed)
Occupational Therapy Evaluation Patient Details Name: Annette Quinn MRN: [id number] DOB: 24-Nov-1950 Today's Date: 01/21/2017    History of Present Illness Pt is a 66 y/o with L proximal humerus fracture now s/p L rTSA. Pt's PMH includes tremor, skin cancer, osteoporosis, hepatitis, blood clot.   Clinical Impression   Pt. presents with weakness, LUE immobilization, decreased activity tolerance, and impaired functional mobility which limit her ability to complete ADLs, and IADLs. Pt. has 1LO2 via [place]. Pt. reports having  A CPAP at home for night time use. Pt. Resides at home with her husband. Pt. was independent with ADLs, and IADLs. Pt. was able to drive, and is a retired Glass blower/designer. Pt. Education was provided about A/E use for ADLs, and one armed ADL techniques. Pt. reports having difficulty getting out of a recliner chair. Pt., and husband report they plan to purchase a lift chair. Pt. could benefit from skilled OT services for ADL, and IADL training, and pt. Education about home modification, DME, and work simplification/energy conservation techniques. Pt. plans to return home  With husband, and daughter assistance as needed upon discharge.     Follow Up Recommendations  No OT follow up    Equipment Recommendations       Recommendations for Other Services       Precautions / Restrictions Precautions Precautions: Fall;Other (comment);Shoulder Restrictions Weight Bearing Restrictions: Yes LUE Weight Bearing: Non weight bearing                                                    ADL either performed or assessed with clinical judgement   ADL Overall ADL's : Needs assistance/impaired Eating/Feeding: Set up;Minimal assistance   Grooming: Set up;Minimal assistance   Upper Body Bathing: Maximal assistance   Lower Body Bathing: Modified independent   Upper Body Dressing : Maximal assistance   Lower Body Dressing: Moderate assistance                Functional mobility during ADLs: Minimal assistance General ADL Comments: Pt. education was provided about A/E use for ADLs.      Vision         Perception     Praxis      Pertinent Vitals/Pain Pain Assessment: No/denies pain Pain Location: Left shoulder  Pain Descriptors / Indicators: Discomfort Pain Intervention(s): Limited activity within patient's tolerance;Monitored during session;Ice applied     Hand Dominance Right   Extremity/Trunk Assessment Upper Extremity Assessment Upper Extremity Assessment: LUE deficits/detail LUE: Unable to fully assess due to immobilization           Communication Communication Communication: No difficulties   Cognition Arousal/Alertness: Awake/alert Behavior During Therapy: WFL for tasks assessed/performed Overall Cognitive Status: Within Functional Limits for tasks assessed                                     General Comments       Exercises     Shoulder Instructions      Home Living Family/patient expects to be discharged to:: Private residence Living Arrangements: Spouse/significant other;Children Available Help at Discharge: Family;Available 24 hours/day Type of Home: House Home Access: Stairs to enter [employer] of Steps: 4 Entrance Stairs-Rails: Left;Right;Can reach both Home Layout: One level     Bathroom  Shower/Tub: Tub/shower unit;Walk-in shower   Bathroom Toilet: Handicapped height     Home Equipment: Environmental consultant - 4 wheels;Walker - standard;Cane - single point;Shower seat - built in;Grab bars - tub/shower   Additional Comments: Pt has a child with a disability but this child has a personal care attendant      Prior Functioning/Environment Level of Independence: Independent with assistive device(s)                 OT Problem List: Decreased strength;Decreased activity tolerance;Impaired UE functional use;Pain;Decreased knowledge of use of DME or AE      OT  Treatment/Interventions: Self-care/ADL training;Therapeutic exercise;DME and/or AE instruction;Therapeutic activities;Patient/family education    OT Goals(Current goals can be found in the care plan section) Acute Rehab OT Goals Patient Stated Goal: To return home with family OT Goal Formulation: With patient Potential to Achieve Goals: Good  OT Frequency: Min 1X/week   Barriers to D/C:            Co-evaluation              AM-PAC PT "6 Clicks" Daily Activity     Outcome Measure Help from another person eating meals?: A Little Help from another person taking care of personal grooming?: A Little Help from another person toileting, which includes using toliet, bedpan, or urinal?: A Little Help from another person bathing (including washing, rinsing, drying)?: A Lot Help from another person to put on and taking off regular upper body clothing?: A Lot Help from another person to put on and taking off regular lower body clothing?: A Lot 6 Click Score: 15   End of Session    Activity Tolerance: Patient tolerated treatment well Patient left: with bed alarm set;in bed;with call bell/phone within reach  OT Visit Diagnosis: Muscle weakness (generalized) (M62.81);History of falling (Z91.81)                Time: 1505-6979 OT Time Calculation (min): 20 min Charges:  OT General Charges $OT Visit: 1 Visit OT Evaluation $OT Eval Moderate Complexity: 1 Mod G-Codes: OT G-codes **NOT FOR INPATIENT CLASS** Functional Limitation: Self care Self Care Current Status (Y8016): At least 20 percent but less than 40 percent impaired, limited or restricted Self Care Goal Status (P5374): 0 percent impaired, limited or restricted   Harrel Carina, MS, OTR/L   Harrel Carina, MS, OTR/L 01/21/2017, 10:03 AM

## 2017-01-21 NOTE — Progress Notes (Signed)
Clinical Education officer, museum (CSW) received SNF consult. PT is recommending home health. Patient will D/C home today. RN case manager aware of above. Please reconsult if future social work needs arise. CSW signing off.   [employer], LCSW [phone]

## 2017-01-21 NOTE — Discharge Summary (Signed)
Physician Discharge Summary  Patient ID: Annette Quinn MRN: [id number] DOB/AGE: 65-Oct-1952 65 y.o.  Admit date: 01/20/2017 Discharge date: 01/21/2017  Admission Diagnoses:  other closed displaced fracture of proximal end of left humerus  Discharge Diagnoses: Patient Active Problem List   Diagnosis Date Noted  . Status post reverse total shoulder replacement, left 01/20/2017  Closed displaced left proximal humerus fracture  Past Medical History:  Diagnosis Date  . Adopted   . Arthritis    osteoarthritis  . Blood clot in vein 1975  . Cancer ([place])   . Complication of anesthesia    body aches after anesthesia  . Depression   . Diverticulosis   . GERD (gastroesophageal reflux disease)   . Hepatitis    as a child  . History of bronchitis   . Hypercholesterolemia   . Motion sickness    curvy/hilly roads  . Neuropathy    upper legs  . Osteoporosis   . Skin cancer (melanoma) ([place])    left upper thigh  . Skin cancer of face    basal cell  . Sleep apnea    CPAP  . Tremor   . Wears dentures    full upper and lower   Transfusion: None.   Consultants (if any):   Discharged Condition: Improved  Hospital Course: Annette Quinn is an 66 y.o. female who was admitted 01/20/2017 with a diagnosis of a closed displaced left proximal humerus fracture and went to the operating room on 01/20/2017 and underwent the above named procedures.    Surgeries: Procedure(s): REVERSE SHOULDER ARTHROPLASTY on 01/20/2017 Patient tolerated the surgery well. Taken to PACU where she was stabilized and then transferred to the orthopedic floor.  Started on Lovenox 40mg  q 24 hrs. Foot pumps applied bilaterally at 80 mm. Heels elevated on bed with rolled towels. No evidence of DVT. Negative [place]. Physical therapy started on day #1 for gait training and transfer. OT started day #1 for ADL and assisted devices.  Patient's IV was d/c on POD1  Implants: All press-fit Integra system with a #10  humeral stem, a short metaphyseal component with a +6 mm insert, and a 15 mm base plate with a +2 mm laterally offset 38 mm concentric glenosphere.  She was given perioperative antibiotics:  Anti-infectives (From admission, onward)   Start     Dose/Rate Route Frequency Ordered Stop   01/20/17 1400  clindamycin (CLEOCIN) IVPB 900 mg     900 mg 100 mL/hr over 30 Minutes Intravenous Every 6 hours 01/20/17 1154 01/21/17 0302   01/20/17 0600  clindamycin (CLEOCIN) 900 MG/50ML IVPB    Comments:  Annette Quinn   : cabinet override      01/20/17 0600 01/20/17 0750   01/19/17 2315  clindamycin (CLEOCIN) IVPB 900 mg     900 mg 100 mL/hr over 30 Minutes Intravenous  Once 01/19/17 2314 01/20/17 0750    .  She was given sequential compression devices, early ambulation, and lovenox for DVT prophylaxis.  She benefited maximally from the hospital stay and there were no complications.    Recent vital signs:  Vitals:   01/21/17 0543 01/21/17 0548  BP:    Pulse:    Resp:    Temp:    SpO2: 91% 94%    Recent laboratory studies:  Lab Results  Component Value Date   HGB 10.5 (L) 01/21/2017   HGB 12.2 01/19/2017   Lab Results  Component Value Date   WBC 9.5 01/21/2017   PLT 299  01/21/2017   Lab Results  Component Value Date   INR 0.99 01/19/2017   Lab Results  Component Value Date   NA 135 01/21/2017   K 3.5 01/21/2017   CL 99 (L) 01/21/2017   CO2 28 01/21/2017   BUN 21 (H) 01/21/2017   CREATININE 0.86 01/21/2017   GLUCOSE 144 (H) 01/21/2017    Discharge Medications:   Allergies as of 01/21/2017      Reactions   Actonel [risedronate Sodium]    Boniva [ibandronic Acid]    Fosamax [alendronate]    Severe muscle aches   Lopid [gemfibrozil]    headache   Penicillins Other (See Comments)   (injectable only) - knot at site Has patient had a PCN reaction causing immediate rash, facial/tongue/throat swelling, SOB or lightheadedness with hypotension:NO Has patient had a PCN  reaction causing severe rash involving mucus membranes or skin necrosis: No Has patient had a PCN reaction that required hospitalization: No Has patient had a PCN reaction occurring within the last 10 years: No If all of the above answers are "NO", then may proceed with Cephalosporin use.   Reclast [zoledronic Acid]    Severe muscle aches   Strawberry (diagnostic) Itching   Tape    Tears skin (tegaderm and paper tape OK)   Doxycycline Rash      Medication List    STOP taking these medications   oxyCODONE-acetaminophen 5-325 MG tablet Commonly known as:  ROXICET     TAKE these medications   acetaminophen 650 MG CR tablet Commonly known as:  TYLENOL Take 650-1,300 mg by mouth every 8 (eight) hours as needed for pain.   albuterol 108 (90 Base) MCG/ACT inhaler Commonly known as:  PROVENTIL HFA;VENTOLIN HFA Inhale 2 puffs into the lungs every 6 (six) hours as needed for wheezing or shortness of breath.   AUVI-Q 0.3 mg/0.3 mL Soaj injection Generic drug:  EPINEPHrine Inject 0.3 mg into the muscle daily as needed (for anaphylatic allergic reaction).   Azelastine-Fluticasone 137-50 MCG/ACT Susp Place 1 spray into the nose 2 (two) times daily as needed (for allergies.).   Betamethasone Valerate 0.12 % foam Apply 1 application topically as needed (eczema).   bumetanide 1 MG tablet Commonly known as:  BUMEX Take 1 mg by mouth 2 (two) times daily.   busPIRone 7.5 MG tablet Commonly known as:  BUSPAR Take 7.5 mg by mouth 2 (two) times daily.   CALCIUM PO Take 1 tablet by mouth daily.   Cholecalciferol 2000 units Caps Take 2,000 Units by mouth daily before breakfast.   diclofenac sodium 1 % Gel Commonly known as:  VOLTAREN Apply 2-4 g topically 4 (four) times daily as needed (for pain.).   enoxaparin 40 MG/0.4ML injection Commonly known as:  LOVENOX Inject 0.4 mLs (40 mg total) into the skin daily.   fenofibrate 160 MG tablet Take 160 mg by mouth daily at 12 noon.    Fluocinolone Acetonide 0.01 % Oil Apply 1 application topically as needed (eczema).   gabapentin 300 MG capsule Commonly known as:  NEURONTIN Take 600 mg by mouth 3 (three) times daily.   GLUCOSAMINE-CHONDROITIN PO Take 1 tablet by mouth daily.   hydrocortisone 2.5 % cream Apply 1 application topically 2 (two) times daily as needed ((typically uses once daily)). Mix equal parts with ketoconazole 2% applied affected area(s) on face   ketoconazole 2 % cream Commonly known as:  NIZORAL Apply 1 application topically 2 (two) times daily as needed for irritation ((typically uses once daily)).  Mix equal parts with hydrocortisone 2.5% applied affected area(s) on face   montelukast 10 MG tablet Commonly known as:  SINGULAIR Take 10 mg by mouth every morning.   oxyCODONE 5 MG immediate release tablet Commonly known as:  Oxy IR/ROXICODONE Take 1-2 tablets (5-10 mg total) by mouth every 4 (four) hours as needed for severe pain. What changed:    medication strength  how much to take  reasons to take this   pantoprazole 40 MG tablet Commonly known as:  PROTONIX Take 40 mg by mouth See admin instructions. Take 1 tablet (40 mg) scheduled every morning; may take additional dose later if needed   propranolol 20 MG tablet Commonly known as:  INDERAL Take 20 mg by mouth 2 (two) times daily.   raloxifene 60 MG tablet Commonly known as:  EVISTA Take 60 mg by mouth daily at 12 noon.   sertraline 100 MG tablet Commonly known as:  ZOLOFT Take 200 mg by mouth daily with lunch.   traMADol 50 MG tablet Commonly known as:  ULTRAM Take 1-2 tablets (50-100 mg total) by mouth every 6 (six) hours as needed.       Diagnostic Studies: Dg Elbow 2 Views Left  Result Date: 01/13/2017 CLINICAL DATA:  66 year old female status post trip and fall with severe pain. EXAM: LEFT ELBOW - 2 VIEW COMPARISON:  Left shoulder series today reported separately. FINDINGS: The lateral view is oblique. No  definite joint effusion. The distal left humerus appears intact. Joint spaces and alignment are preserved at the left elbow. Proximal radius and ulna appear intact. IMPRESSION: No acute fracture or dislocation identified about the left elbow. Electronically Signed   By: Genevie Ann M.D.   On: 01/13/2017 17:32   Ct Shoulder Left Wo Contrast  Result Date: 01/13/2017 CLINICAL DATA:  Left shoulder fracture. EXAM: CT OF THE UPPER LEFT EXTREMITY WITHOUT CONTRAST TECHNIQUE: Multidetector CT imaging of the upper left extremity was performed according to the standard protocol. COMPARISON:  Radiographs from earlier on the same day. FINDINGS: Bones/Joint/Cartilage Acute, comminuted, fracture of the proximal humerus involving the surgical neck and greater tuberosity with impaction of the humeral neck on the humeral head. 51 degrees of dorsal angulation is noted of the humeral head relative to the humeral shaft. 5 mm of displacement of the greater tuberosity is noted relative to the head. No glenoid fracture. AC joint osteoarthritis with undersurface spurring. The visualized adjacent ribs are intact. Ligaments Suboptimally assessed by CT. Muscles and Tendons Do soft tissue edema of the overlying deltoid. No abnormal fluid collections. Soft tissues Soft tissue induration consistent with soft tissue contusion from fracture. IMPRESSION: Apex anterior angulated surgical neck fracture by approximately 51 degrees with impaction on the humeral head. Slightly displaced greater tuberosity fracture by 5 mm. Electronically Signed   By: Ashley Royalty M.D.   On: 01/13/2017 20:26   Dg Shoulder Left  Result Date: 01/13/2017 CLINICAL DATA:  66 year old female status post trip and fall. Severe pain. EXAM: LEFT SHOULDER - 2+ VIEW COMPARISON:  None. FINDINGS: Comminuted, impacted, and mildly displaced proximal left humerus fracture. The greater tuberosity constitutes a butterfly fragment with mild displacement. No superimposed glenohumeral joint  dislocation. The left clavicle and scapula appear to remain intact. Visible left ribs appear intact. IMPRESSION: Comminuted, impacted and mildly displaced proximal left humerus fracture. Electronically Signed   By: Genevie Ann M.D.   On: 01/13/2017 17:31   Dg Shoulder Left Port  Result Date: 01/20/2017 CLINICAL DATA:  Left shoulder replacement  surgery EXAM: LEFT SHOULDER - 1 VIEW COMPARISON:  CT 01/13/2017 FINDINGS: Changes of left shoulder replacement. Again noted are fractures in the left humeral neck region. Normal AP alignment. No hardware complicating feature. IMPRESSION: Left shoulder replacement. No hardware complicating feature. Fractures again noted in the left humeral neck region. Electronically Signed   By: Rolm Baptise M.D.   On: 01/20/2017 11:21   Disposition:  Plan will be for discharge home today following morning session of PT.  Follow-up Information    Lattie Corns, PA-C Follow up in 10 day(s).   Specialty:  Physician Assistant Why:  Electa Sniff information: [place] [id number] [phone]          Signed: Judson Roch PA-C 01/21/2017, 7:17 AM

## 2017-01-24 LAB — SURGICAL PATHOLOGY

## 2017-06-01 IMAGING — MG MM DIGITAL SCREENING BILAT W/ TOMO W/ CAD
8 of 14 series · 8 of 30 positions shown · non-contrast
Comparison: Previous exam(s).

CLINICAL DATA: Screening.

EXAM:
2D DIGITAL SCREENING BILATERAL MAMMOGRAM WITH CAD AND ADJUNCT TOMO

[L XCCL]
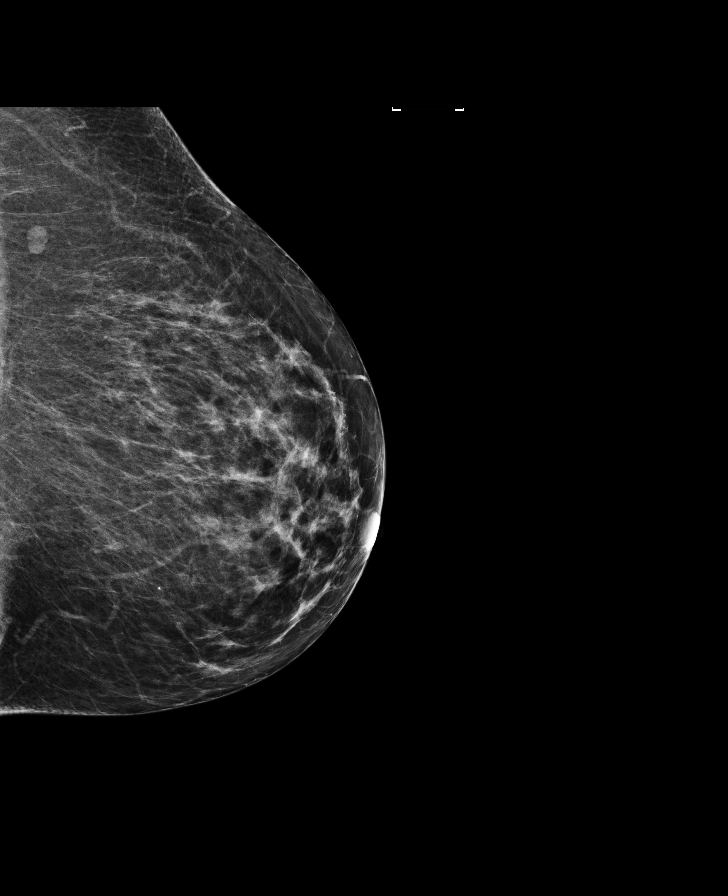

[L CC (1 of 2)]
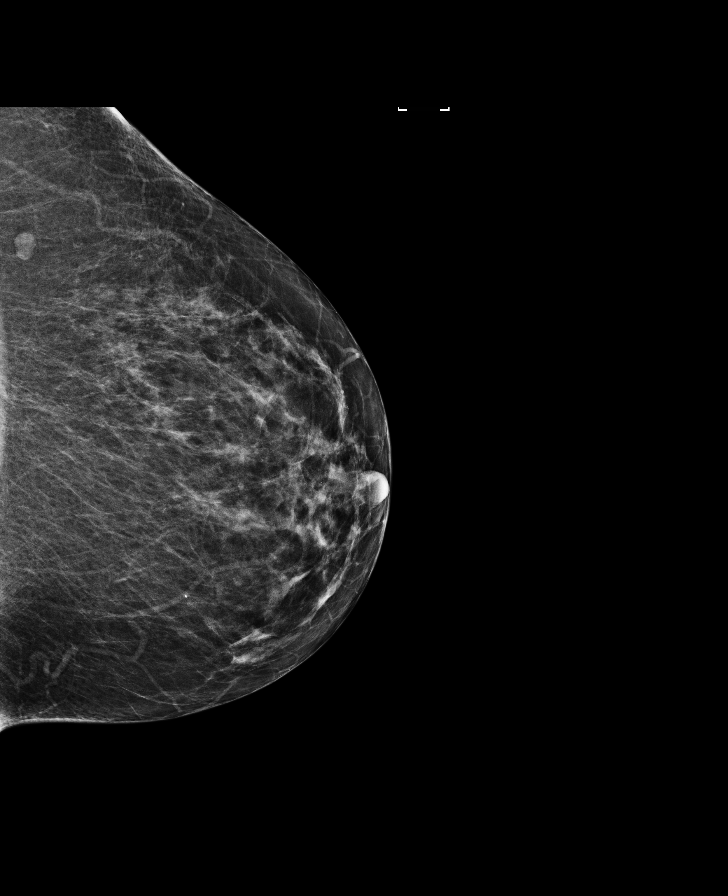

[L MLO synth-2D]
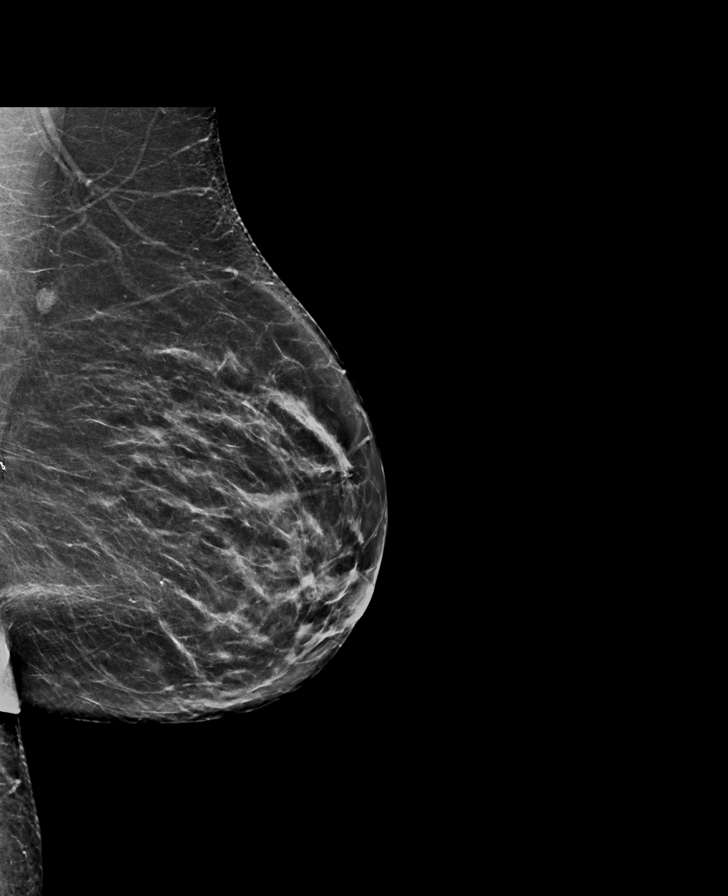

[L CC (2 of 2)]
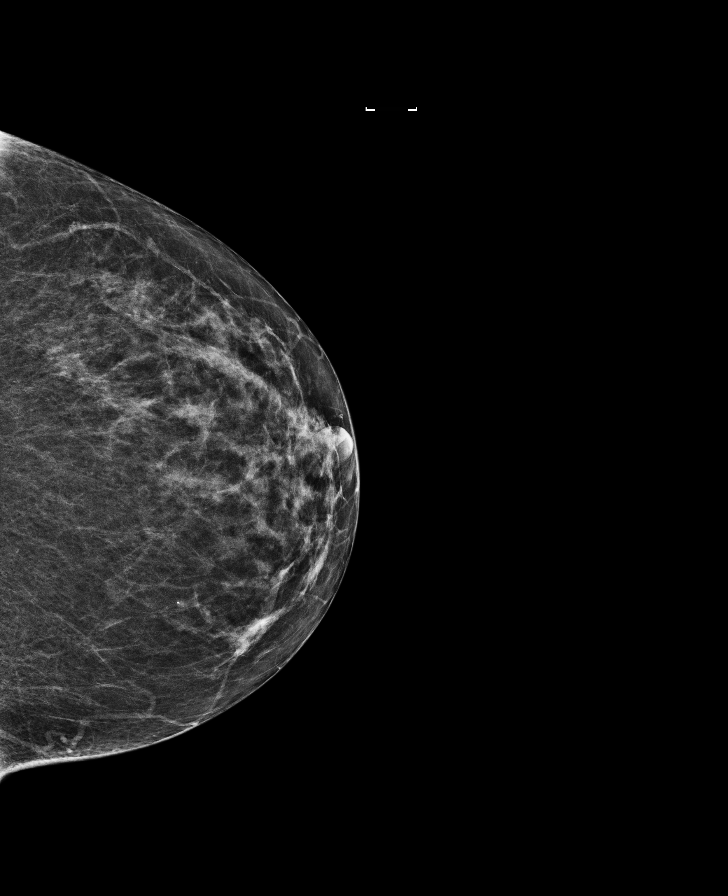

[R CC synth-2D]
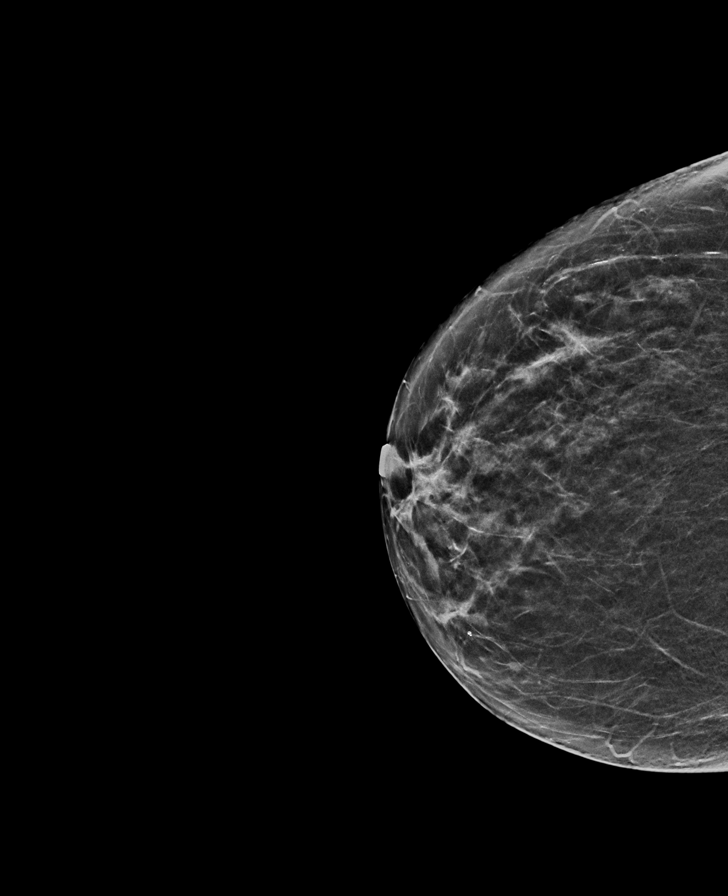

[R CC]
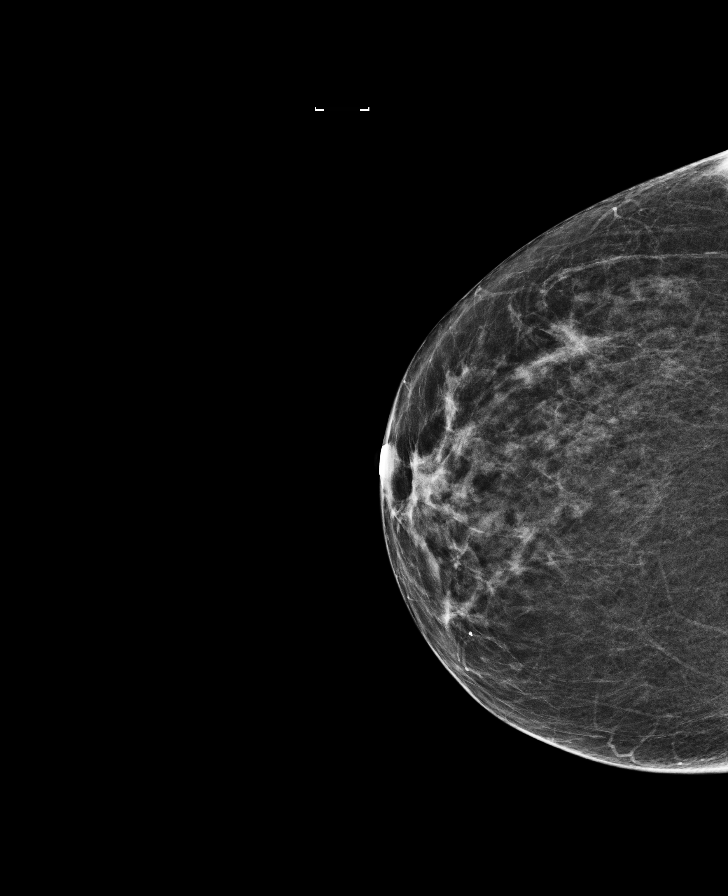

[R MLO synth-2D]
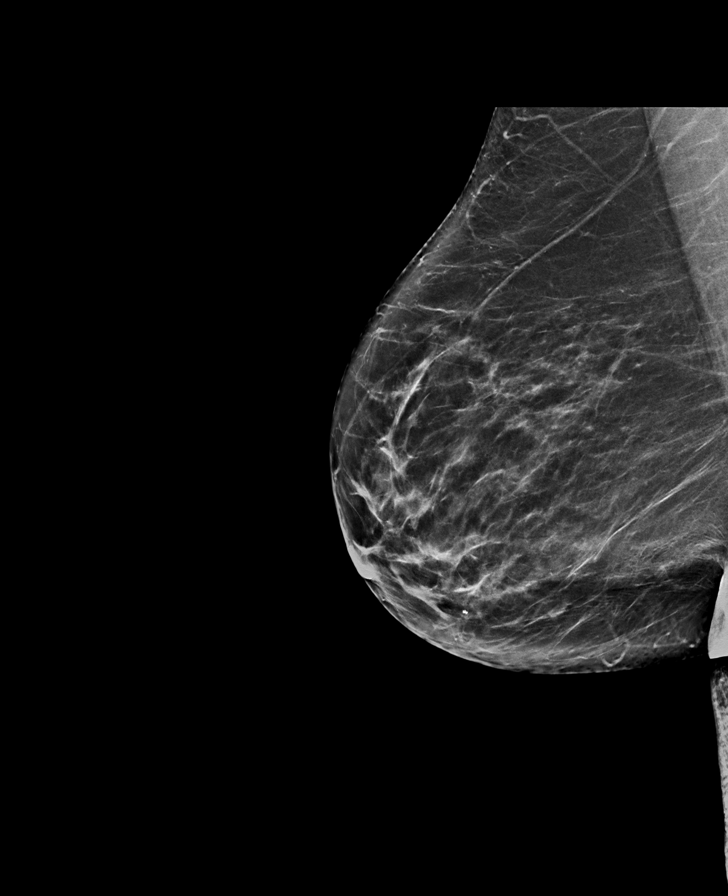

[L MLO]
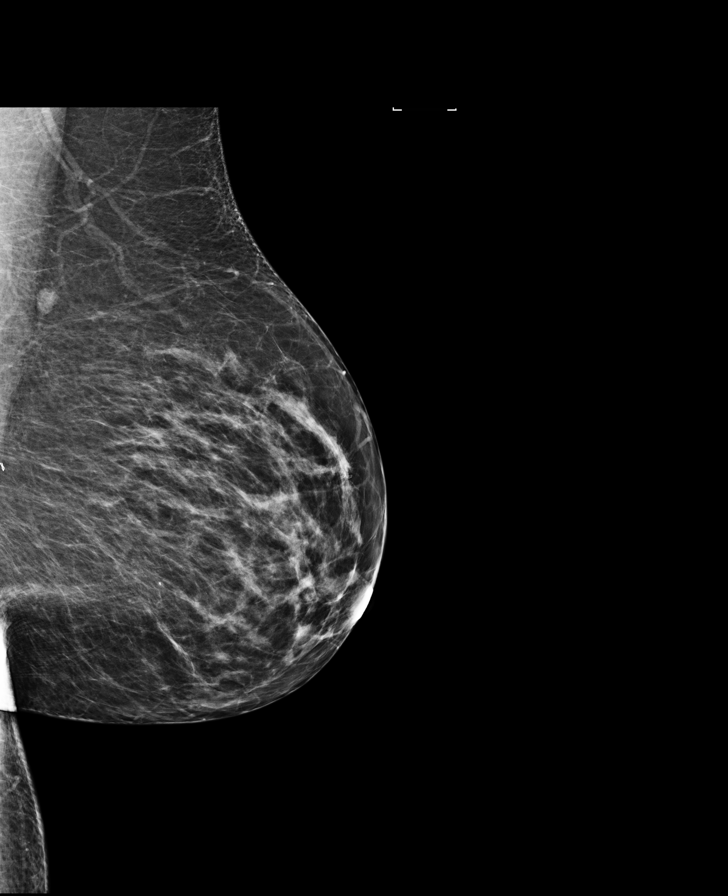

[8 of 30 positions shown; findings below may reference images not displayed]

ACR Breast Density Category b: There are scattered areas of
fibroglandular density.
FINDINGS: There are no findings suspicious for malignancy. Images were
processed with CAD.
IMPRESSION: No mammographic evidence of malignancy. A result letter of this
screening mammogram will be mailed directly to the patient.

RECOMMENDATION:
Screening mammogram in one year. (Code:97-6-RS4)

BI-RADS CATEGORY  1: Negative.

## 2017-06-14 ENCOUNTER — Other Ambulatory Visit: Payer: Self-pay | Admitting: Internal Medicine

## 2017-06-14 DIAGNOSIS — Z1231 Encounter for screening mammogram for malignant neoplasm of breast: Secondary | ICD-10-CM

## 2017-07-05 ENCOUNTER — Ambulatory Visit
Admission: RE | Admit: 2017-07-05 | Discharge: 2017-07-05 | Disposition: A | Discharge status: Home / Self Care | Payer: Managed Care, Other (non HMO) | Source: Ambulatory Visit | Attending: Internal Medicine | Admitting: Internal Medicine

## 2017-07-05 DIAGNOSIS — Z1231 Encounter for screening mammogram for malignant neoplasm of breast: Secondary | ICD-10-CM | POA: Insufficient documentation

## 2018-06-22 ENCOUNTER — Other Ambulatory Visit: Payer: Self-pay | Admitting: Internal Medicine

## 2018-06-22 DIAGNOSIS — Z1231 Encounter for screening mammogram for malignant neoplasm of breast: Secondary | ICD-10-CM

## 2018-07-11 ENCOUNTER — Ambulatory Visit
Admission: RE | Admit: 2018-07-11 | Discharge: 2018-07-11 | Disposition: A | Discharge status: Home / Self Care | Payer: 59 | Source: Ambulatory Visit | Attending: Internal Medicine | Admitting: Internal Medicine

## 2018-07-11 ENCOUNTER — Other Ambulatory Visit: Payer: Self-pay

## 2018-07-11 DIAGNOSIS — Z1231 Encounter for screening mammogram for malignant neoplasm of breast: Secondary | ICD-10-CM | POA: Insufficient documentation

## 2019-04-04 ENCOUNTER — Other Ambulatory Visit (HOSPITAL_COMMUNITY): Payer: Self-pay | Admitting: Internal Medicine

## 2019-04-04 ENCOUNTER — Other Ambulatory Visit: Payer: Self-pay | Admitting: Internal Medicine

## 2019-04-04 DIAGNOSIS — G8929 Other chronic pain: Secondary | ICD-10-CM

## 2019-04-06 ENCOUNTER — Ambulatory Visit: Payer: Medicare Other | Attending: Internal Medicine

## 2019-04-06 DIAGNOSIS — Z23 Encounter for immunization: Secondary | ICD-10-CM | POA: Insufficient documentation

## 2019-04-06 NOTE — Progress Notes (Signed)
   Covid-19 Vaccination Clinic  Name:  Annette Quinn    MRN: [id number] DOB: 1950-03-22  04/06/2019  [name] was observed post Covid-19 immunization for 15 minutes without incidence. She was provided with Vaccine Information Sheet and instruction to access the V-Safe system.   [name] was instructed to call 911 with any severe reactions post vaccine: Marland Kitchen Difficulty breathing  . Swelling of your face and throat  . A fast heartbeat  . A bad rash all over your body  . Dizziness and weakness    Immunizations Administered    Name Date Dose VIS Date Route   Pfizer COVID-19 Vaccine 04/06/2019  9:52 AM 0.3 mL 01/19/2019 Intramuscular   Manufacturer: [place]   Lot: [id number]   [place]: [id number]

## 2019-04-11 DIAGNOSIS — F325 Major depressive disorder, single episode, in full remission: Secondary | ICD-10-CM | POA: Diagnosis not present

## 2019-04-11 DIAGNOSIS — R200 Anesthesia of skin: Secondary | ICD-10-CM | POA: Diagnosis not present

## 2019-04-15 ENCOUNTER — Ambulatory Visit
Admission: RE | Admit: 2019-04-15 | Discharge: 2019-04-15 | Disposition: A | Discharge status: Home / Self Care | Payer: Medicare HMO | Source: Ambulatory Visit | Attending: Internal Medicine | Admitting: Internal Medicine

## 2019-04-15 ENCOUNTER — Other Ambulatory Visit: Payer: Self-pay

## 2019-04-15 DIAGNOSIS — G8929 Other chronic pain: Secondary | ICD-10-CM | POA: Diagnosis not present

## 2019-04-15 DIAGNOSIS — M545 Low back pain, unspecified: Secondary | ICD-10-CM

## 2019-04-17 ENCOUNTER — Other Ambulatory Visit: Payer: Self-pay | Admitting: Neurology

## 2019-04-17 DIAGNOSIS — R2689 Other abnormalities of gait and mobility: Secondary | ICD-10-CM

## 2019-04-23 DIAGNOSIS — M5136 Other intervertebral disc degeneration, lumbar region: Secondary | ICD-10-CM | POA: Diagnosis not present

## 2019-04-23 DIAGNOSIS — M47816 Spondylosis without myelopathy or radiculopathy, lumbar region: Secondary | ICD-10-CM | POA: Diagnosis not present

## 2019-04-23 DIAGNOSIS — M62830 Muscle spasm of back: Secondary | ICD-10-CM | POA: Diagnosis not present

## 2019-04-23 DIAGNOSIS — Z6841 Body Mass Index (BMI) 40.0 and over, adult: Secondary | ICD-10-CM | POA: Diagnosis not present

## 2019-04-26 ENCOUNTER — Ambulatory Visit
Admission: RE | Admit: 2019-04-26 | Discharge: 2019-04-26 | Disposition: A | Discharge status: Home / Self Care | Payer: Medicare HMO | Source: Ambulatory Visit | Attending: Neurology | Admitting: Neurology

## 2019-04-26 ENCOUNTER — Other Ambulatory Visit: Payer: Self-pay

## 2019-04-26 DIAGNOSIS — R2689 Other abnormalities of gait and mobility: Secondary | ICD-10-CM | POA: Insufficient documentation

## 2019-04-26 DIAGNOSIS — S0990XA Unspecified injury of head, initial encounter: Secondary | ICD-10-CM | POA: Diagnosis not present

## 2019-04-30 ENCOUNTER — Other Ambulatory Visit
Admission: RE | Admit: 2019-04-30 | Discharge: 2019-04-30 | Disposition: A | Discharge status: Home / Self Care | Payer: Medicare HMO | Source: Ambulatory Visit | Attending: Internal Medicine | Admitting: Internal Medicine

## 2019-04-30 ENCOUNTER — Other Ambulatory Visit: Payer: Self-pay

## 2019-04-30 DIAGNOSIS — Z79899 Other long term (current) drug therapy: Secondary | ICD-10-CM | POA: Diagnosis not present

## 2019-04-30 DIAGNOSIS — Z87891 Personal history of nicotine dependence: Secondary | ICD-10-CM | POA: Diagnosis not present

## 2019-04-30 DIAGNOSIS — K640 First degree hemorrhoids: Secondary | ICD-10-CM | POA: Diagnosis not present

## 2019-04-30 DIAGNOSIS — F329 Major depressive disorder, single episode, unspecified: Secondary | ICD-10-CM | POA: Diagnosis not present

## 2019-04-30 DIAGNOSIS — M810 Age-related osteoporosis without current pathological fracture: Secondary | ICD-10-CM | POA: Diagnosis not present

## 2019-04-30 DIAGNOSIS — K6389 Other specified diseases of intestine: Secondary | ICD-10-CM | POA: Diagnosis not present

## 2019-04-30 DIAGNOSIS — Z96612 Presence of left artificial shoulder joint: Secondary | ICD-10-CM | POA: Diagnosis not present

## 2019-04-30 DIAGNOSIS — G4730 Sleep apnea, unspecified: Secondary | ICD-10-CM | POA: Diagnosis not present

## 2019-04-30 DIAGNOSIS — Z20822 Contact with and (suspected) exposure to covid-19: Secondary | ICD-10-CM | POA: Diagnosis not present

## 2019-04-30 DIAGNOSIS — D122 Benign neoplasm of ascending colon: Secondary | ICD-10-CM | POA: Diagnosis not present

## 2019-04-30 DIAGNOSIS — Z86010 Personal history of colonic polyps: Secondary | ICD-10-CM | POA: Diagnosis not present

## 2019-04-30 DIAGNOSIS — Z85820 Personal history of malignant melanoma of skin: Secondary | ICD-10-CM | POA: Diagnosis not present

## 2019-04-30 DIAGNOSIS — Z1211 Encounter for screening for malignant neoplasm of colon: Secondary | ICD-10-CM | POA: Diagnosis not present

## 2019-04-30 DIAGNOSIS — Z7901 Long term (current) use of anticoagulants: Secondary | ICD-10-CM | POA: Diagnosis not present

## 2019-04-30 DIAGNOSIS — G629 Polyneuropathy, unspecified: Secondary | ICD-10-CM | POA: Diagnosis not present

## 2019-04-30 DIAGNOSIS — K219 Gastro-esophageal reflux disease without esophagitis: Secondary | ICD-10-CM | POA: Diagnosis not present

## 2019-04-30 DIAGNOSIS — K5730 Diverticulosis of large intestine without perforation or abscess without bleeding: Secondary | ICD-10-CM | POA: Diagnosis not present

## 2019-04-30 DIAGNOSIS — D120 Benign neoplasm of cecum: Secondary | ICD-10-CM | POA: Diagnosis not present

## 2019-04-30 DIAGNOSIS — D175 Benign lipomatous neoplasm of intra-abdominal organs: Secondary | ICD-10-CM | POA: Diagnosis not present

## 2019-05-01 LAB — SARS CORONAVIRUS 2 (TAT 6-24 HRS): SARS Coronavirus 2: NEGATIVE

## 2019-05-02 ENCOUNTER — Encounter
Admission: RE | Disposition: A | Discharge status: Home / Self Care | Payer: Self-pay | Source: Home / Self Care | Attending: Internal Medicine

## 2019-05-02 ENCOUNTER — Ambulatory Visit
Admission: RE | Admit: 2019-05-02 | Discharge: 2019-05-02 | Disposition: A | Discharge status: Home / Self Care | Payer: Medicare HMO | Attending: Internal Medicine | Admitting: Internal Medicine

## 2019-05-02 ENCOUNTER — Other Ambulatory Visit: Payer: Self-pay

## 2019-05-02 ENCOUNTER — Ambulatory Visit: Payer: Medicare HMO | Admitting: Anesthesiology

## 2019-05-02 DIAGNOSIS — D175 Benign lipomatous neoplasm of intra-abdominal organs: Secondary | ICD-10-CM | POA: Diagnosis not present

## 2019-05-02 DIAGNOSIS — K6389 Other specified diseases of intestine: Secondary | ICD-10-CM | POA: Diagnosis not present

## 2019-05-02 DIAGNOSIS — Z20822 Contact with and (suspected) exposure to covid-19: Secondary | ICD-10-CM | POA: Diagnosis not present

## 2019-05-02 DIAGNOSIS — D122 Benign neoplasm of ascending colon: Secondary | ICD-10-CM | POA: Diagnosis not present

## 2019-05-02 DIAGNOSIS — D120 Benign neoplasm of cecum: Secondary | ICD-10-CM | POA: Diagnosis not present

## 2019-05-02 DIAGNOSIS — K640 First degree hemorrhoids: Secondary | ICD-10-CM | POA: Diagnosis not present

## 2019-05-02 DIAGNOSIS — R197 Diarrhea, unspecified: Secondary | ICD-10-CM | POA: Diagnosis not present

## 2019-05-02 DIAGNOSIS — K5730 Diverticulosis of large intestine without perforation or abscess without bleeding: Secondary | ICD-10-CM | POA: Insufficient documentation

## 2019-05-02 DIAGNOSIS — K649 Unspecified hemorrhoids: Secondary | ICD-10-CM | POA: Diagnosis not present

## 2019-05-02 DIAGNOSIS — D126 Benign neoplasm of colon, unspecified: Secondary | ICD-10-CM | POA: Diagnosis not present

## 2019-05-02 DIAGNOSIS — G629 Polyneuropathy, unspecified: Secondary | ICD-10-CM | POA: Insufficient documentation

## 2019-05-02 DIAGNOSIS — K635 Polyp of colon: Secondary | ICD-10-CM | POA: Diagnosis not present

## 2019-05-02 DIAGNOSIS — Z1211 Encounter for screening for malignant neoplasm of colon: Secondary | ICD-10-CM | POA: Diagnosis not present

## 2019-05-02 DIAGNOSIS — Z85820 Personal history of malignant melanoma of skin: Secondary | ICD-10-CM | POA: Insufficient documentation

## 2019-05-02 DIAGNOSIS — M810 Age-related osteoporosis without current pathological fracture: Secondary | ICD-10-CM | POA: Insufficient documentation

## 2019-05-02 DIAGNOSIS — Z96612 Presence of left artificial shoulder joint: Secondary | ICD-10-CM | POA: Insufficient documentation

## 2019-05-02 DIAGNOSIS — F329 Major depressive disorder, single episode, unspecified: Secondary | ICD-10-CM | POA: Insufficient documentation

## 2019-05-02 DIAGNOSIS — K219 Gastro-esophageal reflux disease without esophagitis: Secondary | ICD-10-CM | POA: Insufficient documentation

## 2019-05-02 DIAGNOSIS — Z86010 Personal history of colonic polyps: Secondary | ICD-10-CM | POA: Insufficient documentation

## 2019-05-02 DIAGNOSIS — Z87891 Personal history of nicotine dependence: Secondary | ICD-10-CM | POA: Insufficient documentation

## 2019-05-02 DIAGNOSIS — Z79899 Other long term (current) drug therapy: Secondary | ICD-10-CM | POA: Insufficient documentation

## 2019-05-02 DIAGNOSIS — Z7901 Long term (current) use of anticoagulants: Secondary | ICD-10-CM | POA: Insufficient documentation

## 2019-05-02 DIAGNOSIS — K5790 Diverticulosis of intestine, part unspecified, without perforation or abscess without bleeding: Secondary | ICD-10-CM | POA: Diagnosis not present

## 2019-05-02 DIAGNOSIS — G4730 Sleep apnea, unspecified: Secondary | ICD-10-CM | POA: Insufficient documentation

## 2019-05-02 HISTORY — PX: COLONOSCOPY WITH PROPOFOL: SHX5780

## 2019-05-02 SURGERY — COLONOSCOPY WITH PROPOFOL
Anesthesia: General

## 2019-05-02 MED ORDER — PROPOFOL 10 MG/ML IV BOLUS
INTRAVENOUS | Status: AC
  Filled 2019-05-02: qty 40

## 2019-05-02 MED ORDER — LIDOCAINE HCL (CARDIAC) PF 100 MG/5ML IV SOSY
PREFILLED_SYRINGE | INTRAVENOUS | Status: DC | PRN
  Administered 2019-05-02: 40 mg via INTRAVENOUS

## 2019-05-02 MED ORDER — EPHEDRINE SULFATE 50 MG/ML IJ SOLN
INTRAMUSCULAR | Status: DC | PRN
  Administered 2019-05-02: 10 mg via INTRAVENOUS

## 2019-05-02 MED ORDER — SODIUM CHLORIDE 0.9 % IV SOLN
INTRAVENOUS | Status: DC
  Administered 2019-05-02: 1000 mL via INTRAVENOUS

## 2019-05-02 MED ORDER — PROPOFOL 500 MG/50ML IV EMUL
INTRAVENOUS | Status: DC | PRN
  Administered 2019-05-02: 20 mg via INTRAVENOUS
  Administered 2019-05-02: 125 ug/kg/min via INTRAVENOUS
  Administered 2019-05-02: 30 mg via INTRAVENOUS
  Administered 2019-05-02: 20 mg via INTRAVENOUS

## 2019-05-02 NOTE — Transfer of Care (Signed)
Immediate Anesthesia Transfer of Care Note  Patient: Annette Quinn  Procedure(s) Performed: COLONOSCOPY WITH PROPOFOL (N/A )  Patient Location: PACU  Anesthesia Type:MAC  Level of Consciousness: awake and alert   Airway & Oxygen Therapy: Patient Spontanous Breathing  Post-op Assessment: Report given to RN  Post vital signs: stable  Last Vitals:  Vitals Value Taken Time  BP    Temp    Pulse    Resp    SpO2      Last Pain:  Vitals:   05/02/19 0736  TempSrc: Temporal  PainSc: 0-No pain         Complications: No apparent anesthesia complications

## 2019-05-02 NOTE — Anesthesia Postprocedure Evaluation (Signed)
Anesthesia Post Note  Patient: Annette Quinn  Procedure(s) Performed: COLONOSCOPY WITH PROPOFOL (N/A )  Patient location during evaluation: PACU Anesthesia Type: General Level of consciousness: awake and alert Pain management: pain level controlled Vital Signs Assessment: post-procedure vital signs reviewed and stable Respiratory status: spontaneous breathing, nonlabored ventilation and respiratory function stable Cardiovascular status: blood pressure returned to baseline and stable Postop Assessment: no apparent nausea or vomiting Anesthetic complications: no     Last Vitals:  Vitals:   05/02/19 0939 05/02/19 0948  BP: 132/71 133/69  Pulse: 60 (!) 57  Resp: 18 (!) 25  Temp:    SpO2: 98% 98%    Last Pain:  Vitals:   05/02/19 0948  TempSrc:   PainSc: 0-No pain                 Tera Mater

## 2019-05-02 NOTE — Anesthesia Preprocedure Evaluation (Addendum)
Anesthesia Evaluation  Patient identified by MRN, date of birth, ID band Patient awake    Reviewed: Allergy & Precautions, H&P , NPO status , Patient's Chart, lab work & pertinent test results  History of Anesthesia Complications (+) history of anesthetic complications ("body aches")  Airway Mallampati: II  TM Distance: >3 FB Neck ROM: full    Dental  (+) Upper Dentures, Lower Dentures   Pulmonary sleep apnea , neg COPD, former smoker,           Cardiovascular (-) angina(-) Past MI negative cardio ROS  (-) dysrhythmias      Neuro/Psych PSYCHIATRIC DISORDERS Depression negative neurological ROS     GI/Hepatic Neg liver ROS, GERD  ,  Endo/Other  negative endocrine ROS  Renal/GU negative Renal ROS  negative genitourinary   Musculoskeletal   Abdominal   Peds  Hematology negative hematology ROS (+)   Anesthesia Other Findings Past Medical History: No date: Adopted No date: Arthritis     Comment:  osteoarthritis 1975: Blood clot in vein No date: Cancer ([place]) No date: Complication of anesthesia     Comment:  body aches after anesthesia No date: Depression No date: Diverticulosis No date: GERD (gastroesophageal reflux disease) No date: Hepatitis     Comment:  as a child No date: History of bronchitis No date: Hypercholesterolemia No date: Motion sickness     Comment:  curvy/hilly roads No date: Neuropathy     Comment:  upper legs No date: Osteoporosis No date: Skin cancer (melanoma) ([place])     Comment:  left upper thigh No date: Skin cancer of face     Comment:  basal cell No date: Sleep apnea     Comment:  CPAP No date: Tremor No date: Wears dentures     Comment:  full upper and lower  Past Surgical History: 1979: ABDOMINAL HYSTERECTOMY 1992: BLADDER SUSPENSION 2010: BREAST BIOPSY; Left     Comment:  benign 12/08/2016: CATARACT EXTRACTION W/PHACO; Right     Comment:  Procedure: CATARACT EXTRACTION  PHACO AND INTRAOCULAR               LENS PLACEMENT (IOC)-RIGHT;  Surgeon: Birder Robson,               MD;  Location: [facility];  Service: Ophthalmology;                Laterality: Right;  [place] 00:56.6 AP% 11.5 CDE 6.52 Fluid              Pack lot # [id number] 01/04/2017: CATARACT EXTRACTION W/PHACO; Left     Comment:  Procedure: CATARACT EXTRACTION PHACO AND INTRAOCULAR               LENS PLACEMENT (IOC);  Surgeon: Birder Robson, MD;                Location: [facility];  Service: Ophthalmology;  Laterality:              Left;  [place] 00:33.9 AP% 14.5 CDE 4.91 Fluid Pack lot #               [id number] 2010, 2015: COLONOSCOPY 1979: DILATION AND CURETTAGE OF UTERUS 08/26/2016: DISTAL INTERPHALANGEAL JOINT FUSION; Left     Comment:  Procedure: DISTAL INTERPHALANGEAL JOINT FUSION-LEFT               INDEX FINGER;  Surgeon: Hessie Knows, MD;  Location:  [facility];  Service: Orthopedics;  Laterality: Left; 1989: HAMMER TOE SURGERY; Left 03/31/2016: HAMMER TOE SURGERY; Right     Comment:  Procedure: HAMMER TOE CORRECTION RIGHT T6 T7;  Surgeon:               Samara Deist, DPM;  Location: [facility];                Service: Podiatry;  Laterality: Right;  sleep apnea 03/05/2014: KNEE ARTHROSCOPY W/ PARTIAL MEDIAL MENISCECTOMY; Right     Comment:  medial and lateral.  Abrasion hondroplasty of medial               femoral condyle and femorla trochlea. No date: NASAL SEPTUM SURGERY 01/20/2017: REVERSE SHOULDER ARTHROPLASTY; Left     Comment:  Procedure: REVERSE SHOULDER ARTHROPLASTY;  Surgeon:               Corky Mull, MD;  Location: [facility];  Service:               Orthopedics;  Laterality: Left; 1973: TONSILLECTOMY No date: TRIGGER FINGER RELEASE; Left     Comment:  thumb No date: VEIN LIGATION 03/31/2016: WEIL OSTEOTOMY; Right     Comment:  Procedure: WEIL OSTEOTOMY X2 RIGHT FOOT;  Surgeon:               Samara Deist, DPM;  Location: [facility];                 Service: Podiatry;  Laterality: Right;  IV WITH POPLITEAL     Reproductive/Obstetrics negative OB ROS                            Anesthesia Physical Anesthesia Plan  ASA: II  Anesthesia Plan: General   Post-op Pain Management:    Induction:   PONV Risk Score and Plan: Propofol infusion and TIVA  Airway Management Planned: Natural Airway and Nasal Cannula  Additional Equipment:   Intra-op Plan:   Post-operative Plan:   Informed Consent: I have reviewed the patients History and Physical, chart, labs and discussed the procedure including the risks, benefits and alternatives for the proposed anesthesia with the patient or authorized representative who has indicated his/her understanding and acceptance.     Dental Advisory Given  Plan Discussed with: Anesthesiologist  Anesthesia Plan Comments:         Anesthesia Quick Evaluation

## 2019-05-02 NOTE — H&P (Signed)
Outpatient short stay form Pre-procedure 05/02/2019 8:31 AM Annette Vue K. Annette Quinn, M.D.  Primary Physician: Annette Quinn. Annette Quinn, M.D.  Reason for visit:  Functional diarrhea, personal hx of adenomatous colon polyps > 5 yrs ago.  History of present illness:  Patient with chronic diarrhea without inflammatory component presents as well for surveillance of colon polyps. May have 4-7 loose bm's per day. Little to no blood per rectum.     Current Facility-Administered Medications:  .  0.9 %  sodium chloride infusion, , Intravenous, Continuous, [place], Benay Pike, MD, Last Rate: 20 mL/hr at 05/02/19 0745, 1,000 mL at 05/02/19 0745  Medications Prior to Admission  Medication Sig Dispense Refill Last Dose  . acetaminophen (TYLENOL) 650 MG CR tablet Take 650-1,300 mg by mouth every 8 (eight) hours as needed for pain.    Past Week at Unknown time  . albuterol (PROVENTIL HFA;VENTOLIN HFA) 108 (90 Base) MCG/ACT inhaler Inhale 2 puffs into the lungs every 6 (six) hours as needed for wheezing or shortness of breath.    Past Week at Unknown time  . Azelastine-Fluticasone 137-50 MCG/ACT SUSP Place 1 spray into the nose 2 (two) times daily as needed (for allergies.).    Past Week at Unknown time  . Betamethasone Valerate 0.12 % foam Apply 1 application topically as needed (eczema).    Past Week at Unknown time  . bumetanide (BUMEX) 1 MG tablet Take 1 mg by mouth 2 (two) times daily.    05/01/2019 at Unknown time  . busPIRone (BUSPAR) 7.5 MG tablet Take 7.5 mg by mouth 2 (two) times daily.   05/01/2019 at Unknown time  . CALCIUM PO Take 1 tablet by mouth daily.   Past Week at Unknown time  . Cholecalciferol 2000 units CAPS Take 2,000 Units by mouth daily before breakfast.    Past Week at Unknown time  . diclofenac sodium (VOLTAREN) 1 % GEL Apply 2-4 g topically 4 (four) times daily as needed (for pain.).    Past Week at Unknown time  . EPINEPHrine (AUVI-Q) 0.3 mg/0.3 mL IJ SOAJ injection Inject 0.3 mg into the  muscle daily as needed (for anaphylatic allergic reaction).   Past Month at Unknown time  . Fluocinolone Acetonide 0.01 % OIL Apply 1 application topically as needed (eczema).    Past Week at Unknown time  . gabapentin (NEURONTIN) 300 MG capsule Take 600 mg by mouth 3 (three) times daily.    Past Week at Unknown time  . GLUCOSAMINE-CHONDROITIN PO Take 1 tablet by mouth daily.   Past Week at Unknown time  . hydrocortisone 2.5 % cream Apply 1 application topically 2 (two) times daily as needed ((typically uses once daily)). Mix equal parts with ketoconazole 2% applied affected area(s) on face   Past Week at Unknown time  . ketoconazole (NIZORAL) 2 % cream Apply 1 application topically 2 (two) times daily as needed for irritation ((typically uses once daily)). Mix equal parts with hydrocortisone 2.5% applied affected area(s) on face   Past Week at Unknown time  . montelukast (SINGULAIR) 10 MG tablet Take 10 mg by mouth every morning.    05/01/2019 at Unknown time  . oxyCODONE (OXY IR/ROXICODONE) 5 MG immediate release tablet Take 1-2 tablets (5-10 mg total) by mouth every 4 (four) hours as needed for severe pain. 60 tablet 0 Past Week at Unknown time  . pantoprazole (PROTONIX) 40 MG tablet Take 40 mg by mouth See admin instructions. Take 1 tablet (40 mg) scheduled every morning; may take additional  dose later if needed   05/02/2019 at 0600  . propranolol (INDERAL) 20 MG tablet Take 20 mg by mouth 2 (two) times daily.   05/02/2019 at 0600  . raloxifene (EVISTA) 60 MG tablet Take 60 mg by mouth daily at 12 noon.    Past Week at Unknown time  . sertraline (ZOLOFT) 100 MG tablet Take 200 mg by mouth daily with lunch.    Past Week at Unknown time  . traMADol (ULTRAM) 50 MG tablet Take 1-2 tablets (50-100 mg total) by mouth every 6 (six) hours as needed. 40 tablet 0 Past Week at Unknown time  . enoxaparin (LOVENOX) 40 MG/0.4ML injection Inject 0.4 mLs (40 mg total) into the skin daily. 14 Syringe 0   . fenofibrate  160 MG tablet Take 160 mg by mouth daily at 12 noon.         Allergies  Allergen Reactions  . Actonel [Risedronate Sodium]   . Boniva [Ibandronic Acid]   . Fosamax [Alendronate]     Severe muscle aches  . Lopid [Gemfibrozil]     headache  . Penicillins Other (See Comments)    (injectable only) - knot at site Has patient had a PCN reaction causing immediate rash, facial/tongue/throat swelling, SOB or lightheadedness with hypotension:NO Has patient had a PCN reaction causing severe rash involving mucus membranes or skin necrosis: No Has patient had a PCN reaction that required hospitalization: No Has patient had a PCN reaction occurring within the last 10 years: No If all of the above answers are "NO", then may proceed with Cephalosporin use.   Marland Kitchen Reclast [Zoledronic Acid]     Severe muscle aches  . Strawberry (Diagnostic) Itching  . Tape     Tears skin (tegaderm and paper tape OK)  . Doxycycline Rash     Past Medical History:  Diagnosis Date  . Adopted   . Arthritis    osteoarthritis  . Blood clot in vein 1975  . Cancer ([place])   . Complication of anesthesia    body aches after anesthesia  . Depression   . Diverticulosis   . GERD (gastroesophageal reflux disease)   . Hepatitis    as a child  . History of bronchitis   . Hypercholesterolemia   . Motion sickness    curvy/[place] roads  . Neuropathy    upper legs  . Osteoporosis   . Skin cancer (melanoma) ([place])    left upper thigh  . Skin cancer of face    basal cell  . Sleep apnea    CPAP  . Tremor   . Wears dentures    full upper and lower    Review of systems:  Otherwise negative.    Physical Exam  Gen: Alert, oriented. Appears stated age.  HEENT: [place]/AT. PERRLA. Lungs: CTA, no wheezes. CV: RR nl S1, S2. Abd: soft, benign, no masses. BS+ Ext: No edema. Pulses 2+    Planned procedures: Proceed with colonoscopy. The patient understands the nature of the planned procedure, indications, risks,  alternatives and potential complications including but not limited to bleeding, infection, perforation, damage to internal organs and possible oversedation/side effects from anesthesia. The patient agrees and gives consent to proceed.  Please refer to procedure notes for findings, recommendations and patient disposition/instructions.     Annette Quinn K. Annette Quinn, M.D. Gastroenterology 05/02/2019  8:31 AM

## 2019-05-02 NOTE — Interval H&P Note (Signed)
History and Physical Interval Note:  05/02/2019 8:32 AM  Annette Quinn  has presented today for surgery, with the diagnosis of DIARRHEA,HX.OF COLON POLYPS.  The various methods of treatment have been discussed with the patient and family. After consideration of risks, benefits and other options for treatment, the patient has consented to  Procedure(s): COLONOSCOPY WITH PROPOFOL (N/A) as a surgical intervention.  The patient's history has been reviewed, patient examined, no change in status, stable for surgery.  I have reviewed the patient's chart and labs.  Questions were answered to the patient's satisfaction.     [place], [place]

## 2019-05-02 NOTE — Op Note (Signed)
[facility] Gastroenterology Patient Name: Annette Quinn Procedure Date: 05/02/2019 8:28 AM MRN: [id number] Account #: [id number] Date of Birth: 08-15-1950 Admit Type: Outpatient Age: 69 Room: [facility] ENDO ROOM 3 Gender: Female Note Status: Finalized Procedure:             Colonoscopy Indications:           Surveillance: Personal history of adenomatous polyps                         on last colonoscopy > 5 years ago Providers:             Lorie Apley K. Alice Reichert MD, MD Referring MD:          [name]. [name] MD, MD (Referring MD) Medicines:             Propofol per Anesthesia Complications:         No immediate complications. Estimated blood loss:                         Minimal. Procedure:             Pre-Anesthesia Assessment:                        - The risks and benefits of the procedure and the                         sedation options and risks were discussed with the                         patient. All questions were answered and informed                         consent was obtained.                        - Patient identification and proposed procedure were                         verified prior to the procedure by the nurse. The                         procedure was verified in the procedure room.                        - ASA Grade Assessment: III - A patient with severe                         systemic disease.                        - After reviewing the risks and benefits, the patient                         was deemed in satisfactory condition to undergo the                         procedure.                        After obtaining informed consent, the  colonoscope was                         passed under direct vision. Throughout the procedure,                         the patient's blood pressure, pulse, and oxygen                         saturations were monitored continuously. The                         Colonoscope was introduced through the anus and                         advanced to the the cecum, identified by appendiceal                         orifice and ileocecal valve. The colonoscopy was                         technically difficult and complex due to multiple                         diverticula in the colon and possible diverticulitis.                         Successful completion of the procedure was aided by                         withdrawing the scope and replacing with the pediatric                         colonoscope. The patient tolerated the procedure well.                         The quality of the bowel preparation was adequate. The                         ileocecal valve, appendiceal orifice, and rectum were                         photographed. Findings:      The perianal and digital rectal examinations were normal. Pertinent       negatives include normal sphincter tone and no palpable rectal lesions.      Non-bleeding internal hemorrhoids were found during retroflexion. The       hemorrhoids were mild and Grade I (internal hemorrhoids that do not       prolapse).      A 15 mm polyp was found in the cecum. The polyp was carpet-like.       Polypectomy was attempted, initially using a saline injection-lift       technique with a hot snare. Polyp resection was incomplete with this       device. This intervention then required a different device and       polypectomy technique. The polyp was removed with a hot snare. Polyp       resection was incomplete. The resected tissue was retrieved. Hot snare  with braided snare. Resection and retrieval were complete. To prevent       bleeding after the polypectomy, one hemostatic clip was successfully       placed (MR conditional). There was no bleeding during, or at the end, of       the procedure.      A 5 mm polyp was found in the cecum. The polyp was sessile. The polyp       was removed with a jumbo cold forceps. Resection and retrieval were       complete.      A 11  mm polyp was found in the ascending colon. The polyp was       semi-pedunculated. The polyp was removed with a hot snare. Resection and       retrieval were complete. To prevent bleeding after the polypectomy, one       hemostatic clip was successfully placed (MR conditional). There was no       bleeding during, or at the end, of the procedure.      There was a small lipoma, 16 mm in diameter, in the sigmoid colon.       Biopsies were taken with a cold forceps for histology.      Multiple small and large-mouthed diverticula were found in the sigmoid       colon. There was no evidence of diverticular bleeding.      Localized moderate inflammation characterized by congestion (edema) and       erythema was found in the mid sigmoid colon. Biopsies were taken with a       cold forceps for histology.      The exam was otherwise without abnormality. Impression:            - Non-bleeding internal hemorrhoids.                        - One 15 mm polyp in the cecum, removed with a hot                         snare and SKIP. Incomplete resection. Resected tissue                         retrieved. Clip (MR conditional) was placed.                        - One 5 mm polyp in the cecum, removed with a jumbo                         cold forceps. Resected and retrieved.                        - One 11 mm polyp in the ascending colon, removed with                         a hot snare. Resected and retrieved. Clip (MR                         conditional) was placed.                        - Small lipoma in the sigmoid colon. Biopsied.                        -  Severe diverticulosis in the sigmoid colon. There                         was no evidence of diverticular bleeding.                        - Localized moderate inflammation was found in the mid                         sigmoid colon [Colitis (Autoimpression)]. Biopsied.                        - The examination was otherwise normal. Recommendation:         - Patient has a contact number available for                         emergencies. The signs and symptoms of potential                         delayed complications were discussed with the patient.                         Return to normal activities tomorrow. Written                         discharge instructions were provided to the patient.                        - Low fiber diet for 5 days.                        - Cipro (ciprofloxacin) 500 mg PO BID for 10 days.                        - Flagyl (metronidazole) 500 mg PO TID for 10 days.                        - Return to nurse practitioner in 1 week.                        - Follow up with Stephens November, NP in [ ]  months.                        - If polyps are benign or adenomatous without                         dysplasia, I will advise NO further colonoscopy due to                         advanced age and/or severe comorbidity.                        - The findings and recommendations were discussed with                         the patient. Procedure Code(s):     --- Professional ---  45385, Colonoscopy, flexible; with removal of                         tumor(s), polyp(s), or other lesion(s) by snare                         technique                        45381, Colonoscopy, flexible; with directed submucosal                         injection(s), any substance                        44010, 28, Colonoscopy, flexible; with biopsy, single                         or multiple Diagnosis Code(s):     --- Professional ---                        K57.30, Diverticulosis of large intestine without                         perforation or abscess without bleeding                        K64.0, First degree hemorrhoids                        D17.5, Benign lipomatous neoplasm of intra-abdominal                         organs                        K63.5, Polyp of colon                        Z86.010, Personal history of  colonic polyps CPT copyright 2019 American Medical Association. All rights reserved. The codes documented in this report are preliminary and upon coder review may  be revised to meet current compliance requirements. Efrain Sella MD, MD 05/02/2019 9:20:54 AM This report has been signed electronically. Number of Addenda: 0 Note Initiated On: 05/02/2019 8:28 AM Scope Withdrawal Time: 0 hours 22 minutes 1 second  Total Procedure Duration: 0 hours 35 minutes 17 seconds  Estimated Blood Loss:  Estimated blood loss was minimal.      [facility]

## 2019-05-03 ENCOUNTER — Encounter: Payer: Self-pay | Admitting: *Deleted

## 2019-05-03 LAB — SURGICAL PATHOLOGY

## 2019-05-08 ENCOUNTER — Ambulatory Visit: Payer: Medicare HMO | Attending: Internal Medicine

## 2019-05-08 DIAGNOSIS — Z23 Encounter for immunization: Secondary | ICD-10-CM

## 2019-05-08 NOTE — Progress Notes (Signed)
   Covid-19 Vaccination Clinic  Name:  Annette Quinn    MRN: [id number] DOB: 12-21-1950  05/08/2019  [name] was observed post Covid-19 immunization for 15 minutes without incident. She was provided with Vaccine Information Sheet and instruction to access the V-Safe system.   [name] was instructed to call 911 with any severe reactions post vaccine: Marland Kitchen Difficulty breathing  . Swelling of face and throat  . A fast heartbeat  . A bad rash all over body  . Dizziness and weakness   Immunizations Administered    Name Date Dose VIS Date Route   Pfizer COVID-19 Vaccine 05/08/2019  9:06 AM 0.3 mL 01/19/2019 Intramuscular   Manufacturer: [place]   Lot: [id number]   [place]: [address]-4

## 2019-05-10 DIAGNOSIS — D369 Benign neoplasm, unspecified site: Secondary | ICD-10-CM | POA: Diagnosis not present

## 2019-05-10 DIAGNOSIS — K580 Irritable bowel syndrome with diarrhea: Secondary | ICD-10-CM | POA: Diagnosis not present

## 2019-05-24 DIAGNOSIS — M62830 Muscle spasm of back: Secondary | ICD-10-CM | POA: Diagnosis not present

## 2019-05-24 DIAGNOSIS — M47816 Spondylosis without myelopathy or radiculopathy, lumbar region: Secondary | ICD-10-CM | POA: Diagnosis not present

## 2019-05-24 DIAGNOSIS — M5136 Other intervertebral disc degeneration, lumbar region: Secondary | ICD-10-CM | POA: Diagnosis not present

## 2019-06-21 DIAGNOSIS — L570 Actinic keratosis: Secondary | ICD-10-CM | POA: Diagnosis not present

## 2019-06-21 DIAGNOSIS — H00019 Hordeolum externum unspecified eye, unspecified eyelid: Secondary | ICD-10-CM | POA: Diagnosis not present

## 2019-06-21 DIAGNOSIS — Z85828 Personal history of other malignant neoplasm of skin: Secondary | ICD-10-CM | POA: Diagnosis not present

## 2019-06-21 DIAGNOSIS — D0439 Carcinoma in situ of skin of other parts of face: Secondary | ICD-10-CM | POA: Diagnosis not present

## 2019-06-21 DIAGNOSIS — L853 Xerosis cutis: Secondary | ICD-10-CM | POA: Diagnosis not present

## 2019-06-21 DIAGNOSIS — L2489 Irritant contact dermatitis due to other agents: Secondary | ICD-10-CM | POA: Diagnosis not present

## 2019-07-05 ENCOUNTER — Emergency Department: Payer: Medicare HMO

## 2019-07-05 ENCOUNTER — Inpatient Hospital Stay
Admission: EM | Admit: 2019-07-05 | Discharge: 2019-07-09 | DRG: 562 | Disposition: A | Discharge status: Home Health | Payer: Medicare HMO | Attending: Internal Medicine | Admitting: Internal Medicine

## 2019-07-05 ENCOUNTER — Other Ambulatory Visit: Payer: Self-pay

## 2019-07-05 DIAGNOSIS — Z961 Presence of intraocular lens: Secondary | ICD-10-CM | POA: Diagnosis present

## 2019-07-05 DIAGNOSIS — K219 Gastro-esophageal reflux disease without esophagitis: Secondary | ICD-10-CM | POA: Diagnosis present

## 2019-07-05 DIAGNOSIS — E7800 Pure hypercholesterolemia, unspecified: Secondary | ICD-10-CM | POA: Diagnosis present

## 2019-07-05 DIAGNOSIS — S0181XA Laceration without foreign body of other part of head, initial encounter: Secondary | ICD-10-CM | POA: Diagnosis not present

## 2019-07-05 DIAGNOSIS — T148XXA Other injury of unspecified body region, initial encounter: Secondary | ICD-10-CM

## 2019-07-05 DIAGNOSIS — J449 Chronic obstructive pulmonary disease, unspecified: Secondary | ICD-10-CM | POA: Diagnosis present

## 2019-07-05 DIAGNOSIS — S52502D Unspecified fracture of the lower end of left radius, subsequent encounter for closed fracture with routine healing: Secondary | ICD-10-CM | POA: Diagnosis not present

## 2019-07-05 DIAGNOSIS — Z9842 Cataract extraction status, left eye: Secondary | ICD-10-CM

## 2019-07-05 DIAGNOSIS — Z79899 Other long term (current) drug therapy: Secondary | ICD-10-CM

## 2019-07-05 DIAGNOSIS — Z96612 Presence of left artificial shoulder joint: Secondary | ICD-10-CM | POA: Diagnosis present

## 2019-07-05 DIAGNOSIS — Z20822 Contact with and (suspected) exposure to covid-19: Secondary | ICD-10-CM | POA: Diagnosis present

## 2019-07-05 DIAGNOSIS — Z87891 Personal history of nicotine dependence: Secondary | ICD-10-CM

## 2019-07-05 DIAGNOSIS — Z85820 Personal history of malignant melanoma of skin: Secondary | ICD-10-CM | POA: Diagnosis not present

## 2019-07-05 DIAGNOSIS — S52692A Other fracture of lower end of left ulna, initial encounter for closed fracture: Secondary | ICD-10-CM | POA: Diagnosis not present

## 2019-07-05 DIAGNOSIS — G250 Essential tremor: Secondary | ICD-10-CM | POA: Diagnosis present

## 2019-07-05 DIAGNOSIS — R52 Pain, unspecified: Secondary | ICD-10-CM | POA: Diagnosis not present

## 2019-07-05 DIAGNOSIS — Z9181 History of falling: Secondary | ICD-10-CM

## 2019-07-05 DIAGNOSIS — M79603 Pain in arm, unspecified: Secondary | ICD-10-CM | POA: Diagnosis not present

## 2019-07-05 DIAGNOSIS — J9601 Acute respiratory failure with hypoxia: Secondary | ICD-10-CM

## 2019-07-05 DIAGNOSIS — F329 Major depressive disorder, single episode, unspecified: Secondary | ICD-10-CM | POA: Diagnosis present

## 2019-07-05 DIAGNOSIS — R42 Dizziness and giddiness: Secondary | ICD-10-CM | POA: Diagnosis not present

## 2019-07-05 DIAGNOSIS — M810 Age-related osteoporosis without current pathological fracture: Secondary | ICD-10-CM | POA: Diagnosis present

## 2019-07-05 DIAGNOSIS — S52502A Unspecified fracture of the lower end of left radius, initial encounter for closed fracture: Secondary | ICD-10-CM

## 2019-07-05 DIAGNOSIS — Z23 Encounter for immunization: Secondary | ICD-10-CM

## 2019-07-05 DIAGNOSIS — Z888 Allergy status to other drugs, medicaments and biological substances status: Secondary | ICD-10-CM

## 2019-07-05 DIAGNOSIS — G629 Polyneuropathy, unspecified: Secondary | ICD-10-CM | POA: Diagnosis present

## 2019-07-05 DIAGNOSIS — G4733 Obstructive sleep apnea (adult) (pediatric): Secondary | ICD-10-CM | POA: Diagnosis present

## 2019-07-05 DIAGNOSIS — Z6839 Body mass index (BMI) 39.0-39.9, adult: Secondary | ICD-10-CM

## 2019-07-05 DIAGNOSIS — Z981 Arthrodesis status: Secondary | ICD-10-CM

## 2019-07-05 DIAGNOSIS — R0902 Hypoxemia: Secondary | ICD-10-CM | POA: Diagnosis not present

## 2019-07-05 DIAGNOSIS — S42402A Unspecified fracture of lower end of left humerus, initial encounter for closed fracture: Secondary | ICD-10-CM

## 2019-07-05 DIAGNOSIS — I959 Hypotension, unspecified: Secondary | ICD-10-CM | POA: Diagnosis not present

## 2019-07-05 DIAGNOSIS — W109XXA Fall (on) (from) unspecified stairs and steps, initial encounter: Secondary | ICD-10-CM | POA: Diagnosis present

## 2019-07-05 DIAGNOSIS — Z86718 Personal history of other venous thrombosis and embolism: Secondary | ICD-10-CM

## 2019-07-05 DIAGNOSIS — J9621 Acute and chronic respiratory failure with hypoxia: Secondary | ICD-10-CM | POA: Diagnosis present

## 2019-07-05 DIAGNOSIS — G5710 Meralgia paresthetica, unspecified lower limb: Secondary | ICD-10-CM | POA: Diagnosis present

## 2019-07-05 DIAGNOSIS — S42412A Displaced simple supracondylar fracture without intercondylar fracture of left humerus, initial encounter for closed fracture: Principal | ICD-10-CM | POA: Diagnosis present

## 2019-07-05 DIAGNOSIS — S52572A Other intraarticular fracture of lower end of left radius, initial encounter for closed fracture: Secondary | ICD-10-CM | POA: Diagnosis not present

## 2019-07-05 DIAGNOSIS — Z7901 Long term (current) use of anticoagulants: Secondary | ICD-10-CM

## 2019-07-05 DIAGNOSIS — S0990XA Unspecified injury of head, initial encounter: Secondary | ICD-10-CM

## 2019-07-05 DIAGNOSIS — W19XXXD Unspecified fall, subsequent encounter: Secondary | ICD-10-CM | POA: Diagnosis not present

## 2019-07-05 DIAGNOSIS — S42492A Other displaced fracture of lower end of left humerus, initial encounter for closed fracture: Secondary | ICD-10-CM

## 2019-07-05 DIAGNOSIS — W108XXA Fall (on) (from) other stairs and steps, initial encounter: Secondary | ICD-10-CM

## 2019-07-05 DIAGNOSIS — S199XXA Unspecified injury of neck, initial encounter: Secondary | ICD-10-CM | POA: Diagnosis not present

## 2019-07-05 DIAGNOSIS — Z9841 Cataract extraction status, right eye: Secondary | ICD-10-CM | POA: Diagnosis not present

## 2019-07-05 DIAGNOSIS — S42409A Unspecified fracture of lower end of unspecified humerus, initial encounter for closed fracture: Secondary | ICD-10-CM | POA: Diagnosis present

## 2019-07-05 DIAGNOSIS — R58 Hemorrhage, not elsewhere classified: Secondary | ICD-10-CM | POA: Diagnosis not present

## 2019-07-05 DIAGNOSIS — R339 Retention of urine, unspecified: Secondary | ICD-10-CM | POA: Diagnosis present

## 2019-07-05 DIAGNOSIS — Z90710 Acquired absence of both cervix and uterus: Secondary | ICD-10-CM | POA: Diagnosis not present

## 2019-07-05 DIAGNOSIS — S62102A Fracture of unspecified carpal bone, left wrist, initial encounter for closed fracture: Secondary | ICD-10-CM

## 2019-07-05 DIAGNOSIS — Z881 Allergy status to other antibiotic agents status: Secondary | ICD-10-CM

## 2019-07-05 DIAGNOSIS — S0101XA Laceration without foreign body of scalp, initial encounter: Secondary | ICD-10-CM | POA: Diagnosis present

## 2019-07-05 DIAGNOSIS — Z880 Allergy status to penicillin: Secondary | ICD-10-CM

## 2019-07-05 DIAGNOSIS — S299XXA Unspecified injury of thorax, initial encounter: Secondary | ICD-10-CM | POA: Diagnosis not present

## 2019-07-05 DIAGNOSIS — S42492D Other displaced fracture of lower end of left humerus, subsequent encounter for fracture with routine healing: Secondary | ICD-10-CM | POA: Diagnosis not present

## 2019-07-05 DIAGNOSIS — W19XXXA Unspecified fall, initial encounter: Secondary | ICD-10-CM

## 2019-07-05 LAB — COMPREHENSIVE METABOLIC PANEL
ALT: 16 U/L (ref 0–44)
AST: 21 U/L (ref 15–41)
Albumin: 3.5 g/dL (ref 3.5–5.0)
Alkaline Phosphatase: 28 U/L — ABNORMAL LOW (ref 38–126)
Anion gap: 9 (ref 5–15)
BUN: 14 mg/dL (ref 8–23)
CO2: 30 mmol/L (ref 22–32)
Calcium: 8.6 mg/dL — ABNORMAL LOW (ref 8.9–10.3)
Chloride: 101 mmol/L (ref 98–111)
Creatinine, Ser: 0.84 mg/dL (ref 0.44–1.00)
GFR calc Af Amer: >60 mL/min (ref >60)
GFR calc non Af Amer: >60 mL/min (ref >60)
Glucose, Bld: 113 mg/dL — ABNORMAL HIGH (ref 70–99)
Potassium: 3.4 mmol/L — ABNORMAL LOW (ref 3.5–5.1)
Sodium: 140 mmol/L (ref 135–145)
Total Bilirubin: 1 mg/dL (ref 0.3–1.2)
Total Protein: 6.3 g/dL — ABNORMAL LOW (ref 6.5–8.1)

## 2019-07-05 LAB — BLOOD GAS, VENOUS
Acid-base deficit: 13.4 mmol/L — ABNORMAL HIGH (ref 0.0–2.0)
Bicarbonate: 11.8 mmol/L — ABNORMAL LOW (ref 20.0–28.0)
FIO2: 60
O2 Saturation: 86.4 %
Patient temperature: 37
pCO2, Ven: 24 mmHg — ABNORMAL LOW (ref 44.0–60.0)
pH, Ven: 7.3 (ref 7.250–7.430)
pO2, Ven: 58 mmHg — ABNORMAL HIGH (ref 32.0–45.0)

## 2019-07-05 LAB — PROTIME-INR
INR: 1 (ref 0.8–1.2)
Prothrombin Time: 13.1 seconds (ref 11.4–15.2)

## 2019-07-05 LAB — CBC
HCT: 37.3 % (ref 36.0–46.0)
Hemoglobin: 12.5 g/dL (ref 12.0–15.0)
MCH: 29.2 pg (ref 26.0–34.0)
MCHC: 33.5 g/dL (ref 30.0–36.0)
MCV: 87.1 fL (ref 80.0–100.0)
Platelets: 210 10*3/uL (ref 150–400)
RBC: 4.28 MIL/uL (ref 3.87–5.11)
RDW: 13.2 % (ref 11.5–15.5)
WBC: 8.4 10*3/uL (ref 4.0–10.5)
nRBC: 0 % (ref 0.0–0.2)

## 2019-07-05 LAB — APTT: aPTT: 26 seconds (ref 24–36)

## 2019-07-05 LAB — SARS CORONAVIRUS 2 BY RT PCR (HOSPITAL ORDER, PERFORMED IN ~~LOC~~ HOSPITAL LAB): SARS Coronavirus 2: NEGATIVE

## 2019-07-05 MED ORDER — IPRATROPIUM-ALBUTEROL 0.5-2.5 (3) MG/3ML IN SOLN
3.0000 mL | Freq: Once | RESPIRATORY_TRACT | Status: AC
  Administered 2019-07-05: 3 mL via RESPIRATORY_TRACT
  Filled 2019-07-05: qty 3

## 2019-07-05 MED ORDER — ACETAMINOPHEN 650 MG RE SUPP: 650.0000 mg | Freq: Four times a day (QID) | RECTAL | Status: DC | PRN

## 2019-07-05 MED ORDER — HYDROMORPHONE HCL 1 MG/ML IJ SOLN
0.5000 mg | Freq: Once | INTRAMUSCULAR | Status: AC
  Administered 2019-07-05: 0.5 mg via INTRAVENOUS

## 2019-07-05 MED ORDER — ALBUTEROL SULFATE (2.5 MG/3ML) 0.083% IN NEBU: 2.5000 mg | INHALATION_SOLUTION | RESPIRATORY_TRACT | Status: DC | PRN

## 2019-07-05 MED ORDER — ENOXAPARIN SODIUM 40 MG/0.4ML ~~LOC~~ SOLN
40.0000 mg | SUBCUTANEOUS | Status: DC
  Administered 2019-07-05 – 2019-07-08 (×4): 40 mg via SUBCUTANEOUS
  Filled 2019-07-05 (×4): qty 0.4

## 2019-07-05 MED ORDER — HYDROMORPHONE HCL 1 MG/ML IJ SOLN
1.0000 mg | Freq: Once | INTRAMUSCULAR | Status: AC
  Administered 2019-07-05: 1 mg via INTRAVENOUS

## 2019-07-05 MED ORDER — HYDROMORPHONE HCL 1 MG/ML IJ SOLN
INTRAMUSCULAR | Status: AC
  Filled 2019-07-05: qty 1

## 2019-07-05 MED ORDER — GABAPENTIN 300 MG PO CAPS
600.0000 mg | ORAL_CAPSULE | Freq: Three times a day (TID) | ORAL | Status: DC
  Administered 2019-07-05 – 2019-07-09 (×11): 600 mg via ORAL
  Filled 2019-07-05 (×11): qty 2

## 2019-07-05 MED ORDER — ALBUTEROL SULFATE HFA 108 (90 BASE) MCG/ACT IN AERS: 2.0000 | INHALATION_SPRAY | Freq: Four times a day (QID) | RESPIRATORY_TRACT | Status: DC | PRN

## 2019-07-05 MED ORDER — HYDROCODONE-ACETAMINOPHEN 5-325 MG PO TABS
1.0000 | ORAL_TABLET | ORAL | Status: DC | PRN
  Administered 2019-07-06 (×2): 1 via ORAL
  Administered 2019-07-07 – 2019-07-08 (×6): 2 via ORAL
  Filled 2019-07-05 (×2): qty 1
  Filled 2019-07-05 (×7): qty 2

## 2019-07-05 MED ORDER — TETANUS-DIPHTH-ACELL PERTUSSIS 5-2.5-18.5 LF-MCG/0.5 IM SUSP
0.5000 mL | Freq: Once | INTRAMUSCULAR | Status: AC
  Administered 2019-07-05: 0.5 mL via INTRAMUSCULAR

## 2019-07-05 MED ORDER — ONDANSETRON HCL 4 MG PO TABS: 4.0000 mg | ORAL_TABLET | Freq: Four times a day (QID) | ORAL | Status: DC | PRN

## 2019-07-05 MED ORDER — LIDOCAINE-EPINEPHRINE 2 %-1:100000 IJ SOLN
INTRAMUSCULAR | Status: AC
  Filled 2019-07-05: qty 1

## 2019-07-05 MED ORDER — ACETAMINOPHEN 325 MG PO TABS: 650.0000 mg | ORAL_TABLET | Freq: Four times a day (QID) | ORAL | Status: DC | PRN

## 2019-07-05 MED ORDER — MORPHINE SULFATE (PF) 2 MG/ML IV SOLN
2.0000 mg | INTRAVENOUS | Status: DC | PRN
  Administered 2019-07-06 – 2019-07-07 (×7): 2 mg via INTRAVENOUS
  Filled 2019-07-05 (×7): qty 1

## 2019-07-05 MED ORDER — LIDOCAINE-EPINEPHRINE 2 %-1:100000 IJ SOLN
20.0000 mL | Freq: Once | INTRAMUSCULAR | Status: AC
  Administered 2019-07-05: 20 mL via INTRADERMAL
  Filled 2019-07-05: qty 1

## 2019-07-05 MED ORDER — SENNOSIDES-DOCUSATE SODIUM 8.6-50 MG PO TABS: 1.0000 | ORAL_TABLET | Freq: Every evening | ORAL | Status: DC | PRN

## 2019-07-05 MED ORDER — ACETAMINOPHEN 500 MG PO TABS
1000.0000 mg | ORAL_TABLET | Freq: Once | ORAL | Status: AC
  Administered 2019-07-05: 1000 mg via ORAL
  Filled 2019-07-05: qty 2

## 2019-07-05 MED ORDER — ONDANSETRON HCL 4 MG/2ML IJ SOLN: 4.0000 mg | Freq: Four times a day (QID) | INTRAMUSCULAR | Status: DC | PRN

## 2019-07-05 MED ORDER — ONDANSETRON HCL 4 MG/2ML IJ SOLN
4.0000 mg | Freq: Once | INTRAMUSCULAR | Status: AC
  Administered 2019-07-05: 4 mg via INTRAVENOUS

## 2019-07-05 MED ORDER — HYDROMORPHONE HCL 1 MG/ML IJ SOLN
1.0000 mg | Freq: Once | INTRAMUSCULAR | Status: AC
  Administered 2019-07-05: 1 mg via INTRAVENOUS
  Filled 2019-07-05: qty 1

## 2019-07-05 MED ORDER — CEFAZOLIN SODIUM-DEXTROSE 2-4 GM/100ML-% IV SOLN
2.0000 g | Freq: Once | INTRAVENOUS | Status: AC
  Administered 2019-07-05: 2 g via INTRAVENOUS
  Filled 2019-07-05: qty 100

## 2019-07-05 NOTE — ED Provider Notes (Signed)
[facility] Emergency Department Provider Note  ____________________________________________   First MD Initiated Contact with Patient 07/05/19 1404     (approximate)  I have reviewed the triage vital signs and the nursing notes.   HISTORY  Chief Complaint Fall  HPI Annette Quinn is a 69 y.o. female with depression, prior DVT not on anticoagulation comes in for fall.  Patient states that she was leaning forward and lost her balance and fell and hit her head.  She denies LOC but does have a laceration to the left side of her head.  She denies being on any blood thinner.  She reports having pain in her left arm that is constant, severe, worse with movement, better at rest.            Past Medical History:  Diagnosis Date  . Adopted   . Arthritis    osteoarthritis  . Blood clot in vein 1975  . Cancer ([place])   . Complication of anesthesia    body aches after anesthesia  . Depression   . Diverticulosis   . GERD (gastroesophageal reflux disease)   . Hepatitis    as a child  . History of bronchitis   . Hypercholesterolemia   . Motion sickness    curvy/hilly roads  . Neuropathy    upper legs  . Osteoporosis   . Skin cancer (melanoma) ([place])    left upper thigh  . Skin cancer of face    basal cell  . Sleep apnea    CPAP  . Tremor   . Wears dentures    full upper and lower    Patient Active Problem List   Diagnosis Date Noted  . Status post reverse total shoulder replacement, left 01/20/2017    Past Surgical History:  Procedure Laterality Date  . ABDOMINAL HYSTERECTOMY  1979  . BLADDER SUSPENSION  1992  . BREAST BIOPSY Left 2010   benign  . CATARACT EXTRACTION W/PHACO Right 12/08/2016   Procedure: CATARACT EXTRACTION PHACO AND INTRAOCULAR LENS PLACEMENT (IOC)-RIGHT;  Surgeon: Birder Robson, MD;  Location: [facility];  Service: Ophthalmology;  Laterality: Right;  [place] 00:56.6 AP% 11.5 CDE 6.52 Fluid Pack lot # [id number]  . CATARACT  EXTRACTION W/PHACO Left 01/04/2017   Procedure: CATARACT EXTRACTION PHACO AND INTRAOCULAR LENS PLACEMENT (IOC);  Surgeon: Birder Robson, MD;  Location: [facility];  Service: Ophthalmology;  Laterality: Left;  [place] 00:33.9 AP% 14.5 CDE 4.91 Fluid Pack lot # [id number]  . COLONOSCOPY  2010, 2015  . COLONOSCOPY WITH PROPOFOL N/A 05/02/2019   Procedure: COLONOSCOPY WITH PROPOFOL;  Surgeon: Toledo, Benay Pike, MD;  Location: [facility];  Service: Gastroenterology;  Laterality: N/A;  . DILATION AND CURETTAGE OF UTERUS  1979  . DISTAL INTERPHALANGEAL JOINT FUSION Left 08/26/2016   Procedure: DISTAL INTERPHALANGEAL JOINT FUSION-LEFT INDEX FINGER;  Surgeon: Hessie Knows, MD;  Location: [facility];  Service: Orthopedics;  Laterality: Left;  . HAMMER TOE SURGERY Left 1989  . HAMMER TOE SURGERY Right 03/31/2016   Procedure: HAMMER TOE CORRECTION RIGHT T6 T7;  Surgeon: Samara Deist, DPM;  Location: [place];  Service: Podiatry;  Laterality: Right;  sleep apnea  . KNEE ARTHROSCOPY W/ PARTIAL MEDIAL MENISCECTOMY Right 03/05/2014   medial and lateral.  Abrasion hondroplasty of medial femoral condyle and femorla trochlea.  Marland Kitchen NASAL SEPTUM SURGERY    . REVERSE SHOULDER ARTHROPLASTY Left 01/20/2017   Procedure: REVERSE SHOULDER ARTHROPLASTY;  Surgeon: Corky Mull, MD;  Location: [facility];  Service: Orthopedics;  Laterality: Left;  . TONSILLECTOMY  1973  . TRIGGER FINGER RELEASE Left    thumb  . VEIN LIGATION    . WEIL OSTEOTOMY Right 03/31/2016   Procedure: WEIL OSTEOTOMY X2 RIGHT FOOT;  Surgeon: Samara Deist, DPM;  Location: [place];  Service: Podiatry;  Laterality: Right;  IV WITH POPLITEAL    Prior to Admission medications   Medication Sig Start Date End Date Taking? Authorizing Provider  acetaminophen (TYLENOL) 650 MG CR tablet Take 650-1,300 mg by mouth every 8 (eight) hours as needed for pain.     [provider]  albuterol (PROVENTIL HFA;VENTOLIN HFA) 108 (90 Base)  MCG/ACT inhaler Inhale 2 puffs into the lungs every 6 (six) hours as needed for wheezing or shortness of breath.     [provider]  Azelastine-Fluticasone 137-50 MCG/ACT SUSP Place 1 spray into the nose 2 (two) times daily as needed (for allergies.).     [provider]  Betamethasone Valerate 0.12 % foam Apply 1 application topically as needed (eczema).  11/01/16   [provider]  bumetanide (BUMEX) 1 MG tablet Take 1 mg by mouth 2 (two) times daily.     [provider]  busPIRone (BUSPAR) 7.5 MG tablet Take 7.5 mg by mouth 2 (two) times daily.    [provider]  CALCIUM PO Take 1 tablet by mouth daily.    [provider]  Cholecalciferol 2000 units CAPS Take 2,000 Units by mouth daily before breakfast.     [provider]  diclofenac sodium (VOLTAREN) 1 % GEL Apply 2-4 g topically 4 (four) times daily as needed (for pain.).     [provider]  enoxaparin (LOVENOX) 40 MG/0.4ML injection Inject 0.4 mLs (40 mg total) into the skin daily. 01/21/17   Lattie [place], PA-C  EPINEPHrine (AUVI-Q) 0.3 mg/0.3 mL IJ SOAJ injection Inject 0.3 mg into the muscle daily as needed (for anaphylatic allergic reaction).    [provider]  fenofibrate 160 MG tablet Take 160 mg by mouth daily at 12 noon.  08/01/16   [provider]  Fluocinolone Acetonide 0.01 % OIL Apply 1 application topically as needed (eczema).  11/01/16   [provider]  gabapentin (NEURONTIN) 300 MG capsule Take 600 mg by mouth 3 (three) times daily.  07/26/16   [provider]  GLUCOSAMINE-CHONDROITIN PO Take 1 tablet by mouth daily.    [provider]  hydrocortisone 2.5 % cream Apply 1 application topically 2 (two) times daily as needed ((typically uses once daily)). Mix equal parts with ketoconazole 2% applied affected area(s) on face    [provider]  ketoconazole (NIZORAL) 2 % cream Apply 1 application  topically 2 (two) times daily as needed for irritation ((typically uses once daily)). Mix equal parts with hydrocortisone 2.5% applied affected area(s) on face    [provider]  montelukast (SINGULAIR) 10 MG tablet Take 10 mg by mouth every morning.     [provider]  oxyCODONE (OXY IR/ROXICODONE) 5 MG immediate release tablet Take 1-2 tablets (5-10 mg total) by mouth every 4 (four) hours as needed for severe pain. 01/21/17   Lattie [place], PA-C  pantoprazole (PROTONIX) 40 MG tablet Take 40 mg by mouth See admin instructions. Take 1 tablet (40 mg) scheduled every morning; may take additional dose later if needed    [provider]  propranolol (INDERAL) 20 MG tablet Take 20 mg by mouth 2 (two) times daily.    [provider]  raloxifene (EVISTA) 60 MG tablet Take 60 mg by mouth daily at 12 noon.     [provider]  sertraline (ZOLOFT) 100 MG tablet Take 200 mg by mouth daily with lunch.     [provider]  traMADol (ULTRAM) 50 MG tablet Take 1-2 tablets (50-100 mg total) by mouth every 6 (six) hours as needed. 01/21/17   Lattie [place], PA-C    Allergies Actonel [risedronate sodium], Boniva [ibandronic acid], Fosamax [alendronate], Lopid [gemfibrozil], Penicillins, Reclast [zoledronic acid], Strawberry (diagnostic), Tape, and Doxycycline  Family History  Adopted: Yes    Social History Social History   Tobacco Use  . Smoking status: Former Smoker    Years: 43.00    Quit date: 07/20/2010    Years since quitting: 8.9  . Smokeless tobacco: Never Used  Substance Use Topics  . Alcohol use: No  . Drug use: No      Review of Systems Constitutional: No fever/chills Eyes: No visual changes. ENT: No sore throat. Cardiovascular: Denies chest pain. Respiratory: Denies shortness of breath. Gastrointestinal: No abdominal pain.  No nausea, no vomiting.  No diarrhea.  No constipation. Genitourinary: Negative for  dysuria. Musculoskeletal: Negative for back pain.  Pain in left arm Skin: Negative for rash. Neurological: Positive for headache, no focal weakness or numbness. All other ROS negative ____________________________________________   PHYSICAL EXAM:  VITAL SIGNS: There were no vitals taken for this visit.  Constitutional: Alert and oriented. GCS 15  Eyes: Conjunctivae are normal. EOMI. Head: Atraumatic.  Laceration of the left side of the head Nose: No congestion/rhinnorhea. Mouth/Throat: Mucous membranes are moist.   Neck: No stridor. Trachea Midline. FROM.  No C-spine tenderness Cardiovascular: Normal rate, regular rhythm. Grossly normal heart sounds.  Good peripheral circulation. No chest wall tenderness Respiratory: Normal respiratory effort.  No retractions. Lungs CTAB. Gastrointestinal: Soft and nontender. No distention. No abdominal bruits.  Musculoskeletal:   RUE: No point tenderness, deformity or other signs of injury. Radial pulse intact. Neuro intact. Full ROM in joint. LUE: Tenderness noted to the left arm from the shoulder down to the forearm.  Radial pulse intact. Neuro intact.  RLE: No point tenderness, deformity or other signs of injury. DP pulse intact. Neuro intact. Full ROM in joints. LLE: No point tenderness, deformity or other signs of injury. DP pulse intact. Neuro intact. Full ROM in joints. Neurologic:  Normal speech and language. No gross focal neurologic deficits are appreciated.  Skin:  Skin is warm, dry and intact. No rash noted. Psychiatric: Mood and affect are normal. Speech and behavior are normal. GU: Deferred   ____________________________________________   LABS (all labs ordered are listed, but only abnormal results are displayed)  Labs Reviewed  COMPREHENSIVE METABOLIC PANEL - Abnormal; Notable for the following components:      Result Value   Potassium 3.4 (*)    Glucose, Bld 113 (*)    Calcium 8.6 (*)    Total Protein 6.3 (*)    Alkaline  Phosphatase 28 (*)    All other components within normal limits  SARS CORONAVIRUS 2 BY RT PCR (HOSPITAL ORDER, [facility])  CBC  PROTIME-INR  APTT  URINALYSIS, COMPLETE (UACMP) WITH MICROSCOPIC   ____________________________________________   RADIOLOGY Robert Bellow, personally viewed and evaluated these images (plain radiographs) as part of my medical decision making, as well as reviewing the written report by the radiologist.  ED MD interpretation:  pending  Official radiology report(s): No results  found.  ____________________________________________   PROCEDURES  Procedure(s) performed (including Critical Care):  Marland KitchenMarland KitchenLaceration Repair  Date/Time: 07/05/2019 4:44 PM Performed by: Vanessa Henderson, MD Authorized by: Vanessa [place], MD   Consent:    Consent obtained:  Verbal   Consent given by:  Patient   Risks discussed:  Infection, need for additional repair, nerve damage, poor wound healing, poor cosmetic result, pain, retained foreign body, tendon damage and vascular damage   Alternatives discussed:  No treatment Anesthesia (see MAR for exact dosages):    Anesthesia method:  Local infiltration   Local anesthetic:  Lidocaine 1% WITH epi Laceration details:    Location:  Face   Face location:  Forehead   Length (cm):  8   Depth (mm):  3 Repair type:    Repair type:  Simple Pre-procedure details:    Preparation:  Patient was prepped and draped in usual sterile fashion Exploration:    Hemostasis achieved with:  Epinephrine   Wound exploration: entire depth of wound probed and visualized     Wound extent: no fascia violation noted and no foreign bodies/material noted     Contaminated: no   Treatment:    Area cleansed with:  Saline   Amount of cleaning:  Standard Skin repair:    Repair method:  Sutures and tissue adhesive   Suture size:  6-0   Suture material:  Prolene   Number of sutures:  10 Post-procedure details:    Patient  tolerance of procedure:  Tolerated well, no immediate complications Comments:     Small area of skin loss in middle of the laceration so a small amount of glue was applied between sutures were it was harder to approximate edges     ____________________________________________   INITIAL IMPRESSION / ASSESSMENT AND PLAN / ED COURSE    Kimmerly D Melott was evaluated in Emergency Department on 07/05/2019 for the symptoms described in the history of present illness. She was evaluated in the context of the global COVID-19 pandemic, which necessitated consideration that the patient might be at risk for infection with the SARS-CoV-2 virus that causes COVID-19. Institutional protocols and algorithms that pertain to the evaluation of patients at risk for COVID-19 are in a state of rapid change based on information released by regulatory bodies including the CDC and federal and state organizations. These policies and algorithms were followed during the patient's care in the ED.     To note this encounter occurred during downtime.  Patient presents with mechanical fall.  Will get CT head evaluate for intracranial hemorrhage, CT cervical evaluate for cervical fracture.  Patient lifting up both legs up off the bed low suspicion for pelvic fracture.  She has no chest wall tenderness or abdominal tenderness to suggest internal injuries.  She does have pain on the left arm.  Will get x-rays to evaluate for fractures.  Will update patient's tetanus and give some Dilaudid IV for pain medication. NOT syncope.   Labs re-assuring.  xrays with fractures  Pt handed off pending CT scans, d/w ortho, and final dispo       ____________________________________________   FINAL CLINICAL IMPRESSION(S) / ED DIAGNOSES   Final diagnoses:  None      MEDICATIONS GIVEN DURING THIS VISIT:  Medications - No data to display   ED Discharge Orders    None       Note:  This document was prepared using Dragon  voice recognition software and may include unintentional dictation errors.  Vanessa Oolitic, MD 07/05/19 [phone]

## 2019-07-05 NOTE — ED Triage Notes (Signed)
She arrives today via [facility] from home  Pt was reaching for a package when she experienced a fall down several steps  She arrives with laceration to the left side of her head  - bleeding controlled upon arrival and her left arm is in a sling pt report 8/10 pain to her arm  3/10 pain to her head

## 2019-07-05 NOTE — H&P (Signed)
History and Physical    Annette Quinn ZOX:[id number] DOB: [birth year] DOA: 07/05/2019  PCP: Kirk Ruths, MD   Patient coming from: Home  I have personally briefly reviewed patient's old medical records in [place]  Chief Complaint: Fall  HPI: Annette Quinn is a 69 y.o. female with medical history significant for peripheral neuropathy and meralgia paresthetica, essential tremor, followed by neurology with history of imbalance and history of a prior fall who presents to the emergency room today following a fall.  Patient said she bent over to pick up some [employer] boxes that were left on her stairs and she fell forward, going down 6 stairs. he fell onto the left side sustaining a laceration injury to the left upper extremity.  She did not lose consciousness.  She denied preceding lightheadedness, palpitations, visual disturbance or one-sided weakness numbness or tingling.  ED Course: On arrival in the ER she was mildly hypoxic with O2 sat 93% on room air.  Evaluation in the emergency room with trauma work-up revealed laceration to the left side of her head that was sutured, as well as displaced left distal humerus fracture and distal both bone forearm fracture.  She was evaluated by orthopedist, Dr. Mack Guise who has already made arrangements for her to follow-up with a hand specialist Dr. Peggye Ley next Tuesday in the office and to arrange for surgery.  In the emergency room she received doses of Dilaudid for pain relief.  She had inadequate pain relief and additionally her O2 sat dropped to 85% on room air requiring O2 up to 3 L to keep sats over 94%.  She had no prior history of oxygen use.  Hospitalist consulted for admission for pain control and monitoring of  Review of Systems: As per HPI otherwise 10 point review of systems negative.    Past Medical History:  Diagnosis Date   Adopted    Arthritis    osteoarthritis   Blood clot in vein 1975   Cancer ([place])    Complication of  anesthesia    body aches after anesthesia   Depression    Diverticulosis    GERD (gastroesophageal reflux disease)    Hepatitis    as a child   History of bronchitis    Hypercholesterolemia    Motion sickness    curvy/[place] roads   Neuropathy    upper legs   Osteoporosis    Skin cancer (melanoma) (HCC)    left upper thigh   Skin cancer of face    basal cell   Sleep apnea    CPAP   Tremor    Wears dentures    full upper and lower    Past Surgical History:  Procedure Laterality Date   ABDOMINAL HYSTERECTOMY  1979   BLADDER SUSPENSION  1992   BREAST BIOPSY Left 2010   benign   CATARACT EXTRACTION W/PHACO Right 12/08/2016   Procedure: CATARACT EXTRACTION PHACO AND INTRAOCULAR LENS PLACEMENT (IOC)-RIGHT;  Surgeon: Birder Robson, MD;  Location: [facility];  Service: Ophthalmology;  Laterality: Right;  [place] 00:56.6 AP% 11.5 CDE 6.52 Fluid Pack lot # [id number]   CATARACT EXTRACTION W/PHACO Left 01/04/2017   Procedure: CATARACT EXTRACTION PHACO AND INTRAOCULAR LENS PLACEMENT (IOC);  Surgeon: Birder Robson, MD;  Location: [facility];  Service: Ophthalmology;  Laterality: Left;  [place] 00:33.9 AP% 14.5 CDE 4.91 Fluid Pack lot # [id number]   COLONOSCOPY  2010, 2015   COLONOSCOPY WITH PROPOFOL N/A 05/02/2019   Procedure: COLONOSCOPY WITH PROPOFOL;  Surgeon:  Toledo, Benay Pike, MD;  Location: [facility];  Service: Gastroenterology;  Laterality: N/A;   DILATION AND CURETTAGE OF UTERUS  1979   DISTAL INTERPHALANGEAL JOINT FUSION Left 08/26/2016   Procedure: DISTAL INTERPHALANGEAL JOINT FUSION-LEFT INDEX FINGER;  Surgeon: Hessie Knows, MD;  Location: [facility];  Service: Orthopedics;  Laterality: Left;   HAMMER TOE SURGERY Left 1989   HAMMER TOE SURGERY Right 03/31/2016   Procedure: HAMMER TOE CORRECTION RIGHT T6 T7;  Surgeon: Samara Deist, DPM;  Location: [place];  Service: Podiatry;  Laterality: Right;  sleep apnea   KNEE ARTHROSCOPY W/ PARTIAL  MEDIAL MENISCECTOMY Right 03/05/2014   medial and lateral.  Abrasion hondroplasty of medial femoral condyle and femorla trochlea.   NASAL SEPTUM SURGERY     REVERSE SHOULDER ARTHROPLASTY Left 01/20/2017   Procedure: REVERSE SHOULDER ARTHROPLASTY;  Surgeon: Corky Mull, MD;  Location: [facility];  Service: Orthopedics;  Laterality: Left;   TONSILLECTOMY  1973   TRIGGER FINGER RELEASE Left    thumb   VEIN LIGATION     WEIL OSTEOTOMY Right 03/31/2016   Procedure: WEIL OSTEOTOMY X2 RIGHT FOOT;  Surgeon: Samara Deist, DPM;  Location: [place];  Service: Podiatry;  Laterality: Right;  IV WITH POPLITEAL     reports that she quit smoking about 8 years ago. She quit after 43.00 years of use. She has never used smokeless tobacco. She reports that she does not drink alcohol or use drugs.  Allergies  Allergen Reactions   Actonel [Risedronate Sodium]    Boniva [Ibandronic Acid]    Fosamax [Alendronate]     Severe muscle aches   Lopid [Gemfibrozil]     headache   Penicillins Other (See Comments)    (injectable only) - knot at site Has patient had a PCN reaction causing immediate rash, facial/tongue/throat swelling, SOB or lightheadedness with hypotension:NO Has patient had a PCN reaction causing severe rash involving mucus membranes or skin necrosis: No Has patient had a PCN reaction that required hospitalization: No Has patient had a PCN reaction occurring within the last 10 years: No If all of the above answers are "NO", then may proceed with Cephalosporin use.    Reclast [Zoledronic Acid]     Severe muscle aches   Strawberry (Diagnostic) Itching   Tape     Tears skin (tegaderm and paper tape OK)   Doxycycline Rash    Family History  Adopted: Yes     Prior to Admission medications   Medication Sig Start Date End Date Taking? Authorizing Provider  acetaminophen (TYLENOL) 650 MG CR tablet Take 650-1,300 mg by mouth every 8 (eight) hours as needed for pain.      [provider]  albuterol (PROVENTIL HFA;VENTOLIN HFA) 108 (90 Base) MCG/ACT inhaler Inhale 2 puffs into the lungs every 6 (six) hours as needed for wheezing or shortness of breath.     [provider]  Azelastine-Fluticasone 137-50 MCG/ACT SUSP Place 1 spray into the nose 2 (two) times daily as needed (for allergies.).     [provider]  Betamethasone Valerate 0.12 % foam Apply 1 application topically as needed (eczema).  11/01/16   [provider]  bumetanide (BUMEX) 1 MG tablet Take 1 mg by mouth 2 (two) times daily.     [provider]  busPIRone (BUSPAR) 7.5 MG tablet Take 7.5 mg by mouth 2 (two) times daily.    [provider]  CALCIUM PO Take 1 tablet by mouth daily.    [provider]  Cholecalciferol 2000 units CAPS Take 2,000 Units by mouth daily before breakfast.     [provider]  diclofenac sodium (VOLTAREN) 1 % GEL Apply 2-4 g topically 4 (four) times daily as needed (for pain.).     [provider]  enoxaparin (LOVENOX) 40 MG/0.4ML injection Inject 0.4 mLs (40 mg total) into the skin daily. 01/21/17   Lattie Corns, PA-C  EPINEPHrine (AUVI-Q) 0.3 mg/0.3 mL IJ SOAJ injection Inject 0.3 mg into the muscle daily as needed (for anaphylatic allergic reaction).    [provider]  fenofibrate 160 MG tablet Take 160 mg by mouth daily at 12 noon.  08/01/16   [provider]  Fluocinolone Acetonide 0.01 % OIL Apply 1 application topically as needed (eczema).  11/01/16   [provider]  gabapentin (NEURONTIN) 300 MG capsule Take 600 mg by mouth 3 (three) times daily.  07/26/16   [provider]  GLUCOSAMINE-CHONDROITIN PO Take 1 tablet by mouth daily.    [provider]  hydrocortisone 2.5 % cream Apply 1 application topically 2 (two) times daily as needed ((typically uses once daily)). Mix equal parts with ketoconazole 2% applied affected area(s) on face     [provider]  ketoconazole (NIZORAL) 2 % cream Apply 1 application topically 2 (two) times daily as needed for irritation ((typically uses once daily)). Mix equal parts with hydrocortisone 2.5% applied affected area(s) on face    [provider]  montelukast (SINGULAIR) 10 MG tablet Take 10 mg by mouth every morning.     [provider]  oxyCODONE (OXY IR/ROXICODONE) 5 MG immediate release tablet Take 1-2 tablets (5-10 mg total) by mouth every 4 (four) hours as needed for severe pain. 01/21/17   Lattie Corns, PA-C  pantoprazole (PROTONIX) 40 MG tablet Take 40 mg by mouth See admin instructions. Take 1 tablet (40 mg) scheduled every morning; may take additional dose later if needed    [provider]  propranolol (INDERAL) 20 MG tablet Take 20 mg by mouth 2 (two) times daily.    [provider]  raloxifene (EVISTA) 60 MG tablet Take 60 mg by mouth daily at 12 noon.     [provider]  sertraline (ZOLOFT) 100 MG tablet Take 200 mg by mouth daily with lunch.     [provider]  traMADol (ULTRAM) 50 MG tablet Take 1-2 tablets (50-100 mg total) by mouth every 6 (six) hours as needed. 01/21/17   Lattie Corns, PA-C    Physical Exam: Vitals:   07/05/19 1902 07/05/19 1922 07/05/19 1926 07/05/19 1928  BP:  133/76    Pulse: 73 74 69 67  Resp:      Temp:      TempSrc:      SpO2: 90% 96% 99% 100%  Weight:      Height:         Vitals:   07/05/19 1902 07/05/19 1922 07/05/19 1926 07/05/19 1928  BP:  133/76    Pulse: 73 74 69 67  Resp:      Temp:      TempSrc:      SpO2: 90% 96% 99% 100%  Weight:      Height:        Constitutional: Alert and awake, oriented x3, not in any acute distress. HEyes: 4 cm laceration left forehead, sutured.  PERLA, EOMI, irises appear normal, anicteric sclera,  ENMT: external ears and nose appear normal, normal hearing  Lips appears normal, oropharynx mucosa, tongue,  posterior pharynx appear normal  Neck: neck appears normal, no masses, normal ROM, no thyromegaly, no JVD  CVS: S1-S2 clear, no murmur rubs or gallops,  , no carotid bruits, pedal pulses palpable, No LE edema Respiratory:  clear to auscultation bilaterally, no wheezing, rales or rhonchi. Respiratory effort normal. No accessory muscle use.  Abdomen: soft nontender, nondistended, normal bowel sounds, no hepatosplenomegaly, no hernias Musculoskeletal: : no cyanosis, clubbing , no contractures or atrophy left arm in splint and sling Neuro: Cranial nerves II-XII intact, sensation, reflexes normal, strength Psych: judgement and insight appear normal, stable mood and affect,  Skin: no rashes.  5 cm laceration left forehead   Labs on Admission: I have personally reviewed following labs and imaging studies  CBC: Recent Labs  Lab 07/05/19 1406  WBC 8.4  HGB 12.5  HCT 37.3  MCV 87.1  PLT 253   Basic Metabolic Panel: Recent Labs  Lab 07/05/19 1406  NA 140  K 3.4*  CL 101  CO2 30  GLUCOSE 113*  BUN 14  CREATININE 0.84  CALCIUM 8.6*   GFR: Estimated Creatinine Clearance: 78.7 mL/min (by C-G formula based on SCr of 0.84 mg/dL). Liver Function Tests: Recent Labs  Lab 07/05/19 1406  AST 21  ALT 16  ALKPHOS 28*  BILITOT 1.0  PROT 6.3*  ALBUMIN 3.5   No results for input(s): LIPASE, AMYLASE in the last 168 hours. No results for input(s): AMMONIA in the last 168 hours. Coagulation Profile: Recent Labs  Lab 07/05/19 1406  INR 1.0   Cardiac Enzymes: No results for input(s): CKTOTAL, CKMB, CKMBINDEX, TROPONINI in the last 168 hours. BNP (last 3 results) No results for input(s): PROBNP in the last 8760 hours. HbA1C: No results for input(s): HGBA1C in the last 72 hours. CBG: No results for input(s): GLUCAP in the last 168 hours. Lipid Profile: No results for input(s): CHOL, HDL, LDLCALC, TRIG, CHOLHDL, LDLDIRECT in the last 72 hours. Thyroid Function Tests: No results for  input(s): TSH, T4TOTAL, FREET4, T3FREE, THYROIDAB in the last 72 hours. Anemia Panel: No results for input(s): VITAMINB12, FOLATE, FERRITIN, TIBC, IRON, RETICCTPCT in the last 72 hours. Urine analysis:    Component Value Date/Time   COLORURINE AMBER (A) 01/19/2017 1438   APPEARANCEUR HAZY (A) 01/19/2017 1438   LABSPEC 1.017 01/19/2017 1438   PHURINE 5.0 01/19/2017 1438   GLUCOSEU NEGATIVE 01/19/2017 1438   HGBUR NEGATIVE 01/19/2017 1438   BILIRUBINUR NEGATIVE 01/19/2017 1438   KETONESUR NEGATIVE 01/19/2017 1438   PROTEINUR NEGATIVE 01/19/2017 1438   NITRITE NEGATIVE 01/19/2017 1438   LEUKOCYTESUR MODERATE (A) 01/19/2017 1438    Radiological Exams on Admission: DG Forearm Left  Result Date: 07/05/2019 CLINICAL DATA:  Pain EXAM: LEFT FOREARM - 2 VIEW COMPARISON:  None. FINDINGS: There are acute impacted intra-articular fractures of both the distal radius and ulna. There is significant surrounding soft tissue swelling and volar angulation of the fracture fragments. There are multiple small well corticated osseous fragments projecting in the dorsal soft tissues at the level of the wrist. These may be related to an old remote injury such as a prior triquetral fracture. There is an acute displaced fracture of the distal humerus. IMPRESSION: 1. Acute impacted intra-articular fractures of the distal radius and ulna with significant surrounding soft tissue swelling. 2. Acute displaced fracture of the distal humerus. Electronically Signed   By: Constance Holster M.D.   On: 07/05/2019 15:08   CT Head Wo Contrast  Result Date:  07/05/2019 CLINICAL DATA:  [place] downstairs, left scalp laceration EXAM: CT HEAD WITHOUT CONTRAST CT CERVICAL SPINE WITHOUT CONTRAST TECHNIQUE: Multidetector CT imaging of the head and cervical spine was performed following the standard protocol without intravenous contrast. Multiplanar CT image reconstructions of the cervical spine were also generated. COMPARISON:  04/26/2019  FINDINGS: CT HEAD FINDINGS Brain: No acute infarct or hemorrhage. Lateral ventricles and midline structures are unremarkable. No acute extra-axial fluid collections. No mass effect. Vascular: No hyperdense vessel or unexpected calcification. Skull: There is a left frontal scalp laceration. No underlying fracture. No destructive bony lesions. Sinuses/Orbits: No acute finding. Other: None. CT CERVICAL SPINE FINDINGS Alignment: Alignment is grossly anatomic. Skull base and vertebrae: No acute displaced fractures. Soft tissues and spinal canal: No prevertebral fluid or swelling. No visible canal hematoma. Disc levels: Multilevel cervical spondylosis is seen from C3/C4 through C6/C7. Circumferential disc osteophyte complex and bilateral uncovertebral hypertrophy contribute to mild symmetrical neural foraminal encroachment at C3/C4, C4/C5, and C5/C6. Upper chest: Upper lobe emphysematous changes are seen. The airway is patent. Other: Reconstructed images demonstrate no additional findings. IMPRESSION: 1. Left frontal scalp laceration. No underlying fracture. 2. No acute intracranial process. 3. Multilevel cervical spondylosis. 4. No cervical spine fracture. Electronically Signed   By: Randa Ngo M.D.   On: 07/05/2019 16:24   CT Cervical Spine Wo Contrast  Result Date: 07/05/2019 CLINICAL DATA:  [place] downstairs, left scalp laceration EXAM: CT HEAD WITHOUT CONTRAST CT CERVICAL SPINE WITHOUT CONTRAST TECHNIQUE: Multidetector CT imaging of the head and cervical spine was performed following the standard protocol without intravenous contrast. Multiplanar CT image reconstructions of the cervical spine were also generated. COMPARISON:  04/26/2019 FINDINGS: CT HEAD FINDINGS Brain: No acute infarct or hemorrhage. Lateral ventricles and midline structures are unremarkable. No acute extra-axial fluid collections. No mass effect. Vascular: No hyperdense vessel or unexpected calcification. Skull: There is a left frontal scalp  laceration. No underlying fracture. No destructive bony lesions. Sinuses/Orbits: No acute finding. Other: None. CT CERVICAL SPINE FINDINGS Alignment: Alignment is grossly anatomic. Skull base and vertebrae: No acute displaced fractures. Soft tissues and spinal canal: No prevertebral fluid or swelling. No visible canal hematoma. Disc levels: Multilevel cervical spondylosis is seen from C3/C4 through C6/C7. Circumferential disc osteophyte complex and bilateral uncovertebral hypertrophy contribute to mild symmetrical neural foraminal encroachment at C3/C4, C4/C5, and C5/C6. Upper chest: Upper lobe emphysematous changes are seen. The airway is patent. Other: Reconstructed images demonstrate no additional findings. IMPRESSION: 1. Left frontal scalp laceration. No underlying fracture. 2. No acute intracranial process. 3. Multilevel cervical spondylosis. 4. No cervical spine fracture. Electronically Signed   By: Randa Ngo M.D.   On: 07/05/2019 16:24   CT ELBOW LEFT WO CONTRAST  Result Date: 07/05/2019 CLINICAL DATA:  Fracture. EXAM: CT OF THE UPPER LEFT EXTREMITY WITHOUT CONTRAST 3-DIMENSIONAL CT IMAGE RENDERING ON ACQUISITION WORKSTATION TECHNIQUE: Multidetector CT imaging of the upper left extremity was performed according to the standard protocol. 3-dimensional CT images were rendered by post-processing of the original CT data on an acquisition workstation. The 3-dimensional CT images were interpreted and findings were reported in the accompanying complete CT report for this study COMPARISON:  X-ray from same day FINDINGS: Bones/Joint/Cartilage Again noted is an acute, significantly displaced fracture of the distal humerus. There is significant overlap of the proximal and distal fracture fragments. Multiple small fracture fragments surround the fracture site. There is a questionable nondisplaced fracture involving the radial head (series 13, image 48). Ligaments Suboptimally assessed by CT. Muscles and  Tendons  The muscles are unremarkable by CT standards. Soft tissues There is soft tissue swelling about the elbow. There is an elbow joint effusion. IMPRESSION: 1. Acute, significantly displaced supracondylar fracture of the distal humerus. 2. Questionable nondisplaced fracture of the radial head, favored to represent artifact. Electronically Signed   By: Constance Holster M.D.   On: 07/05/2019 19:09   CT 3D RECON AT SCANNER  Result Date: 07/05/2019 CLINICAL DATA:  Fracture. EXAM: CT OF THE UPPER LEFT EXTREMITY WITHOUT CONTRAST 3-DIMENSIONAL CT IMAGE RENDERING ON ACQUISITION WORKSTATION TECHNIQUE: Multidetector CT imaging of the upper left extremity was performed according to the standard protocol. 3-dimensional CT images were rendered by post-processing of the original CT data on an acquisition workstation. The 3-dimensional CT images were interpreted and findings were reported in the accompanying complete CT report for this study COMPARISON:  X-ray from same day FINDINGS: Bones/Joint/Cartilage Again noted is an acute, significantly displaced fracture of the distal humerus. There is significant overlap of the proximal and distal fracture fragments. Multiple small fracture fragments surround the fracture site. There is a questionable nondisplaced fracture involving the radial head (series 13, image 48). Ligaments Suboptimally assessed by CT. Muscles and Tendons The muscles are unremarkable by CT standards. Soft tissues There is soft tissue swelling about the elbow. There is an elbow joint effusion. IMPRESSION: 1. Acute, significantly displaced supracondylar fracture of the distal humerus. 2. Questionable nondisplaced fracture of the radial head, favored to represent artifact. Electronically Signed   By: Constance Holster M.D.   On: 07/05/2019 19:09   DG Chest Portable 1 View  Result Date: 07/05/2019 CLINICAL DATA:  Fall, hypoxia. EXAM: PORTABLE CHEST 1 VIEW COMPARISON:  None. FINDINGS: Lungs are somewhat  hypoinflated with no lobar consolidation or effusion. Borderline cardiomegaly. Left shoulder arthroplasty intact. IMPRESSION: No active disease. Electronically Signed   By: Marin Olp M.D.   On: 07/05/2019 16:43   DG Humerus Left  Result Date: 07/05/2019 CLINICAL DATA:  Pain EXAM: LEFT HUMERUS - 2+ VIEW COMPARISON:  None. FINDINGS: The patient is status post total shoulder arthroplasty. There is an acute, significantly displaced fracture of the distal humerus. There is extensive surrounding soft tissue swelling. There is no definite dislocation, however evaluation is limited by patient positioning. IMPRESSION: 1. Acute significantly displaced fracture of the distal humerus. 2. Status post total shoulder arthroplasty. Electronically Signed   By: Constance Holster M.D.   On: 07/05/2019 15:09    EKG: Independently reviewed.   Assessment/Plan Principal Problem:   Acute respiratory failure with hypoxia ([place]) -Patient with no prior history of oxygen use with O2 sats down to 85% on room air in the ER after presenting for mechanical fall with injuries -Possibly related to splinting secondary to pain.patient denies chest pain or shortness of breath  -Chest x-ray showed hyperinflated lungs but no acute process  -Venous blood gas was unremarkable.  PCO2 was 24 -Supplemental oxygen to keep sats over 92%  -Suspect underlying chronic hypoxia secondary to obstructive sleep apnea.  No evidence of pneumonia.  Low suspicion for other etiologies such as PE as patient denies shortness of breath.  Has no tachycardia. -Continue to monitor  Accidental fall down steps -Patient presents with an accidental fall in which she tripped over boxes fracturing distal end of left humerus and left wrist and sustaining a laceration to her forehead -Was evaluated by orthopedist Dr. Mack Guise in the emergency room has made a follow-up appointment for her with a hand specialist -Pain management.  Supportive care  Personal  history of fall   Peripheral neuropathy   Benign essential tremor -Follow-up with neurology -Consider physical therapy evaluation  Laceration left scalp -Sutured in the emergency room -Keep wound clean. --  Arrange for suture removal at discharge    Obstructive sleep apnea -CPAP as desired have given him yesterday  DVT prophylaxis: Lovenox.  Code Status: full code  Family Communication:  none  Disposition Plan: Back to previous home environment Consults called: none  Status:obs    Athena Masse MD [facility]     07/05/2019, 8:02 PM

## 2019-07-05 NOTE — ED Notes (Signed)
Resumed care from Malayna Noori t rn.  Pt alert  Family with pt.

## 2019-07-05 NOTE — ED Notes (Signed)
Dr Quentin Cornwall in with pt  Pain meds given.   Wedding band removed from left ring finger.  Given to husband.

## 2019-07-05 NOTE — ED Provider Notes (Signed)
Case discussed in consultation with Dr. Mack Guise of orthopedics.  Discussed my concern for possible puncture site and open fracture.  Has recommended Ancef but states no indication for urgent washout or surgical management.  Is recommended posterior splinting basic wound care.  Tetanus was updated.  States no indication for reduction at this point.  [facility] Injury Treatment  Date/Time: 07/05/2019 5:25 PM Performed by: Merlyn Lot, MD Authorized by: Merlyn Lot, MD   Consent:    Consent obtained:  Verbal   Consent given by:  Patient   Risks discussed:  Fracture, irreducible dislocation, nerve damage, recurrent dislocation, restricted joint movement, stiffness and vascular damage   Alternatives discussed:  Alternative treatment, immobilization, referral and delayed treatmentInjury location: elbow Location details: left elbow Injury type: fracture (Distal radius and distal humerus) Pre-procedure neurovascular assessment: neurovascularly intact Pre-procedure distal perfusion: normal Pre-procedure neurological function: normal Pre-procedure range of motion: reduced Manipulation performed: no Immobilization: splint Splint type: posterior long arm. Supplies used: Ortho-Glass Post-procedure neurovascular assessment: post-procedure neurovascularly intact Patient tolerance: patient tolerated the procedure well with no immediate complications    Patient has been eval by orthopedics at bedside.  Patient with persistent wheezing and is dropping her sats.  May be component of atelectasis but she is a smoker does not wear home oxygen.  May be a component of multiple narcotic medications however patient is alert oriented no signs of dyspnea and still dropping her sats to the low to mid 80s requiring supplemental oxygen.  Will give nebs which does seem to help with the wheezing will discuss with hospitalist for observation and pain control.    Merlyn Lot, MD 07/05/19 [phone]

## 2019-07-05 NOTE — ED Notes (Addendum)
Splint reapplied to left arm by dr Mack Guise  Pt tolerated well.  Sling applied.  Pt taken off oxygen

## 2019-07-05 NOTE — ED Notes (Signed)
Pt sitting up and eating and drinking now.

## 2019-07-05 NOTE — ED Notes (Addendum)
Dr Quentin Cornwall in with pt again.  Pt receiving breathing treatment.

## 2019-07-05 NOTE — ED Notes (Signed)
Pt placed back on 3 liters oxygen [place].  md aware.

## 2019-07-05 NOTE — ED Notes (Addendum)
Dr Mack Guise  In with pt  Now again

## 2019-07-05 NOTE — Consult Note (Signed)
ORTHOPAEDIC CONSULTATION  REQUESTING PHYSICIAN: No att. providers found  Chief Complaint: Left distal forearm and distal humerus fractures s/p fall  HPI: Annette Quinn is a 69 y.o. female who complains of left upper extremity pain after she tripped over [employer] boxes outside her door.  Patient fell onto her left side, hitting her forehead and injuring her left upper extremity.  Brought to the ED by EMS.  Patient is seen in ED.  Her husband is in the room.  Dr. Merlyn Lot, the ER physician, has just completed splinting the patient.  He reports the patient had abrasions over the left forearm and posterior elbow, but after cleaning them, no significant open wounds existed.  He states that if there was an inside out puncture wound over the posterior elbow, it was very small.   The patient denies other extremity injuries.  She denies loss of sensation or motor function to her left hand.  Past Medical History:  Diagnosis Date  . Adopted   . Arthritis    osteoarthritis  . Blood clot in vein 1975  . Cancer ([place])   . Complication of anesthesia    body aches after anesthesia  . Depression   . Diverticulosis   . GERD (gastroesophageal reflux disease)   . Hepatitis    as a child  . History of bronchitis   . Hypercholesterolemia   . Motion sickness    curvy/hilly roads  . Neuropathy    upper legs  . Osteoporosis   . Skin cancer (melanoma) ([place])    left upper thigh  . Skin cancer of face    basal cell  . Sleep apnea    CPAP  . Tremor   . Wears dentures    full upper and lower   Past Surgical History:  Procedure Laterality Date  . ABDOMINAL HYSTERECTOMY  1979  . BLADDER SUSPENSION  1992  . BREAST BIOPSY Left 2010   benign  . CATARACT EXTRACTION W/PHACO Right 12/08/2016   Procedure: CATARACT EXTRACTION PHACO AND INTRAOCULAR LENS PLACEMENT (IOC)-RIGHT;  Surgeon: Birder Robson, MD;  Location: [facility];  Service: Ophthalmology;  Laterality: Right;  [place] 00:56.6 AP% 11.5 CDE  6.52 Fluid Pack lot # [id number]  . CATARACT EXTRACTION W/PHACO Left 01/04/2017   Procedure: CATARACT EXTRACTION PHACO AND INTRAOCULAR LENS PLACEMENT (IOC);  Surgeon: Birder Robson, MD;  Location: [facility];  Service: Ophthalmology;  Laterality: Left;  [place] 00:33.9 AP% 14.5 CDE 4.91 Fluid Pack lot # [id number]  . COLONOSCOPY  2010, 2015  . COLONOSCOPY WITH PROPOFOL N/A 05/02/2019   Procedure: COLONOSCOPY WITH PROPOFOL;  Surgeon: Toledo, Benay Pike, MD;  Location: [facility];  Service: Gastroenterology;  Laterality: N/A;  . DILATION AND CURETTAGE OF UTERUS  1979  . DISTAL INTERPHALANGEAL JOINT FUSION Left 08/26/2016   Procedure: DISTAL INTERPHALANGEAL JOINT FUSION-LEFT INDEX FINGER;  Surgeon: Hessie Knows, MD;  Location: [facility];  Service: Orthopedics;  Laterality: Left;  . HAMMER TOE SURGERY Left 1989  . HAMMER TOE SURGERY Right 03/31/2016   Procedure: HAMMER TOE CORRECTION RIGHT T6 T7;  Surgeon: Samara Deist, DPM;  Location: [place];  Service: Podiatry;  Laterality: Right;  sleep apnea  . KNEE ARTHROSCOPY W/ PARTIAL MEDIAL MENISCECTOMY Right 03/05/2014   medial and lateral.  Abrasion hondroplasty of medial femoral condyle and femorla trochlea.  Marland Kitchen NASAL SEPTUM SURGERY    . REVERSE SHOULDER ARTHROPLASTY Left 01/20/2017   Procedure: REVERSE SHOULDER ARTHROPLASTY;  Surgeon: Corky Mull, MD;  Location: [facility];  Service: Orthopedics;  Laterality: Left;  . TONSILLECTOMY  1973  . TRIGGER FINGER RELEASE Left    thumb  . VEIN LIGATION    . WEIL OSTEOTOMY Right 03/31/2016   Procedure: WEIL OSTEOTOMY X2 RIGHT FOOT;  Surgeon: Samara Deist, DPM;  Location: [place];  Service: Podiatry;  Laterality: Right;  IV WITH POPLITEAL   Social History   Socioeconomic History  . Marital status: Married    Spouse name: Not on file  . Number of children: Not on file  . Years of education: Not on file  . Highest education level: Not on file  Occupational History  . Not on file   Tobacco Use  . Smoking status: Former Smoker    Years: 43.00    Quit date: 07/20/2010    Years since quitting: 8.9  . Smokeless tobacco: Never Used  Substance and Sexual Activity  . Alcohol use: No  . Drug use: No  . Sexual activity: Not on file  Other Topics Concern  . Not on file  Social History Narrative  . Not on file   Social Determinants of Health   Financial Resource Strain:   . Difficulty of Paying Living Expenses:   Food Insecurity:   . Worried About Charity fundraiser in the Last Year:   . Arboriculturist in the Last Year:   Transportation Needs:   . Film/video editor (Medical):   Marland Kitchen Lack of Transportation (Non-Medical):   Physical Activity:   . Days of Exercise per Week:   . Minutes of Exercise per Session:   Stress:   . Feeling of Stress :   Social Connections:   . Frequency of Communication with Friends and Family:   . Frequency of Social Gatherings with Friends and Family:   . Attends Religious Services:   . Active Member of Clubs or Organizations:   . Attends Archivist Meetings:   Marland Kitchen Marital Status:    Family History  Adopted: Yes   Allergies  Allergen Reactions  . Actonel [Risedronate Sodium]   . Boniva [Ibandronic Acid]   . Fosamax [Alendronate]     Severe muscle aches  . Lopid [Gemfibrozil]     headache  . Penicillins Other (See Comments)    (injectable only) - knot at site Has patient had a PCN reaction causing immediate rash, facial/tongue/throat swelling, SOB or lightheadedness with hypotension:NO Has patient had a PCN reaction causing severe rash involving mucus membranes or skin necrosis: No Has patient had a PCN reaction that required hospitalization: No Has patient had a PCN reaction occurring within the last 10 years: No If all of the above answers are "NO", then may proceed with Cephalosporin use.   Marland Kitchen Reclast [Zoledronic Acid]     Severe muscle aches  . Strawberry (Diagnostic) Itching  . Tape     Tears skin  (tegaderm and paper tape OK)  . Doxycycline Rash   Prior to Admission medications   Medication Sig Start Date End Date Taking? Authorizing Provider  acetaminophen (TYLENOL) 650 MG CR tablet Take 650-1,300 mg by mouth every 8 (eight) hours as needed for pain.     [provider]  albuterol (PROVENTIL HFA;VENTOLIN HFA) 108 (90 Base) MCG/ACT inhaler Inhale 2 puffs into the lungs every 6 (six) hours as needed for wheezing or shortness of breath.     [provider]  Azelastine-Fluticasone 137-50 MCG/ACT SUSP Place 1 spray into the nose 2 (two) times daily as needed (for allergies.).  [provider]  Betamethasone Valerate 0.12 % foam Apply 1 application topically as needed (eczema).  11/01/16   [provider]  bumetanide (BUMEX) 1 MG tablet Take 1 mg by mouth 2 (two) times daily.     [provider]  busPIRone (BUSPAR) 7.5 MG tablet Take 7.5 mg by mouth 2 (two) times daily.    [provider]  CALCIUM PO Take 1 tablet by mouth daily.    [provider]  Cholecalciferol 2000 units CAPS Take 2,000 Units by mouth daily before breakfast.     [provider]  diclofenac sodium (VOLTAREN) 1 % GEL Apply 2-4 g topically 4 (four) times daily as needed (for pain.).     [provider]  enoxaparin (LOVENOX) 40 MG/0.4ML injection Inject 0.4 mLs (40 mg total) into the skin daily. 01/21/17   Lattie Corns, PA-C  EPINEPHrine (AUVI-Q) 0.3 mg/0.3 mL IJ SOAJ injection Inject 0.3 mg into the muscle daily as needed (for anaphylatic allergic reaction).    [provider]  fenofibrate 160 MG tablet Take 160 mg by mouth daily at 12 noon.  08/01/16   [provider]  Fluocinolone Acetonide 0.01 % OIL Apply 1 application topically as needed (eczema).  11/01/16   [provider]  gabapentin (NEURONTIN) 300 MG capsule Take 600 mg by mouth 3 (three) times daily.  07/26/16   [provider]   GLUCOSAMINE-CHONDROITIN PO Take 1 tablet by mouth daily.    [provider]  hydrocortisone 2.5 % cream Apply 1 application topically 2 (two) times daily as needed ((typically uses once daily)). Mix equal parts with ketoconazole 2% applied affected area(s) on face    [provider]  ketoconazole (NIZORAL) 2 % cream Apply 1 application topically 2 (two) times daily as needed for irritation ((typically uses once daily)). Mix equal parts with hydrocortisone 2.5% applied affected area(s) on face    [provider]  montelukast (SINGULAIR) 10 MG tablet Take 10 mg by mouth every morning.     [provider]  oxyCODONE (OXY IR/ROXICODONE) 5 MG immediate release tablet Take 1-2 tablets (5-10 mg total) by mouth every 4 (four) hours as needed for severe pain. 01/21/17   Lattie Corns, PA-C  pantoprazole (PROTONIX) 40 MG tablet Take 40 mg by mouth See admin instructions. Take 1 tablet (40 mg) scheduled every morning; may take additional dose later if needed    [provider]  propranolol (INDERAL) 20 MG tablet Take 20 mg by mouth 2 (two) times daily.    [provider]  raloxifene (EVISTA) 60 MG tablet Take 60 mg by mouth daily at 12 noon.     [provider]  sertraline (ZOLOFT) 100 MG tablet Take 200 mg by mouth daily with lunch.     [provider]  traMADol (ULTRAM) 50 MG tablet Take 1-2 tablets (50-100 mg total) by mouth every 6 (six) hours as needed. 01/21/17   Lattie Corns, PA-C   DG Forearm Left  Result Date: 07/05/2019 CLINICAL DATA:  Pain EXAM: LEFT FOREARM - 2 VIEW COMPARISON:  None. FINDINGS: There are acute impacted intra-articular fractures of both the distal radius and ulna. There is significant surrounding soft tissue swelling and volar angulation of the fracture fragments. There are multiple small well corticated osseous fragments projecting in the dorsal soft tissues at the level of the wrist. These may be  related to an old remote injury such as a prior triquetral fracture. There is an  acute displaced fracture of the distal humerus. IMPRESSION: 1. Acute impacted intra-articular fractures of the distal radius and ulna with significant surrounding soft tissue swelling. 2. Acute displaced fracture of the distal humerus. Electronically Signed   By: Constance Holster M.D.   On: 07/05/2019 15:08   CT Head Wo Contrast  Result Date: 07/05/2019 CLINICAL DATA:  [place] downstairs, left scalp laceration EXAM: CT HEAD WITHOUT CONTRAST CT CERVICAL SPINE WITHOUT CONTRAST TECHNIQUE: Multidetector CT imaging of the head and cervical spine was performed following the standard protocol without intravenous contrast. Multiplanar CT image reconstructions of the cervical spine were also generated. COMPARISON:  04/26/2019 FINDINGS: CT HEAD FINDINGS Brain: No acute infarct or hemorrhage. Lateral ventricles and midline structures are unremarkable. No acute extra-axial fluid collections. No mass effect. Vascular: No hyperdense vessel or unexpected calcification. Skull: There is a left frontal scalp laceration. No underlying fracture. No destructive bony lesions. Sinuses/Orbits: No acute finding. Other: None. CT CERVICAL SPINE FINDINGS Alignment: Alignment is grossly anatomic. Skull base and vertebrae: No acute displaced fractures. Soft tissues and spinal canal: No prevertebral fluid or swelling. No visible canal hematoma. Disc levels: Multilevel cervical spondylosis is seen from C3/C4 through C6/C7. Circumferential disc osteophyte complex and bilateral uncovertebral hypertrophy contribute to mild symmetrical neural foraminal encroachment at C3/C4, C4/C5, and C5/C6. Upper chest: Upper lobe emphysematous changes are seen. The airway is patent. Other: Reconstructed images demonstrate no additional findings. IMPRESSION: 1. Left frontal scalp laceration. No underlying fracture. 2. No acute intracranial process. 3. Multilevel cervical spondylosis.  4. No cervical spine fracture. Electronically Signed   By: Randa Ngo M.D.   On: 07/05/2019 16:24   CT Cervical Spine Wo Contrast  Result Date: 07/05/2019 CLINICAL DATA:  [place] downstairs, left scalp laceration EXAM: CT HEAD WITHOUT CONTRAST CT CERVICAL SPINE WITHOUT CONTRAST TECHNIQUE: Multidetector CT imaging of the head and cervical spine was performed following the standard protocol without intravenous contrast. Multiplanar CT image reconstructions of the cervical spine were also generated. COMPARISON:  04/26/2019 FINDINGS: CT HEAD FINDINGS Brain: No acute infarct or hemorrhage. Lateral ventricles and midline structures are unremarkable. No acute extra-axial fluid collections. No mass effect. Vascular: No hyperdense vessel or unexpected calcification. Skull: There is a left frontal scalp laceration. No underlying fracture. No destructive bony lesions. Sinuses/Orbits: No acute finding. Other: None. CT CERVICAL SPINE FINDINGS Alignment: Alignment is grossly anatomic. Skull base and vertebrae: No acute displaced fractures. Soft tissues and spinal canal: No prevertebral fluid or swelling. No visible canal hematoma. Disc levels: Multilevel cervical spondylosis is seen from C3/C4 through C6/C7. Circumferential disc osteophyte complex and bilateral uncovertebral hypertrophy contribute to mild symmetrical neural foraminal encroachment at C3/C4, C4/C5, and C5/C6. Upper chest: Upper lobe emphysematous changes are seen. The airway is patent. Other: Reconstructed images demonstrate no additional findings. IMPRESSION: 1. Left frontal scalp laceration. No underlying fracture. 2. No acute intracranial process. 3. Multilevel cervical spondylosis. 4. No cervical spine fracture. Electronically Signed   By: Randa Ngo M.D.   On: 07/05/2019 16:24   DG Chest Portable 1 View  Result Date: 07/05/2019 CLINICAL DATA:  Fall, hypoxia. EXAM: PORTABLE CHEST 1 VIEW COMPARISON:  None. FINDINGS: Lungs are somewhat hypoinflated  with no lobar consolidation or effusion. Borderline cardiomegaly. Left shoulder arthroplasty intact. IMPRESSION: No active disease. Electronically Signed   By: Marin Olp M.D.   On: 07/05/2019 16:43   DG Humerus Left  Result Date: 07/05/2019 CLINICAL DATA:  Pain EXAM: LEFT HUMERUS - 2+ VIEW COMPARISON:  None. FINDINGS: The patient is status  post total shoulder arthroplasty. There is an acute, significantly displaced fracture of the distal humerus. There is extensive surrounding soft tissue swelling. There is no definite dislocation, however evaluation is limited by patient positioning. IMPRESSION: 1. Acute significantly displaced fracture of the distal humerus. 2. Status post total shoulder arthroplasty. Electronically Signed   By: Constance Holster M.D.   On: 07/05/2019 15:09    Positive ROS: All other systems have been reviewed and were otherwise negative with the exception of those mentioned in the HPI and as above.  Physical Exam: General: Awake, mildly sedated from narcotic pain medication, no acute distress.  Laceration over the left forehead is not currently bleeding.  MUSCULOSKELETAL: Posterior splint in place.  Patient's fingers are well perfused.   Patient can actively flex and extend her digits.  She has intact sensation to light touch in all 5 fingers.  Her fingers are well perfused.  Her arm and forearm compartments are soft and compressible.  Assessment: 1.  Displaced left distal humerus fracture 2.  Distal both bone forearm fracture with comminuted distal radius fracture without significant angulation  Plan: I have discussed this case with Dr. Quentin Cornwall in the ER and have recommended splinting.  Patient has been placed in a posterior splint.  I have also recommended IV ancef for any potential small inside out wound.  Ancef has been ordered.   I am also ordering a CT scan of the left elbow for pre-op planning.    I have discussed this case with our hand specialist Dr. Peggye Ley who  will see the patient on Tuesday in the office and will plan for surgery next week once the swelling has improved.  The patient and her husband understand and agree with this plan.     Thornton Park, MD    07/05/2019 5:36 PM

## 2019-07-05 NOTE — ED Notes (Addendum)
Splint removed by dr Tanja Port for ct scan

## 2019-07-05 NOTE — ED Notes (Signed)
Pt in ct scan  

## 2019-07-05 NOTE — ED Notes (Addendum)
Posterior splint applied to left arm by dr Quentin Cornwall, scott edt, Makayla Lanter rn.  Pt tolerated well.    Wounds clean and dressed on left arm prior to splint placement.   Ortho in with pt now.

## 2019-07-05 NOTE — ED Notes (Signed)
Pt placed on 3 liters oxygen via [place].  md aware

## 2019-07-06 ENCOUNTER — Encounter: Payer: Self-pay | Admitting: Internal Medicine

## 2019-07-06 ENCOUNTER — Other Ambulatory Visit: Payer: Self-pay

## 2019-07-06 DIAGNOSIS — Z85820 Personal history of malignant melanoma of skin: Secondary | ICD-10-CM | POA: Diagnosis not present

## 2019-07-06 DIAGNOSIS — J9601 Acute respiratory failure with hypoxia: Secondary | ICD-10-CM | POA: Diagnosis not present

## 2019-07-06 DIAGNOSIS — J449 Chronic obstructive pulmonary disease, unspecified: Secondary | ICD-10-CM | POA: Diagnosis present

## 2019-07-06 DIAGNOSIS — S42409A Unspecified fracture of lower end of unspecified humerus, initial encounter for closed fracture: Secondary | ICD-10-CM | POA: Diagnosis present

## 2019-07-06 DIAGNOSIS — J9621 Acute and chronic respiratory failure with hypoxia: Secondary | ICD-10-CM | POA: Diagnosis present

## 2019-07-06 DIAGNOSIS — F329 Major depressive disorder, single episode, unspecified: Secondary | ICD-10-CM | POA: Diagnosis present

## 2019-07-06 DIAGNOSIS — S42492D Other displaced fracture of lower end of left humerus, subsequent encounter for fracture with routine healing: Secondary | ICD-10-CM

## 2019-07-06 DIAGNOSIS — S42412A Displaced simple supracondylar fracture without intercondylar fracture of left humerus, initial encounter for closed fracture: Secondary | ICD-10-CM | POA: Diagnosis present

## 2019-07-06 DIAGNOSIS — Z86718 Personal history of other venous thrombosis and embolism: Secondary | ICD-10-CM | POA: Diagnosis not present

## 2019-07-06 DIAGNOSIS — Z87891 Personal history of nicotine dependence: Secondary | ICD-10-CM | POA: Diagnosis not present

## 2019-07-06 DIAGNOSIS — Z20822 Contact with and (suspected) exposure to covid-19: Secondary | ICD-10-CM | POA: Diagnosis present

## 2019-07-06 DIAGNOSIS — S52502D Unspecified fracture of the lower end of left radius, subsequent encounter for closed fracture with routine healing: Secondary | ICD-10-CM | POA: Diagnosis not present

## 2019-07-06 DIAGNOSIS — Z79899 Other long term (current) drug therapy: Secondary | ICD-10-CM | POA: Diagnosis not present

## 2019-07-06 DIAGNOSIS — Z9841 Cataract extraction status, right eye: Secondary | ICD-10-CM | POA: Diagnosis not present

## 2019-07-06 DIAGNOSIS — G250 Essential tremor: Secondary | ICD-10-CM | POA: Diagnosis present

## 2019-07-06 DIAGNOSIS — Z96612 Presence of left artificial shoulder joint: Secondary | ICD-10-CM | POA: Diagnosis present

## 2019-07-06 DIAGNOSIS — Z7901 Long term (current) use of anticoagulants: Secondary | ICD-10-CM | POA: Diagnosis not present

## 2019-07-06 DIAGNOSIS — Z961 Presence of intraocular lens: Secondary | ICD-10-CM | POA: Diagnosis present

## 2019-07-06 DIAGNOSIS — G4733 Obstructive sleep apnea (adult) (pediatric): Secondary | ICD-10-CM | POA: Diagnosis present

## 2019-07-06 DIAGNOSIS — Z23 Encounter for immunization: Secondary | ICD-10-CM | POA: Diagnosis present

## 2019-07-06 DIAGNOSIS — S0101XA Laceration without foreign body of scalp, initial encounter: Secondary | ICD-10-CM | POA: Diagnosis present

## 2019-07-06 DIAGNOSIS — T148XXA Other injury of unspecified body region, initial encounter: Secondary | ICD-10-CM | POA: Diagnosis present

## 2019-07-06 DIAGNOSIS — W19XXXD Unspecified fall, subsequent encounter: Secondary | ICD-10-CM | POA: Diagnosis not present

## 2019-07-06 DIAGNOSIS — W108XXA Fall (on) (from) other stairs and steps, initial encounter: Secondary | ICD-10-CM

## 2019-07-06 DIAGNOSIS — G5710 Meralgia paresthetica, unspecified lower limb: Secondary | ICD-10-CM | POA: Diagnosis present

## 2019-07-06 DIAGNOSIS — Z9842 Cataract extraction status, left eye: Secondary | ICD-10-CM | POA: Diagnosis not present

## 2019-07-06 DIAGNOSIS — Z90710 Acquired absence of both cervix and uterus: Secondary | ICD-10-CM | POA: Diagnosis not present

## 2019-07-06 DIAGNOSIS — K219 Gastro-esophageal reflux disease without esophagitis: Secondary | ICD-10-CM | POA: Diagnosis present

## 2019-07-06 DIAGNOSIS — W109XXA Fall (on) (from) unspecified stairs and steps, initial encounter: Secondary | ICD-10-CM | POA: Diagnosis present

## 2019-07-06 DIAGNOSIS — E7800 Pure hypercholesterolemia, unspecified: Secondary | ICD-10-CM | POA: Diagnosis present

## 2019-07-06 DIAGNOSIS — G629 Polyneuropathy, unspecified: Secondary | ICD-10-CM | POA: Diagnosis present

## 2019-07-06 DIAGNOSIS — S52502A Unspecified fracture of the lower end of left radius, initial encounter for closed fracture: Secondary | ICD-10-CM | POA: Diagnosis present

## 2019-07-06 LAB — HIV ANTIBODY (ROUTINE TESTING W REFLEX): HIV Screen 4th Generation wRfx: NONREACTIVE

## 2019-07-06 MED ORDER — POTASSIUM CHLORIDE CRYS ER 20 MEQ PO TBCR
40.0000 meq | EXTENDED_RELEASE_TABLET | Freq: Once | ORAL | Status: AC
  Administered 2019-07-06: 40 meq via ORAL
  Filled 2019-07-06: qty 2

## 2019-07-06 NOTE — Evaluation (Signed)
Occupational Therapy Evaluation Patient Details Name: Annette Quinn MRN: [id number] DOB: [birth year] Today's Date: 07/06/2019    History of Present Illness Annette Quinn is a 69yoF who [facility] on 5/[facility] after a fall. Pt was bending over to pick up soem packages at home and fell down 6 stairs. PMH: peripheral neuropathy, meralgia paresthetica, essential tremor, unsteadiness, falls. work-up revealed laceration to the left side of her head that was sutured, as well as displaced left distal humerus fracture and distal both bone forearm fracture. In 2018 pt sustained a fall up 2 stairs at home   Lt humeral fracture converted to a rTSA.   Clinical Impression   Patient was seen for an OT evaluation this date. Pt lives with spouse and son in [facility] with 4 STE. Prior to surgery, pt was active and independent. Pt is to have L UE to be immobilized and will be NWBing. Patient presents with impaired strength/ROM and pain to L UE. These impairments result in a decreased ability to perform self care tasks requiring MOD assist for UB dressing and bathing, MAX A for LB, and MAX A for application of sling/immobilizer. Pt instructed in sling/immobilizer mgt, L UE precautions, adaptive strategies for bathing/dressing/toileting/grooming, positioning and considerations for sleep, and home/routines modifications to maximize falls prevention, safety, and independence. Handout provided. OT adjusted sling/immobilizer to improve comfort, optimize positioning, and to maximize skin integrity/safety. Pt verbalized understanding of all education/training provided. Pt will benefit from skilled OT services to address these limitations and improve independence in daily tasks. Recommend HHOT services to continue therapy to maximize return to PLOF, address home/routines modifications and safety, minimize falls risk, and minimize caregiver burden.      Follow Up Recommendations  Home health OT;Supervision - Intermittent    Equipment  Recommendations  3 in 1 bedside commode;Tub/shower seat    Recommendations for Other Services       Precautions / Restrictions Precautions Precautions: Fall Required Braces or Orthoses: Sling;Splint/Cast Splint/Cast: LUE splint (donned at entry) total LUE Restrictions Weight Bearing Restrictions: Yes LUE Weight Bearing: Non weight bearing(assumed d/t nature of splint and sling)      Mobility Bed Mobility Overal bed mobility: Needs Assistance Bed Mobility: Supine to Sit     Supine to sit: Max assist;HOB elevated     General bed mobility comments: up to chair pre/post session  Transfers       General transfer comment: NT as pt up chair with PT prior and reports having just gotten comfortable following pain medication.    Balance Overall balance assessment: Needs assistance;History of Falls                                  ADL either performed or assessed with clinical judgement   ADL Overall ADL's : Needs assistance/impaired Eating/Feeding: Set up;Sitting   Grooming: Wash/dry hands;Wash/dry face;Oral care;Set up;Sitting   Upper Body Bathing: Minimal assistance;Moderate assistance;Sitting   Lower Body Bathing: Moderate assistance;Maximal assistance;Sitting/lateral leans Lower Body Bathing Details (indicate cue type and reason): based on clnical observation Upper Body Dressing : Moderate assistance;Sitting   Lower Body Dressing: Maximal assistance;Sitting/lateral leans Lower Body Dressing Details (indicate cue type and reason): based on clinical obs   Toilet Transfer Details (indicate cue type and reason): NT, pt just seen by PT prior and reports having just got comfortable in chair  Vision Baseline Vision/History: Wears glasses Patient Visual Report: No change from baseline       Perception     Praxis      Pertinent Vitals/Pain Pain Assessment: 0-10 Pain Score: 5 ((decreased since PT, got pain medicine recently) Pain  Location: Left lower arm Pain Descriptors / Indicators: Aching;Grimacing Pain Intervention(s): Limited activity within patient's tolerance;Monitored during session;Premedicated before session     Hand Dominance Right   Extremity/Trunk Assessment Upper Extremity Assessment Upper Extremity Assessment: Generalized weakness;RUE deficits/detail;LUE deficits/detail RUE Deficits / Details: ROM WFL, grossly 4/5 MMT LUE: Unable to fully assess due to pain;Unable to fully assess due to immobilization   Lower Extremity Assessment Lower Extremity Assessment: Defer to PT evaluation       Communication Communication Communication: No difficulties   Cognition Arousal/Alertness: Awake/alert Behavior During Therapy: WFL for tasks assessed/performed Overall Cognitive Status: Within Functional Limits for tasks assessed                                     General Comments       Exercises Other Exercises Other Exercises: OT facilitates education re: precautions with L UE, dressing and bathing considerations, sleep considerations, sling application and wear. Pt somewhat familiar d/t shoulder dx 69YA. Good reception of education today.   Shoulder Instructions      Home Living Family/patient expects to be discharged to:: Private residence Living Arrangements: Spouse/significant other;Children(28 y/o son with BI, could help some) Available Help at Discharge: Family;Available 24 hours/day Type of Home: House Home Access: Stairs to enter [employer] of Steps: 4 Entrance Stairs-Rails: Right;Left Home Layout: One level     Bathroom Shower/Tub: Tub/shower unit;Walk-in shower   Bathroom Toilet: Handicapped height     Home Equipment: Environmental consultant - 4 wheels;Walker - standard;Shower seat;Grab bars - tub/shower          Prior Functioning/Environment Level of Independence: Independent with assistive device(s)        Comments: SPC for most AMB due to unsteadiness; PMH:  fall up stairs  with Left humeral fracture 2018 (converted to Left reverse TSA)        OT Problem List: Decreased strength;Decreased range of motion;Decreased activity tolerance;Impaired balance (sitting and/or standing);Decreased coordination;Impaired UE functional use;Pain      OT Treatment/Interventions: Self-care/ADL training;Therapeutic exercise;DME and/or AE instruction;Therapeutic activities;Patient/family education;Balance training    OT Goals(Current goals can be found in the care plan section) Acute Rehab OT Goals Patient Stated Goal: prevent future falls and injuries OT Goal Formulation: With patient Time For Goal Achievement: 07/20/19 Potential to Achieve Goals: Good  OT Frequency: Min 2X/week   Barriers to D/C:            Co-evaluation              AM-PAC OT "6 Clicks" Daily Activity     Outcome Measure Help from another person eating meals?: A Little Help from another person taking care of personal grooming?: A Little Help from another person toileting, which includes using toliet, bedpan, or urinal?: A Lot Help from another person bathing (including washing, rinsing, drying)?: A Little Help from another person to put on and taking off regular upper body clothing?: A Lot Help from another person to put on and taking off regular lower body clothing?: A Lot 6 Click Score: 15   End of Session Nurse Communication: Mobility status  Activity Tolerance: Patient tolerated treatment well Patient left: in chair;with  call bell/phone within reach;with bed alarm set;with chair alarm set  OT Visit Diagnosis: Unsteadiness on feet (R26.81);Muscle weakness (generalized) (M62.81);Pain;History of falling (Z91.81) Pain - Right/Left: Left Pain - part of body: Arm                Time: 8832-5498 OT Time Calculation (min): 19 min Charges:  OT General Charges $OT Visit: 1 Visit OT Evaluation $OT Eval Moderate Complexity: 1 Mod OT Treatments $Self Care/Home Management :  8-22 mins  Gerrianne Scale, MS, OTR/L ascom [phone] 07/06/19, 4:32 PM

## 2019-07-06 NOTE — Progress Notes (Addendum)
Progress Note    Annette Quinn  FXT:[id number] DOB: August 05, 1950  DOA: 07/05/2019 PCP: Kirk Ruths, MD      Brief Narrative:    Medical records reviewed and are as summarized below:  Annette Quinn is an 68 y.o. female with medical history significant for peripheral neuropathy and meralgia paresthetica, essential tremor, OSA on CPAP, followed by neurology with history of imbalance and history of a prior fall who presented to the emergency room on 07/05/2019 following a fall.  Patient said she bent over to pick up some [employer] boxes that were left on her stairs and she fell forward, going down 6 stairs. She fell onto the left side sustaining injury to the left upper extremity and left frontal scalp.       Assessment/Plan:   Principal Problem:   Acute respiratory failure with hypoxia (HCC) Active Problems:   Accidental fall   Peripheral neuropathy   Benign essential tremor   Obstructive sleep apnea   Fracture of distal end of left humerus   Closed fracture of distal end of left radius   Personal history of fall   Wrist fracture, closed, left, initial encounter   Scalp laceration, initial encounter   Fall down steps  Acute hypoxemic respiratory failure, probable underlying chronic hypoxemic respiratory failure -Continue oxygen via nasal cannula.  Taper off as able.  Patient will probably need home oxygen.  Continue to monitor  Accidental fall down steps/displaced fracture of distal end of left humerus, fracture of distal end of left radius -Patient presents with an accidental fall in which she tripped over boxes fracturing distal end of left humerus and left wrist and sustaining a laceration to her forehead -She was evaluated by orthopedist Dr. Mack Guise who recommended outpatient follow-up  with Dr. Peggye Ley, hand specialist -Pain management.  Patient is still requiring IV morphine for pain control. Supportive care    Personal history of fall   Peripheral neuropathy    Benign essential tremor -Outpatient follow-up with neurology  Laceration left scalp -Sutured in the emergency room -Keep wound clean. --  Arrange for suture removal at discharge    Obstructive sleep apnea -CPAP at night    Body mass index is 39.95 kg/m.  (Morbid obesity): This complicates overall prognosis and care   Family Communication/Anticipated D/C date and plan/Code Status   DVT prophylaxis: Lovenox Code Status: Full code Family Communication: Plan discussed with her husband Disposition Plan:    Status is: Observation  The patient will require care spanning > 2 midnights and should be moved to inpatient because: Ongoing active pain requiring inpatient pain management and IV treatments appropriate due to intensity of illness or inability to take PO  Dispo: The patient is from: Home              Anticipated d/c is to: Home              Anticipated d/c date is: 1 day              Patient currently is not medically stable to d/c.           Subjective:   Overnight events noted.  C/o severe pain in the left upper extremity.  Her oxygen saturation dropped to 81% on room air.  Patient says she uses CPAP machine at home.  Objective:    Vitals:   07/05/19 2300 07/06/19 0034 07/06/19 0757 07/06/19 1458  BP: 117/73 (!) 141/83 105/68 (!) 143/72  Pulse: 74  70 (!) 101 79  Resp:  18 20   Temp:  97.6 F (36.4 C) 98.4 F (36.9 C)   TempSrc:  Oral Oral   SpO2: 94% 92% (!) 81%   Weight:      Height:       No data found.   Intake/Output Summary (Last 24 hours) at 07/06/2019 1542 Last data filed at 07/06/2019 1400 Gross per 24 hour  Intake 120 ml  Output 600 ml  Net -480 ml   Filed Weights   07/05/19 1452  Weight: 108.9 kg    Exam:  GEN: NAD SKIN: Warm and dry.  Sutured laceration on left frontal scalp  EYES: EOMI ENT: MMM CV: RRR PULM: CTA B ABD: soft, ND, NT, +BS CNS: AAO x 3, non focal EXT: Left arm in sling and dressed with bandage   Data  Reviewed:   I have personally reviewed following labs and imaging studies:  Labs: Labs show the following:   Basic Metabolic Panel: Recent Labs  Lab 07/05/19 1406  NA 140  K 3.4*  CL 101  CO2 30  GLUCOSE 113*  BUN 14  CREATININE 0.84  CALCIUM 8.6*   GFR Estimated Creatinine Clearance: 78.7 mL/min (by C-G formula based on SCr of 0.84 mg/dL). Liver Function Tests: Recent Labs  Lab 07/05/19 1406  AST 21  ALT 16  ALKPHOS 28*  BILITOT 1.0  PROT 6.3*  ALBUMIN 3.5   No results for input(s): LIPASE, AMYLASE in the last 168 hours. No results for input(s): AMMONIA in the last 168 hours. Coagulation profile Recent Labs  Lab 07/05/19 1406  INR 1.0    CBC: Recent Labs  Lab 07/05/19 1406  WBC 8.4  HGB 12.5  HCT 37.3  MCV 87.1  PLT 210   Cardiac Enzymes: No results for input(s): CKTOTAL, CKMB, CKMBINDEX, TROPONINI in the last 168 hours. BNP (last 3 results) No results for input(s): PROBNP in the last 8760 hours. CBG: No results for input(s): GLUCAP in the last 168 hours. D-Dimer: No results for input(s): DDIMER in the last 72 hours. Hgb A1c: No results for input(s): HGBA1C in the last 72 hours. Lipid Profile: No results for input(s): CHOL, HDL, LDLCALC, TRIG, CHOLHDL, LDLDIRECT in the last 72 hours. Thyroid function studies: No results for input(s): TSH, T4TOTAL, T3FREE, THYROIDAB in the last 72 hours.  Invalid input(s): FREET3 Anemia work up: No results for input(s): VITAMINB12, FOLATE, FERRITIN, TIBC, IRON, RETICCTPCT in the last 72 hours. Sepsis Labs: Recent Labs  Lab 07/05/19 1406  WBC 8.4    Microbiology Recent Results (from the past 240 hour(s))  SARS Coronavirus 2 by RT PCR (hospital order, performed in [facility] hospital lab) Nasopharyngeal Nasopharyngeal Swab     Status: None   Collection Time: 07/05/19  4:37 PM   Specimen: Nasopharyngeal Swab  Result Value Ref Range Status   SARS Coronavirus 2 NEGATIVE NEGATIVE Final    Comment:  (NOTE) SARS-CoV-2 target nucleic acids are NOT DETECTED. The SARS-CoV-2 RNA is generally detectable in upper and lower respiratory specimens during the acute phase of infection. The lowest concentration of SARS-CoV-2 viral copies this assay can detect is 250 copies / mL. A negative result does not preclude SARS-CoV-2 infection and should not be used as the sole basis for treatment or other patient management decisions.  A negative result may occur with improper specimen collection / handling, submission of specimen other than nasopharyngeal swab, presence of viral mutation(s) within the areas targeted by this assay, and inadequate  number of viral copies (<250 copies / mL). A negative result must be combined with clinical observations, patient history, and epidemiological information. Fact Sheet for Patients:   StrictlyIdeas.no Fact Sheet for Healthcare Providers: BankingDealers.co.za This test is not yet approved or cleared  by the [place] FDA and has been authorized for detection and/or diagnosis of SARS-CoV-2 by FDA under an Emergency Use Authorization (EUA).  This EUA will remain in effect (meaning this test can be used) for the duration of the COVID-19 declaration under Section 564(b)(1) of the Act, 21 U.S.C. section 360bbb-3(b)(1), unless the authorization is terminated or revoked sooner. Performed at [facility], [address] [place], [place] [address]     Procedures and diagnostic studies:  DG Forearm Left  Result Date: 07/05/2019 CLINICAL DATA:  Pain EXAM: LEFT FOREARM - 2 VIEW COMPARISON:  None. FINDINGS: There are acute impacted intra-articular fractures of both the distal radius and ulna. There is significant surrounding soft tissue swelling and volar angulation of the fracture fragments. There are multiple small well corticated osseous fragments projecting in the dorsal soft tissues at the level of the  wrist. These may be related to an old remote injury such as a prior triquetral fracture. There is an acute displaced fracture of the distal humerus. IMPRESSION: 1. Acute impacted intra-articular fractures of the distal radius and ulna with significant surrounding soft tissue swelling. 2. Acute displaced fracture of the distal humerus. Electronically Signed   By: Constance Holster M.D.   On: 07/05/2019 15:08   CT Head Wo Contrast  Result Date: 07/05/2019 CLINICAL DATA:  [place] downstairs, left scalp laceration EXAM: CT HEAD WITHOUT CONTRAST CT CERVICAL SPINE WITHOUT CONTRAST TECHNIQUE: Multidetector CT imaging of the head and cervical spine was performed following the standard protocol without intravenous contrast. Multiplanar CT image reconstructions of the cervical spine were also generated. COMPARISON:  04/26/2019 FINDINGS: CT HEAD FINDINGS Brain: No acute infarct or hemorrhage. Lateral ventricles and midline structures are unremarkable. No acute extra-axial fluid collections. No mass effect. Vascular: No hyperdense vessel or unexpected calcification. Skull: There is a left frontal scalp laceration. No underlying fracture. No destructive bony lesions. Sinuses/Orbits: No acute finding. Other: None. CT CERVICAL SPINE FINDINGS Alignment: Alignment is grossly anatomic. Skull base and vertebrae: No acute displaced fractures. Soft tissues and spinal canal: No prevertebral fluid or swelling. No visible canal hematoma. Disc levels: Multilevel cervical spondylosis is seen from C3/C4 through C6/C7. Circumferential disc osteophyte complex and bilateral uncovertebral hypertrophy contribute to mild symmetrical neural foraminal encroachment at C3/C4, C4/C5, and C5/C6. Upper chest: Upper lobe emphysematous changes are seen. The airway is patent. Other: Reconstructed images demonstrate no additional findings. IMPRESSION: 1. Left frontal scalp laceration. No underlying fracture. 2. No acute intracranial process. 3. Multilevel  cervical spondylosis. 4. No cervical spine fracture. Electronically Signed   By: Randa Ngo M.D.   On: 07/05/2019 16:24   CT Cervical Spine Wo Contrast  Result Date: 07/05/2019 CLINICAL DATA:  [place] [address] downstairs, left scalp laceration EXAM: CT HEAD WITHOUT CONTRAST CT CERVICAL SPINE WITHOUT CONTRAST TECHNIQUE: Multidetector CT imaging of the head and cervical spine was performed following the standard protocol without intravenous contrast. Multiplanar CT image reconstructions of the cervical spine were also generated. COMPARISON:  04/26/2019 FINDINGS: CT HEAD FINDINGS Brain: No acute infarct or hemorrhage. Lateral ventricles and midline structures are unremarkable. No acute extra-axial fluid collections. No mass effect. Vascular: No hyperdense vessel or unexpected calcification. Skull: There is a left frontal scalp laceration. No underlying fracture. No destructive  bony lesions. Sinuses/Orbits: No acute finding. Other: None. CT CERVICAL SPINE FINDINGS Alignment: Alignment is grossly anatomic. Skull base and vertebrae: No acute displaced fractures. Soft tissues and spinal canal: No prevertebral fluid or swelling. No visible canal hematoma. Disc levels: Multilevel cervical spondylosis is seen from C3/C4 through C6/C7. Circumferential disc osteophyte complex and bilateral uncovertebral hypertrophy contribute to mild symmetrical neural foraminal encroachment at C3/C4, C4/C5, and C5/C6. Upper chest: Upper lobe emphysematous changes are seen. The airway is patent. Other: Reconstructed images demonstrate no additional findings. IMPRESSION: 1. Left frontal scalp laceration. No underlying fracture. 2. No acute intracranial process. 3. Multilevel cervical spondylosis. 4. No cervical spine fracture. Electronically Signed   By: Randa Ngo M.D.   On: 07/05/2019 16:24   CT ELBOW LEFT WO CONTRAST  Result Date: 07/05/2019 CLINICAL DATA:  Fracture. EXAM: CT OF THE UPPER LEFT EXTREMITY WITHOUT CONTRAST 3-DIMENSIONAL CT  IMAGE RENDERING ON ACQUISITION WORKSTATION TECHNIQUE: Multidetector CT imaging of the upper left extremity was performed according to the standard protocol. 3-dimensional CT images were rendered by post-processing of the original CT data on an acquisition workstation. The 3-dimensional CT images were interpreted and findings were reported in the accompanying complete CT report for this study COMPARISON:  X-ray from same day FINDINGS: Bones/Joint/Cartilage Again noted is an acute, significantly displaced fracture of the distal humerus. There is significant overlap of the proximal and distal fracture fragments. Multiple small fracture fragments surround the fracture site. There is a questionable nondisplaced fracture involving the radial head (series 13, image 48). Ligaments Suboptimally assessed by CT. Muscles and Tendons The muscles are unremarkable by CT standards. Soft tissues There is soft tissue swelling about the elbow. There is an elbow joint effusion. IMPRESSION: 1. Acute, significantly displaced supracondylar fracture of the distal humerus. 2. Questionable nondisplaced fracture of the radial head, favored to represent artifact. Electronically Signed   By: Constance Holster M.D.   On: 07/05/2019 19:09   CT 3D RECON AT SCANNER  Result Date: 07/05/2019 CLINICAL DATA:  Fracture. EXAM: CT OF THE UPPER LEFT EXTREMITY WITHOUT CONTRAST 3-DIMENSIONAL CT IMAGE RENDERING ON ACQUISITION WORKSTATION TECHNIQUE: Multidetector CT imaging of the upper left extremity was performed according to the standard protocol. 3-dimensional CT images were rendered by post-processing of the original CT data on an acquisition workstation. The 3-dimensional CT images were interpreted and findings were reported in the accompanying complete CT report for this study COMPARISON:  X-ray from same day FINDINGS: Bones/Joint/Cartilage Again noted is an acute, significantly displaced fracture of the distal humerus. There is significant overlap  of the proximal and distal fracture fragments. Multiple small fracture fragments surround the fracture site. There is a questionable nondisplaced fracture involving the radial head (series 13, image 48). Ligaments Suboptimally assessed by CT. Muscles and Tendons The muscles are unremarkable by CT standards. Soft tissues There is soft tissue swelling about the elbow. There is an elbow joint effusion. IMPRESSION: 1. Acute, significantly displaced supracondylar fracture of the distal humerus. 2. Questionable nondisplaced fracture of the radial head, favored to represent artifact. Electronically Signed   By: Constance Holster M.D.   On: 07/05/2019 19:09   DG Chest Portable 1 View  Result Date: 07/05/2019 CLINICAL DATA:  Fall, hypoxia. EXAM: PORTABLE CHEST 1 VIEW COMPARISON:  None. FINDINGS: Lungs are somewhat hypoinflated with no lobar consolidation or effusion. Borderline cardiomegaly. Left shoulder arthroplasty intact. IMPRESSION: No active disease. Electronically Signed   By: Marin Olp M.D.   On: 07/05/2019 16:43   DG Humerus Left  Result  Date: 07/05/2019 CLINICAL DATA:  Pain EXAM: LEFT HUMERUS - 2+ VIEW COMPARISON:  None. FINDINGS: The patient is status post total shoulder arthroplasty. There is an acute, significantly displaced fracture of the distal humerus. There is extensive surrounding soft tissue swelling. There is no definite dislocation, however evaluation is limited by patient positioning. IMPRESSION: 1. Acute significantly displaced fracture of the distal humerus. 2. Status post total shoulder arthroplasty. Electronically Signed   By: Constance Holster M.D.   On: 07/05/2019 15:09    Medications:   . enoxaparin (LOVENOX) injection  40 mg Subcutaneous Q24H  . gabapentin  600 mg Oral TID   Continuous Infusions:   LOS: 0 days   Chameka Mcmullen  [facility]     07/06/2019, 3:42 PM

## 2019-07-06 NOTE — Progress Notes (Signed)
Subjective:  Patient continues complain of significant pain in the left upper extremity.  Left upper extremity remains splinted.  Patient denies any loss of feeling or mobility to her fingers.  Objective:   VITALS:   Vitals:   07/05/19 2130 07/05/19 2300 07/06/19 0034 07/06/19 0757  BP:  117/73 (!) 141/83 105/68  Pulse: 77 74 70 (!) 101  Resp:   18 20  Temp:   97.6 F (36.4 C) 98.4 F (36.9 C)  TempSrc:   Oral Oral  SpO2: 95% 94% 92% (!) 81%  Weight:      Height:        PHYSICAL EXAM: Left upper extremity: Compartments remain soft and compressible.  Patient can flex and extend all five digits of left hand.  Her fingers well-perfused.  She has intact sensation light touch in all five fingers. Posterior splint intact. Neurovascular intact Sensation intact distally  LABS  Results for orders placed or performed during the hospital encounter of 07/05/19 (from the past 24 hour(s))  SARS Coronavirus 2 by RT PCR (hospital order, performed in [facility] hospital lab) Nasopharyngeal Nasopharyngeal Swab     Status: None   Collection Time: 07/05/19  4:37 PM   Specimen: Nasopharyngeal Swab  Result Value Ref Range   SARS Coronavirus 2 NEGATIVE NEGATIVE  Blood gas, venous     Status: Abnormal   Collection Time: 07/05/19  4:49 PM  Result Value Ref Range   FIO2 60.00    Delivery systems HI FLOW NASAL CANNULA    pH, Ven 7.30 7.250 - 7.430   pCO2, Ven 24 (L) 44.0 - 60.0 mmHg   pO2, Ven 58.0 (H) 32.0 - 45.0 mmHg   Bicarbonate 11.8 (L) 20.0 - 28.0 mmol/L   Acid-base deficit 13.4 (H) 0.0 - 2.0 mmol/L   O2 Saturation 86.4 %   Patient temperature 37.0    Collection site VENOUS    Sample type VENOUS   HIV Antibody (routine testing w rflx)     Status: None   Collection Time: 07/06/19  4:35 AM  Result Value Ref Range   HIV Screen 4th Generation wRfx Non Reactive Non Reactive    DG Forearm Left  Result Date: 07/05/2019 CLINICAL DATA:  Pain EXAM: LEFT FOREARM - 2 VIEW COMPARISON:   None. FINDINGS: There are acute impacted intra-articular fractures of both the distal radius and ulna. There is significant surrounding soft tissue swelling and volar angulation of the fracture fragments. There are multiple small well corticated osseous fragments projecting in the dorsal soft tissues at the level of the wrist. These may be related to an old remote injury such as a prior triquetral fracture. There is an acute displaced fracture of the distal humerus. IMPRESSION: 1. Acute impacted intra-articular fractures of the distal radius and ulna with significant surrounding soft tissue swelling. 2. Acute displaced fracture of the distal humerus. Electronically Signed   By: Constance Holster M.D.   On: 07/05/2019 15:08   CT Head Wo Contrast  Result Date: 07/05/2019 CLINICAL DATA:  [place] downstairs, left scalp laceration EXAM: CT HEAD WITHOUT CONTRAST CT CERVICAL SPINE WITHOUT CONTRAST TECHNIQUE: Multidetector CT imaging of the head and cervical spine was performed following the standard protocol without intravenous contrast. Multiplanar CT image reconstructions of the cervical spine were also generated. COMPARISON:  04/26/2019 FINDINGS: CT HEAD FINDINGS Brain: No acute infarct or hemorrhage. Lateral ventricles and midline structures are unremarkable. No acute extra-axial fluid collections. No mass effect. Vascular: No hyperdense vessel or unexpected calcification. Skull: There  is a left frontal scalp laceration. No underlying fracture. No destructive bony lesions. Sinuses/Orbits: No acute finding. Other: None. CT CERVICAL SPINE FINDINGS Alignment: Alignment is grossly anatomic. Skull base and vertebrae: No acute displaced fractures. Soft tissues and spinal canal: No prevertebral fluid or swelling. No visible canal hematoma. Disc levels: Multilevel cervical spondylosis is seen from C3/C4 through C6/C7. Circumferential disc osteophyte complex and bilateral uncovertebral hypertrophy contribute to mild  symmetrical neural foraminal encroachment at C3/C4, C4/C5, and C5/C6. Upper chest: Upper lobe emphysematous changes are seen. The airway is patent. Other: Reconstructed images demonstrate no additional findings. IMPRESSION: 1. Left frontal scalp laceration. No underlying fracture. 2. No acute intracranial process. 3. Multilevel cervical spondylosis. 4. No cervical spine fracture. Electronically Signed   By: Randa Ngo M.D.   On: 07/05/2019 16:24   CT Cervical Spine Wo Contrast  Result Date: 07/05/2019 CLINICAL DATA:  [place] [address] downstairs, left scalp laceration EXAM: CT HEAD WITHOUT CONTRAST CT CERVICAL SPINE WITHOUT CONTRAST TECHNIQUE: Multidetector CT imaging of the head and cervical spine was performed following the standard protocol without intravenous contrast. Multiplanar CT image reconstructions of the cervical spine were also generated. COMPARISON:  04/26/2019 FINDINGS: CT HEAD FINDINGS Brain: No acute infarct or hemorrhage. Lateral ventricles and midline structures are unremarkable. No acute extra-axial fluid collections. No mass effect. Vascular: No hyperdense vessel or unexpected calcification. Skull: There is a left frontal scalp laceration. No underlying fracture. No destructive bony lesions. Sinuses/Orbits: No acute finding. Other: None. CT CERVICAL SPINE FINDINGS Alignment: Alignment is grossly anatomic. Skull base and vertebrae: No acute displaced fractures. Soft tissues and spinal canal: No prevertebral fluid or swelling. No visible canal hematoma. Disc levels: Multilevel cervical spondylosis is seen from C3/C4 through C6/C7. Circumferential disc osteophyte complex and bilateral uncovertebral hypertrophy contribute to mild symmetrical neural foraminal encroachment at C3/C4, C4/C5, and C5/C6. Upper chest: Upper lobe emphysematous changes are seen. The airway is patent. Other: Reconstructed images demonstrate no additional findings. IMPRESSION: 1. Left frontal scalp laceration. No underlying  fracture. 2. No acute intracranial process. 3. Multilevel cervical spondylosis. 4. No cervical spine fracture. Electronically Signed   By: Randa Ngo M.D.   On: 07/05/2019 16:24   CT ELBOW LEFT WO CONTRAST  Result Date: 07/05/2019 CLINICAL DATA:  Fracture. EXAM: CT OF THE UPPER LEFT EXTREMITY WITHOUT CONTRAST 3-DIMENSIONAL CT IMAGE RENDERING ON ACQUISITION WORKSTATION TECHNIQUE: Multidetector CT imaging of the upper left extremity was performed according to the standard protocol. 3-dimensional CT images were rendered by post-processing of the original CT data on an acquisition workstation. The 3-dimensional CT images were interpreted and findings were reported in the accompanying complete CT report for this study COMPARISON:  X-ray from same day FINDINGS: Bones/Joint/Cartilage Again noted is an acute, significantly displaced fracture of the distal humerus. There is significant overlap of the proximal and distal fracture fragments. Multiple small fracture fragments surround the fracture site. There is a questionable nondisplaced fracture involving the radial head (series 13, image 48). Ligaments Suboptimally assessed by CT. Muscles and Tendons The muscles are unremarkable by CT standards. Soft tissues There is soft tissue swelling about the elbow. There is an elbow joint effusion. IMPRESSION: 1. Acute, significantly displaced supracondylar fracture of the distal humerus. 2. Questionable nondisplaced fracture of the radial head, favored to represent artifact. Electronically Signed   By: Constance Holster M.D.   On: 07/05/2019 19:09   CT 3D RECON AT SCANNER  Result Date: 07/05/2019 CLINICAL DATA:  Fracture. EXAM: CT OF THE UPPER LEFT EXTREMITY WITHOUT CONTRAST 3-DIMENSIONAL  CT IMAGE RENDERING ON ACQUISITION WORKSTATION TECHNIQUE: Multidetector CT imaging of the upper left extremity was performed according to the standard protocol. 3-dimensional CT images were rendered by post-processing of the original CT  data on an acquisition workstation. The 3-dimensional CT images were interpreted and findings were reported in the accompanying complete CT report for this study COMPARISON:  X-ray from same day FINDINGS: Bones/Joint/Cartilage Again noted is an acute, significantly displaced fracture of the distal humerus. There is significant overlap of the proximal and distal fracture fragments. Multiple small fracture fragments surround the fracture site. There is a questionable nondisplaced fracture involving the radial head (series 13, image 48). Ligaments Suboptimally assessed by CT. Muscles and Tendons The muscles are unremarkable by CT standards. Soft tissues There is soft tissue swelling about the elbow. There is an elbow joint effusion. IMPRESSION: 1. Acute, significantly displaced supracondylar fracture of the distal humerus. 2. Questionable nondisplaced fracture of the radial head, favored to represent artifact. Electronically Signed   By: Constance Holster M.D.   On: 07/05/2019 19:09   DG Chest Portable 1 View  Result Date: 07/05/2019 CLINICAL DATA:  Fall, hypoxia. EXAM: PORTABLE CHEST 1 VIEW COMPARISON:  None. FINDINGS: Lungs are somewhat hypoinflated with no lobar consolidation or effusion. Borderline cardiomegaly. Left shoulder arthroplasty intact. IMPRESSION: No active disease. Electronically Signed   By: Marin Olp M.D.   On: 07/05/2019 16:43   DG Humerus Left  Result Date: 07/05/2019 CLINICAL DATA:  Pain EXAM: LEFT HUMERUS - 2+ VIEW COMPARISON:  None. FINDINGS: The patient is status post total shoulder arthroplasty. There is an acute, significantly displaced fracture of the distal humerus. There is extensive surrounding soft tissue swelling. There is no definite dislocation, however evaluation is limited by patient positioning. IMPRESSION: 1. Acute significantly displaced fracture of the distal humerus. 2. Status post total shoulder arthroplasty. Electronically Signed   By: Constance Holster M.D.    On: 07/05/2019 15:09    Assessment/Plan:     Principal Problem:   Acute respiratory failure with hypoxia [facility]) Active Problems:   Accidental fall   Peripheral neuropathy   Benign essential tremor   Obstructive sleep apnea   Fracture of distal end of left humerus   Closed fracture of distal end of left radius   Personal history of fall   Wrist fracture, closed, left, initial encounter   Scalp laceration, initial encounter   Fall down steps  Patient will continue splinting of the left upper extremity.  She should not weight-bear through the left upper extremity.  Patient will continue using a sling for support.  Patient will be seen by Dr. Peggye Ley at [facility] on Tuesday to discuss surgery.  Patient will undergo surgical fixation next week for her distal radius and humerus fractures.    Thornton Park , MD 07/06/2019, 2:53 PM

## 2019-07-06 NOTE — Evaluation (Addendum)
Physical Therapy Evaluation Patient Details Name: Annette Quinn MRN: [id number] DOB: 12-14-1950 Today's Date: 07/06/2019   History of Present Illness  Annette Quinn is a 22yoF who comes to [facility] on 5/27 after a fall. Pt was bending over to pick up soem packages at home and fell down 6 stairs. PMH: peripheral neuropathy, meralgia paresthetica, essential tremor, unsteadiness, falls. work-up revealed laceration to the left side of her head that was sutured, as well as displaced left distal humerus fracture and distal both bone forearm fracture. In 2018 pt sustained a fall up 2 stairs at home   Lt humeral fracture converted to a rTSA.  Clinical Impression  Pt admitted with above diagnosis. Pt currently with functional limitations due to the deficits listed below (see "PT Problem List"). Upon entry, pt in bed, awake and agreeable to participate. Pt on 2L/min, but room air trial results in SpO2 78-79%- 2-3L maintained for remainder of session. Pt reports 7/10 pain, but increased to 9/10 pain including lateral thoracic wall. The pt is alert and oriented x4, pleasant, conversational, and generally a good historian. Pt requires maxA for bed mobility, minA to come to standing. AMB not appropriate at this time due to dizziness and severe pain with mobility. Functional mobility assessment demonstrates increased effort/time requirements, poor tolerance, and need for physical assistance, whereas the patient performed these at a higher level of independence PTA. Pt will benefit from skilled PT intervention to increase independence and safety with basic mobility in preparation for discharge to the venue listed below.       Follow Up Recommendations SNF;Supervision for mobility/OOB    Equipment Recommendations  None recommended by PT(will need a W/C if unable to be ambulatory)    Recommendations for Other Services       Precautions / Restrictions Precautions Precautions: Fall Required Braces or Orthoses:  Sling;Splint/Cast Splint/Cast: LUE splint (donned at entry) total LUE Restrictions Weight Bearing Restrictions: Yes LUE Weight Bearing: Non weight bearing(assumed)      Mobility  Bed Mobility Overal bed mobility: Needs Assistance Bed Mobility: Supine to Sit     Supine to sit: Max assist;HOB elevated     General bed mobility comments: PT attempted with RUE pulling on therapist's hand, but has severe, limiting Left lateral rib wall pain. Author provides modA support at mid thoracic spine (trunk flexion moment)(Dizziness upon sitting, no correlation with HR or SpO2.)  Transfers Overall transfer level: Needs assistance Equipment used: 1 person hand held assist Transfers: Sit to/from [facility] Sit to Stand: Min assist Stand pivot transfers: Min assist       General transfer comment: Pt not confident to attempt with SPC at this point; holds onto author's hand, takes small somewhat steady steps to recliner with large flop into chair.  Ambulation/Gait Ambulation/Gait assistance: (deferred until pt has better pain control.)              Stairs            Wheelchair Mobility    Modified Rankin (Stroke Patients Only)       Balance Overall balance assessment: Needs assistance;History of Falls Sitting-balance support: No upper extremity supported;Feet supported;Feet unsupported Sitting balance-Leahy Scale: Good     Standing balance support: Single extremity supported;During functional activity Standing balance-Leahy Scale: Poor                               Pertinent Vitals/Pain Pain Assessment: 0-10 Pain Score: 7  Pain Location: Left lower arm    Home Living Family/patient expects to be discharged to:: Private residence Living Arrangements: Spouse/significant other;Children(28yo Son has a BI could help with simple commands) Available Help at Discharge: Family;Available 24 hours/day Type of Home: House Home Access: Stairs to  enter Entrance Stairs-Rails: Psychiatric nurse of Steps: 4 Home Layout: One level Home Equipment: Walker - 4 wheels;Walker - standard;Shower seat;Grab bars - tub/shower      Prior Function Level of Independence: Independent with assistive device(s)         Comments: SPC for most AMB due to unsteadiness; PMH: fall up stairs  with Left humeral fracture 2018 (converted to Left reverse TSA)     Hand Dominance   Dominant Hand: Right    Extremity/Trunk Assessment   Upper Extremity Assessment Upper Extremity Assessment: Generalized weakness;LUE deficits/detail;Overall WFL for tasks assessed LUE: Unable to fully assess due to pain;Unable to fully assess due to immobilization    Lower Extremity Assessment Lower Extremity Assessment: Generalized weakness       Communication   Communication: No difficulties  Cognition Arousal/Alertness: Awake/alert Behavior During Therapy: WFL for tasks assessed/performed Overall Cognitive Status: Within Functional Limits for tasks assessed                                        General Comments      Exercises     Assessment/Plan    PT Assessment Patient needs continued PT services  PT Problem List Decreased strength;Decreased range of motion;Decreased activity tolerance;Decreased balance;Decreased mobility;Cardiopulmonary status limiting activity       PT Treatment Interventions Balance training;DME instruction;Gait training;Stair training;Functional mobility training;Therapeutic activities;Patient/family education    PT Goals (Current goals can be found in the Care Plan section)  Acute Rehab PT Goals Patient Stated Goal: improve pain control PT Goal Formulation: With patient Time For Goal Achievement: 07/20/19 Potential to Achieve Goals: Good    Frequency 7X/week   Barriers to discharge Inaccessible home environment multiple steps to enter    Co-evaluation               AM-PAC PT "6  Clicks" Mobility  Outcome Measure Help needed turning from your back to your side while in a flat bed without using bedrails?: Total Help needed moving from lying on your back to sitting on the side of a flat bed without using bedrails?: Total Help needed moving to and from a bed to a chair (including a wheelchair)?: A Lot Help needed standing up from a chair using your arms (e.g., wheelchair or bedside chair)?: A Lot Help needed to walk in hospital room?: Total Help needed climbing 3-5 steps with a railing? : Total 6 Click Score: 8    End of Session Equipment Utilized During Treatment: Oxygen Activity Tolerance: Treatment limited secondary to medical complications (Comment);Patient limited by pain(fluctuating adn concerning dizziness) Patient left: in chair;with call bell/phone within reach Nurse Communication: Mobility status PT Visit Diagnosis: Difficulty in walking, not elsewhere classified (R26.2);Other abnormalities of gait and mobility (R26.89);Dizziness and giddiness (R42)    Time: 2952-8413 PT Time Calculation (min) (ACUTE ONLY): 46 min   Charges:   PT Evaluation $PT Eval Moderate Complexity: 1 Mod PT Treatments $Therapeutic Exercise: 8-22 mins        4:20 PM, 07/06/19 Etta Grandchild, PT, DPT Physical Therapist - [facility]  [phone] [facility])    [place]  C 07/06/2019, 4:18 PM

## 2019-07-07 LAB — URINALYSIS, COMPLETE (UACMP) WITH MICROSCOPIC
Bilirubin Urine: NEGATIVE
Glucose, UA: NEGATIVE mg/dL
Hgb urine dipstick: NEGATIVE
Ketones, ur: NEGATIVE mg/dL
Nitrite: NEGATIVE
Protein, ur: NEGATIVE mg/dL
Specific Gravity, Urine: 1.026 (ref 1.005–1.030)
pH: 5 (ref 5.0–8.0)

## 2019-07-07 MED ORDER — PANTOPRAZOLE SODIUM 40 MG PO TBEC
40.0000 mg | DELAYED_RELEASE_TABLET | ORAL | Status: DC
  Administered 2019-07-07: 40 mg via ORAL
  Filled 2019-07-07: qty 1

## 2019-07-07 MED ORDER — AZELASTINE HCL 0.1 % NA SOLN
1.0000 | Freq: Two times a day (BID) | NASAL | Status: DC | PRN
  Filled 2019-07-07: qty 30

## 2019-07-07 MED ORDER — PROPRANOLOL HCL 20 MG PO TABS
20.0000 mg | ORAL_TABLET | Freq: Two times a day (BID) | ORAL | Status: DC
  Administered 2019-07-07 – 2019-07-08 (×4): 20 mg via ORAL
  Filled 2019-07-07 (×5): qty 1

## 2019-07-07 MED ORDER — SERTRALINE HCL 50 MG PO TABS
200.0000 mg | ORAL_TABLET | Freq: Every day | ORAL | Status: DC
  Administered 2019-07-07 – 2019-07-09 (×3): 200 mg via ORAL
  Filled 2019-07-07 (×3): qty 4

## 2019-07-07 MED ORDER — VITAMIN D 25 MCG (1000 UNIT) PO TABS
2000.0000 [IU] | ORAL_TABLET | Freq: Every day | ORAL | Status: DC
  Administered 2019-07-08 – 2019-07-09 (×2): 2000 [IU] via ORAL
  Filled 2019-07-07 (×2): qty 2

## 2019-07-07 MED ORDER — OXYCODONE HCL 5 MG PO TABS
5.0000 mg | ORAL_TABLET | ORAL | Status: DC | PRN
  Administered 2019-07-08 – 2019-07-09 (×3): 10 mg via ORAL
  Filled 2019-07-07 (×3): qty 2

## 2019-07-07 MED ORDER — RALOXIFENE HCL 60 MG PO TABS
60.0000 mg | ORAL_TABLET | Freq: Every day | ORAL | Status: DC
  Administered 2019-07-07 – 2019-07-09 (×3): 60 mg via ORAL
  Filled 2019-07-07 (×3): qty 1

## 2019-07-07 MED ORDER — AZELASTINE-FLUTICASONE 137-50 MCG/ACT NA SUSP: 1.0000 | Freq: Two times a day (BID) | NASAL | Status: DC | PRN

## 2019-07-07 MED ORDER — BUMETANIDE 1 MG PO TABS
1.0000 mg | ORAL_TABLET | Freq: Every day | ORAL | Status: DC
  Administered 2019-07-07 – 2019-07-08 (×2): 1 mg via ORAL
  Filled 2019-07-07 (×2): qty 1

## 2019-07-07 MED ORDER — HYDROMORPHONE HCL 1 MG/ML IJ SOLN
1.0000 mg | INTRAMUSCULAR | Status: DC | PRN
  Administered 2019-07-07 – 2019-07-08 (×2): 1 mg via INTRAVENOUS
  Filled 2019-07-07 (×2): qty 1

## 2019-07-07 MED ORDER — TIZANIDINE HCL 4 MG PO TABS
4.0000 mg | ORAL_TABLET | Freq: Two times a day (BID) | ORAL | Status: DC
  Administered 2019-07-07 – 2019-07-09 (×5): 4 mg via ORAL
  Filled 2019-07-07 (×6): qty 1

## 2019-07-07 MED ORDER — BUSPIRONE HCL 15 MG PO TABS
7.5000 mg | ORAL_TABLET | Freq: Two times a day (BID) | ORAL | Status: DC
  Administered 2019-07-07 – 2019-07-09 (×5): 7.5 mg via ORAL
  Filled 2019-07-07 (×6): qty 1

## 2019-07-07 MED ORDER — FENOFIBRATE 160 MG PO TABS
160.0000 mg | ORAL_TABLET | Freq: Every day | ORAL | Status: DC
  Administered 2019-07-07 – 2019-07-09 (×3): 160 mg via ORAL
  Filled 2019-07-07 (×3): qty 1

## 2019-07-07 MED ORDER — MONTELUKAST SODIUM 10 MG PO TABS
10.0000 mg | ORAL_TABLET | Freq: Every morning | ORAL | Status: DC
  Administered 2019-07-07 – 2019-07-09 (×3): 10 mg via ORAL
  Filled 2019-07-07 (×3): qty 1

## 2019-07-07 MED ORDER — FLUTICASONE PROPIONATE 50 MCG/ACT NA SUSP
1.0000 | Freq: Two times a day (BID) | NASAL | Status: DC | PRN
  Filled 2019-07-07: qty 16

## 2019-07-07 NOTE — Progress Notes (Signed)
Pt did not void during shift. Bladder scan volume was 523ml. Hospitalist made aware and new order of in and out cath acknowledged. Procedure done and 642ml was removed from bladder. Pt also report she takes water pill at home and she is not getting her normal home medication. Will pass on to oncoming nurse to ask for pt meds be reconciled.

## 2019-07-07 NOTE — Progress Notes (Signed)
Physical Therapy Treatment Patient Details Name: Annette Quinn MRN: [id number] DOB: 1950-11-22 Today's Date: 07/07/2019    History of Present Illness Annette Quinn is a 30yoF who [facility] after a fall. Pt was bending over to pick up soem packages at home and fell down 6 stairs. PMH: peripheral neuropathy, meralgia paresthetica, essential tremor, unsteadiness, falls. work-up revealed laceration to the left side of her head that was sutured, as well as displaced left distal humerus fracture and distal both bone forearm fracture. In 2018 pt sustained a fall up 2 stairs at home   Lt humeral fracture converted to a rTSA.    PT Comments    Patient is in bed upon PT arrival with husband present. Is agreeable to therapy session declaring she is in pain but she will try. Patient performed supine ankle pumps and then transitioned to sitting EOB with assistance as well as verbal cueing for breathing for pain control. Seated exercises performed once dizziness receded with cued breathing. Sp02 remained within therapeutic ranges throughout session. Return to bed required Max A due to pain and fear of pain. Repositioning in bed performed with patient and patient's husband educated on pillow placement. Current POC remains appropriate.     Follow Up Recommendations  SNF;Supervision for mobility/OOB     Equipment Recommendations  None recommended by PT(will need a W/C if unable to be ambulatory)    Recommendations for Other Services       Precautions / Restrictions Precautions Precautions: Fall Required Braces or Orthoses: Sling;Splint/Cast Splint/Cast: LUE splint (donned at entry) total LUE Restrictions Weight Bearing Restrictions: Yes LUE Weight Bearing: Non weight bearing(assumed d/t nature of splint and sling)    Mobility  Bed Mobility Overal bed mobility: Needs Assistance Bed Mobility: Supine to Sit;Sit to Supine     Supine to sit: Mod assist;HOB elevated Sit to supine: Max  assist;HOB elevated   General bed mobility comments: supine to sit requires Mod A for LE's and positioning due to pain, Sit to supine max A for LE's and positioning  Transfers                 General transfer comment: patient declined due to pain  Ambulation/Gait                 Stairs             Wheelchair Mobility    Modified Rankin (Stroke Patients Only)       Balance                                            Cognition Arousal/Alertness: Awake/alert Behavior During Therapy: WFL for tasks assessed/performed Overall Cognitive Status: Within Functional Limits for tasks assessed                                        Exercises General Exercises - Lower Extremity Ankle Circles/Pumps: Strengthening;AROM;Both;10 reps;Supine Gluteal Sets: Strengthening;Both;15 reps;Seated Long Arc Quad: Strengthening;Both;15 reps;Seated Hip Flexion/Marching: Strengthening;Both;15 reps;Seated Toe Raises: Strengthening;Both;15 reps;Seated Heel Raises: Strengthening;Both;15 reps;Seated Other Exercises Other Exercises: seated EOB stability and core x 9 minutes    General Comments        Pertinent Vitals/Pain Pain Assessment: 0-10 Pain Score: 6 (went to 9/10 with sitting) Pain Location: Left lower  arm Pain Descriptors / Indicators: Aching;Grimacing Pain Intervention(s): Limited activity within patient's tolerance;Monitored during session;Repositioned    Home Living                      Prior Function            PT Goals (current goals can now be found in the care plan section) Acute Rehab PT Goals Patient Stated Goal: prevent future falls and injuries Progress towards PT goals: Progressing toward goals    Frequency    7X/week      PT Plan Current plan remains appropriate    Co-evaluation              AM-PAC PT "6 Clicks" Mobility   Outcome Measure  Help needed turning from your back to your side  while in a flat bed without using bedrails?: Total Help needed moving from lying on your back to sitting on the side of a flat bed without using bedrails?: Total Help needed moving to and from a bed to a chair (including a wheelchair)?: A Lot Help needed standing up from a chair using your arms (e.g., wheelchair or bedside chair)?: A Lot Help needed to walk in hospital room?: Total Help needed climbing 3-5 steps with a railing? : Total 6 Click Score: 8    End of Session Equipment Utilized During Treatment: Oxygen Activity Tolerance: Treatment limited secondary to medical complications (Comment);Patient limited by pain(fluctuating adn concerning dizziness) Patient left: with call bell/phone within reach;in bed;with bed alarm set Nurse Communication: Mobility status PT Visit Diagnosis: Difficulty in walking, not elsewhere classified (R26.2);Other abnormalities of gait and mobility (R26.89);Dizziness and giddiness (R42)     Time: 9892-1194 PT Time Calculation (min) (ACUTE ONLY): 21 min  Charges:  $Therapeutic Exercise: 8-22 mins                     Janna Arch, PT, DPT   07/07/2019, 3:59 PM

## 2019-07-07 NOTE — Progress Notes (Signed)
Progress Note    Annette Quinn  ATF:[id number] DOB: 11-Aug-1950  DOA: 07/05/2019 PCP: Kirk Ruths, MD      Brief Narrative:    Medical records reviewed and are as summarized below:  Annette Quinn is an 69 y.o. female with medical history significant for peripheral neuropathy and meralgia paresthetica, essential tremor, OSA on CPAP, followed by neurology with history of imbalance and history of a prior fall who presented to the emergency room on 07/05/2019 following a fall.  Patient said she bent over to pick up some [employer] boxes that were left on her stairs and she fell forward, going down 6 stairs. She fell onto the left side sustaining injury to the left upper extremity and left frontal scalp.       Assessment/Plan:   Principal Problem:   Acute respiratory failure with hypoxia (HCC) Active Problems:   Accidental fall   Peripheral neuropathy   Benign essential tremor   Obstructive sleep apnea   Fracture of distal end of left humerus   Closed fracture of distal end of left radius   Personal history of fall   Wrist fracture, closed, left, initial encounter   Scalp laceration, initial encounter   Fall down steps   Fracture of distal humerus  Acute hypoxemic respiratory failure, probable underlying chronic hypoxemic respiratory failure -Continue oxygen via nasal cannula.  Taper off as able.  Patient will likely need home oxygen.  Continue to monitor She has a 43-pack-year smoking history and is at very high risk for COPD.  COPD may be the underlying reason for hypoxemia.  Follow-up with pulmonologist for pulmonary function test was recommended.  Accidental fall down steps/displaced fracture of distal end of left humerus, fracture of distal end of left radius -She was evaluated by orthopedist Dr. Mack Guise who recommended outpatient follow-up  with Dr. Peggye Ley, hand specialist.  She will see Dr. Mack Guise for follow-up on Tuesday, July 10, 2019. Change IV morphine to IV  Dilaudid for adequate pain control.     Personal history of fall   Peripheral neuropathy   Benign essential tremor -Outpatient follow-up with neurology  Laceration left scalp -Sutured in the emergency room -Keep wound clean. --  Arrange for suture removal at discharge    Obstructive sleep apnea -CPAP at night  Urinary retention She said she has a history of urinary retention.  Intermittent ureteral catheterization as needed.    Body mass index is 39.95 kg/m.  (Morbid obesity): This complicates overall prognosis and care   Family Communication/Anticipated D/C date and plan/Code Status   DVT prophylaxis: Lovenox Code Status: Full code Family Communication: Plan discussed with her husband Disposition Plan:    Status is: Observation  The patient will require care spanning > 2 midnights and should be moved to inpatient because: Ongoing active pain requiring inpatient pain management and IV treatments appropriate due to intensity of illness or inability to take PO  Dispo: The patient is from: Home              Anticipated d/c is to: Home              Anticipated d/c date is: 1 day              Patient currently is not medically stable to d/c.           Subjective:    C/o severe pain in the left upper extremity.  Pain is graded as 8/10 in severity.  With  IV morphine pain only goes down to 7/10 in severity.  She said she felt dizzy yesterday while PT got her up yesterday without oxygen.  She felt better after oxygen was replaced.  No cough, shortness of breath or chest pain.   Objective:    Vitals:   07/06/19 1458 07/06/19 1658 07/06/19 2356 07/07/19 0823  BP: (!) 143/72 (!) 125/52 120/63 131/66  Pulse: 79 76 70 72  Resp:  18 15 16   Temp:  98.1 F (36.7 C) 98.3 F (36.8 C) 97.9 F (36.6 C)  TempSrc:   Oral Oral  SpO2:  96% 97% 97%  Weight:      Height:       No data found.   Intake/Output Summary (Last 24 hours) at 07/07/2019 1132 Last data filed  at 07/07/2019 9163 Gross per 24 hour  Intake 240 ml  Output 1210 ml  Net -970 ml   Filed Weights   07/05/19 1452  Weight: 108.9 kg    Exam:  GEN: NAD SKIN: Warm and dry.  Sutured laceration on left frontal scalp  EYES: EOMI ENT: MMM CV: RRR PULM: CTA B ABD: soft, ND, NT, +BS CNS: AAO x 3, non focal EXT: Left arm in sling and dressed with bandage   Data Reviewed:   I have personally reviewed following labs and imaging studies:  Labs: Labs show the following:   Basic Metabolic Panel: Recent Labs  Lab 07/05/19 1406  NA 140  K 3.4*  CL 101  CO2 30  GLUCOSE 113*  BUN 14  CREATININE 0.84  CALCIUM 8.6*   GFR Estimated Creatinine Clearance: 78.7 mL/min (by C-G formula based on SCr of 0.84 mg/dL). Liver Function Tests: Recent Labs  Lab 07/05/19 1406  AST 21  ALT 16  ALKPHOS 28*  BILITOT 1.0  PROT 6.3*  ALBUMIN 3.5   No results for input(s): LIPASE, AMYLASE in the last 168 hours. No results for input(s): AMMONIA in the last 168 hours. Coagulation profile Recent Labs  Lab 07/05/19 1406  INR 1.0    CBC: Recent Labs  Lab 07/05/19 1406  WBC 8.4  HGB 12.5  HCT 37.3  MCV 87.1  PLT 210   Cardiac Enzymes: No results for input(s): CKTOTAL, CKMB, CKMBINDEX, TROPONINI in the last 168 hours. BNP (last 3 results) No results for input(s): PROBNP in the last 8760 hours. CBG: No results for input(s): GLUCAP in the last 168 hours. D-Dimer: No results for input(s): DDIMER in the last 72 hours. Hgb A1c: No results for input(s): HGBA1C in the last 72 hours. Lipid Profile: No results for input(s): CHOL, HDL, LDLCALC, TRIG, CHOLHDL, LDLDIRECT in the last 72 hours. Thyroid function studies: No results for input(s): TSH, T4TOTAL, T3FREE, THYROIDAB in the last 72 hours.  Invalid input(s): FREET3 Anemia work up: No results for input(s): VITAMINB12, FOLATE, FERRITIN, TIBC, IRON, RETICCTPCT in the last 72 hours. Sepsis Labs: Recent Labs  Lab 07/05/19 1406    WBC 8.4    Microbiology Recent Results (from the past 240 hour(s))  SARS Coronavirus 2 by RT PCR (hospital order, performed in [facility] hospital lab) Nasopharyngeal Nasopharyngeal Swab     Status: None   Collection Time: 07/05/19  4:37 PM   Specimen: Nasopharyngeal Swab  Result Value Ref Range Status   SARS Coronavirus 2 NEGATIVE NEGATIVE Final    Comment: (NOTE) SARS-CoV-2 target nucleic acids are NOT DETECTED. The SARS-CoV-2 RNA is generally detectable in upper and lower respiratory specimens during the acute phase of infection.  The lowest concentration of SARS-CoV-2 viral copies this assay can detect is 250 copies / mL. A negative result does not preclude SARS-CoV-2 infection and should not be used as the sole basis for treatment or other patient management decisions.  A negative result may occur with improper specimen collection / handling, submission of specimen other than nasopharyngeal swab, presence of viral mutation(s) within the areas targeted by this assay, and inadequate number of viral copies (<250 copies / mL). A negative result must be combined with clinical observations, patient history, and epidemiological information. Fact Sheet for Patients:   StrictlyIdeas.no Fact Sheet for Healthcare Providers: BankingDealers.co.za This test is not yet approved or cleared  by the [place] FDA and has been authorized for detection and/or diagnosis of SARS-CoV-2 by FDA under an Emergency Use Authorization (EUA).  This EUA will remain in effect (meaning this test can be used) for the duration of the COVID-19 declaration under Section 564(b)(1) of the Act, 21 U.S.C. section 360bbb-3(b)(1), unless the authorization is terminated or revoked sooner. Performed at [facility], [address] [place], [place] [address]     Procedures and diagnostic studies:  DG Forearm Left  Result Date: 07/05/2019 CLINICAL DATA:   Pain EXAM: LEFT FOREARM - 2 VIEW COMPARISON:  None. FINDINGS: There are acute impacted intra-articular fractures of both the distal radius and ulna. There is significant surrounding soft tissue swelling and volar angulation of the fracture fragments. There are multiple small well corticated osseous fragments projecting in the dorsal soft tissues at the level of the wrist. These may be related to an old remote injury such as a prior triquetral fracture. There is an acute displaced fracture of the distal humerus. IMPRESSION: 1. Acute impacted intra-articular fractures of the distal radius and ulna with significant surrounding soft tissue swelling. 2. Acute displaced fracture of the distal humerus. Electronically Signed   By: Constance Holster M.D.   On: 07/05/2019 15:08   CT Head Wo Contrast  Result Date: 07/05/2019 CLINICAL DATA:  [place] [address] downstairs, left scalp laceration EXAM: CT HEAD WITHOUT CONTRAST CT CERVICAL SPINE WITHOUT CONTRAST TECHNIQUE: Multidetector CT imaging of the head and cervical spine was performed following the standard protocol without intravenous contrast. Multiplanar CT image reconstructions of the cervical spine were also generated. COMPARISON:  04/26/2019 FINDINGS: CT HEAD FINDINGS Brain: No acute infarct or hemorrhage. Lateral ventricles and midline structures are unremarkable. No acute extra-axial fluid collections. No mass effect. Vascular: No hyperdense vessel or unexpected calcification. Skull: There is a left frontal scalp laceration. No underlying fracture. No destructive bony lesions. Sinuses/Orbits: No acute finding. Other: None. CT CERVICAL SPINE FINDINGS Alignment: Alignment is grossly anatomic. Skull base and vertebrae: No acute displaced fractures. Soft tissues and spinal canal: No prevertebral fluid or swelling. No visible canal hematoma. Disc levels: Multilevel cervical spondylosis is seen from C3/C4 through C6/C7. Circumferential disc osteophyte complex and bilateral  uncovertebral hypertrophy contribute to mild symmetrical neural foraminal encroachment at C3/C4, C4/C5, and C5/C6. Upper chest: Upper lobe emphysematous changes are seen. The airway is patent. Other: Reconstructed images demonstrate no additional findings. IMPRESSION: 1. Left frontal scalp laceration. No underlying fracture. 2. No acute intracranial process. 3. Multilevel cervical spondylosis. 4. No cervical spine fracture. Electronically Signed   By: Randa Ngo M.D.   On: 07/05/2019 16:24   CT Cervical Spine Wo Contrast  Result Date: 07/05/2019 CLINICAL DATA:  [place] [address] downstairs, left scalp laceration EXAM: CT HEAD WITHOUT CONTRAST CT CERVICAL SPINE WITHOUT CONTRAST TECHNIQUE: Multidetector CT imaging of the  head and cervical spine was performed following the standard protocol without intravenous contrast. Multiplanar CT image reconstructions of the cervical spine were also generated. COMPARISON:  04/26/2019 FINDINGS: CT HEAD FINDINGS Brain: No acute infarct or hemorrhage. Lateral ventricles and midline structures are unremarkable. No acute extra-axial fluid collections. No mass effect. Vascular: No hyperdense vessel or unexpected calcification. Skull: There is a left frontal scalp laceration. No underlying fracture. No destructive bony lesions. Sinuses/Orbits: No acute finding. Other: None. CT CERVICAL SPINE FINDINGS Alignment: Alignment is grossly anatomic. Skull base and vertebrae: No acute displaced fractures. Soft tissues and spinal canal: No prevertebral fluid or swelling. No visible canal hematoma. Disc levels: Multilevel cervical spondylosis is seen from C3/C4 through C6/C7. Circumferential disc osteophyte complex and bilateral uncovertebral hypertrophy contribute to mild symmetrical neural foraminal encroachment at C3/C4, C4/C5, and C5/C6. Upper chest: Upper lobe emphysematous changes are seen. The airway is patent. Other: Reconstructed images demonstrate no additional findings. IMPRESSION: 1. Left  frontal scalp laceration. No underlying fracture. 2. No acute intracranial process. 3. Multilevel cervical spondylosis. 4. No cervical spine fracture. Electronically Signed   By: Randa Ngo M.D.   On: 07/05/2019 16:24   CT ELBOW LEFT WO CONTRAST  Result Date: 07/05/2019 CLINICAL DATA:  Fracture. EXAM: CT OF THE UPPER LEFT EXTREMITY WITHOUT CONTRAST 3-DIMENSIONAL CT IMAGE RENDERING ON ACQUISITION WORKSTATION TECHNIQUE: Multidetector CT imaging of the upper left extremity was performed according to the standard protocol. 3-dimensional CT images were rendered by post-processing of the original CT data on an acquisition workstation. The 3-dimensional CT images were interpreted and findings were reported in the accompanying complete CT report for this study COMPARISON:  X-ray from same day FINDINGS: Bones/Joint/Cartilage Again noted is an acute, significantly displaced fracture of the distal humerus. There is significant overlap of the proximal and distal fracture fragments. Multiple small fracture fragments surround the fracture site. There is a questionable nondisplaced fracture involving the radial head (series 13, image 48). Ligaments Suboptimally assessed by CT. Muscles and Tendons The muscles are unremarkable by CT standards. Soft tissues There is soft tissue swelling about the elbow. There is an elbow joint effusion. IMPRESSION: 1. Acute, significantly displaced supracondylar fracture of the distal humerus. 2. Questionable nondisplaced fracture of the radial head, favored to represent artifact. Electronically Signed   By: Constance Holster M.D.   On: 07/05/2019 19:09   CT 3D RECON AT SCANNER  Result Date: 07/05/2019 CLINICAL DATA:  Fracture. EXAM: CT OF THE UPPER LEFT EXTREMITY WITHOUT CONTRAST 3-DIMENSIONAL CT IMAGE RENDERING ON ACQUISITION WORKSTATION TECHNIQUE: Multidetector CT imaging of the upper left extremity was performed according to the standard protocol. 3-dimensional CT images were  rendered by post-processing of the original CT data on an acquisition workstation. The 3-dimensional CT images were interpreted and findings were reported in the accompanying complete CT report for this study COMPARISON:  X-ray from same day FINDINGS: Bones/Joint/Cartilage Again noted is an acute, significantly displaced fracture of the distal humerus. There is significant overlap of the proximal and distal fracture fragments. Multiple small fracture fragments surround the fracture site. There is a questionable nondisplaced fracture involving the radial head (series 13, image 48). Ligaments Suboptimally assessed by CT. Muscles and Tendons The muscles are unremarkable by CT standards. Soft tissues There is soft tissue swelling about the elbow. There is an elbow joint effusion. IMPRESSION: 1. Acute, significantly displaced supracondylar fracture of the distal humerus. 2. Questionable nondisplaced fracture of the radial head, favored to represent artifact. Electronically Signed   By: Constance Holster  M.D.   On: 07/05/2019 19:09   DG Chest Portable 1 View  Result Date: 07/05/2019 CLINICAL DATA:  Fall, hypoxia. EXAM: PORTABLE CHEST 1 VIEW COMPARISON:  None. FINDINGS: Lungs are somewhat hypoinflated with no lobar consolidation or effusion. Borderline cardiomegaly. Left shoulder arthroplasty intact. IMPRESSION: No active disease. Electronically Signed   By: Marin Olp M.D.   On: 07/05/2019 16:43   DG Humerus Left  Result Date: 07/05/2019 CLINICAL DATA:  Pain EXAM: LEFT HUMERUS - 2+ VIEW COMPARISON:  None. FINDINGS: The patient is status post total shoulder arthroplasty. There is an acute, significantly displaced fracture of the distal humerus. There is extensive surrounding soft tissue swelling. There is no definite dislocation, however evaluation is limited by patient positioning. IMPRESSION: 1. Acute significantly displaced fracture of the distal humerus. 2. Status post total shoulder arthroplasty.  Electronically Signed   By: Constance Holster M.D.   On: 07/05/2019 15:09    Medications:   . bumetanide  1 mg Oral Daily  . busPIRone  7.5 mg Oral BID  . [START ON 07/08/2019] Cholecalciferol  2,000 Units Oral QAC breakfast  . enoxaparin (LOVENOX) injection  40 mg Subcutaneous Q24H  . fenofibrate  160 mg Oral Q1200  . gabapentin  600 mg Oral TID  . montelukast  10 mg Oral q morning - 10a  . pantoprazole  40 mg Oral See admin instructions  . propranolol  20 mg Oral BID  . raloxifene  60 mg Oral Q1200  . sertraline  200 mg Oral Q lunch  . tiZANidine  4 mg Oral BID   Continuous Infusions:   LOS: 1 day   Kajal Scalici  [facility]     07/07/2019, 11:32 AM

## 2019-07-08 DIAGNOSIS — S52502A Unspecified fracture of the lower end of left radius, initial encounter for closed fracture: Secondary | ICD-10-CM

## 2019-07-08 LAB — BASIC METABOLIC PANEL
Anion gap: 7 (ref 5–15)
BUN: 14 mg/dL (ref 8–23)
CO2: 34 mmol/L — ABNORMAL HIGH (ref 22–32)
Calcium: 8.7 mg/dL — ABNORMAL LOW (ref 8.9–10.3)
Chloride: 97 mmol/L — ABNORMAL LOW (ref 98–111)
Creatinine, Ser: 0.8 mg/dL (ref 0.44–1.00)
GFR calc Af Amer: >60 mL/min (ref >60)
GFR calc non Af Amer: >60 mL/min (ref >60)
Glucose, Bld: 113 mg/dL — ABNORMAL HIGH (ref 70–99)
Potassium: 4 mmol/L (ref 3.5–5.1)
Sodium: 138 mmol/L (ref 135–145)

## 2019-07-08 LAB — CBC WITH DIFFERENTIAL/PLATELET
Abs Immature Granulocytes: 0.04 10*3/uL (ref 0.00–0.07)
Basophils Absolute: 0.1 10*3/uL (ref 0.0–0.1)
Basophils Relative: 1 %
Eosinophils Absolute: 0.1 10*3/uL (ref 0.0–0.5)
Eosinophils Relative: 2 %
HCT: 32.9 % — ABNORMAL LOW (ref 36.0–46.0)
Hemoglobin: 11.1 g/dL — ABNORMAL LOW (ref 12.0–15.0)
Immature Granulocytes: 1 %
Lymphocytes Relative: 12 %
Lymphs Abs: 0.9 10*3/uL (ref 0.7–4.0)
MCH: 29.1 pg (ref 26.0–34.0)
MCHC: 33.7 g/dL (ref 30.0–36.0)
MCV: 86.4 fL (ref 80.0–100.0)
Monocytes Absolute: 0.8 10*3/uL (ref 0.1–1.0)
Monocytes Relative: 11 %
Neutro Abs: 5.5 10*3/uL (ref 1.7–7.7)
Neutrophils Relative %: 73 %
Platelets: 197 10*3/uL (ref 150–400)
RBC: 3.81 MIL/uL — ABNORMAL LOW (ref 3.87–5.11)
RDW: 13.1 % (ref 11.5–15.5)
WBC: 7.4 10*3/uL (ref 4.0–10.5)
nRBC: 0 % (ref 0.0–0.2)

## 2019-07-08 LAB — MAGNESIUM: Magnesium: 1.9 mg/dL (ref 1.7–2.4)

## 2019-07-08 MED ORDER — PANTOPRAZOLE SODIUM 40 MG PO TBEC
40.0000 mg | DELAYED_RELEASE_TABLET | Freq: Every morning | ORAL | Status: DC
  Administered 2019-07-08 – 2019-07-09 (×2): 40 mg via ORAL
  Filled 2019-07-08 (×2): qty 1

## 2019-07-08 NOTE — Progress Notes (Signed)
Physical Therapy Treatment Patient Details Name: Annette Quinn MRN: [id number] DOB: 25-Oct-1950 Today's Date: 07/08/2019    History of Present Illness Annette Quinn is a 37yoF who comes [facility] on 5/[facility] after a fall. Pt was bending over to pick up soem packages at home and fell down 6 stairs. PMH: peripheral neuropathy, meralgia paresthetica, essential tremor, unsteadiness, falls. work-up revealed laceration to the left side of her head that was sutured, as well as displaced left distal humerus fracture and distal both bone forearm fracture. In 2018 pt sustained a fall up 2 stairs at home   Lt humeral fracture converted to a rTSA.    PT Comments    Pt in bed, pain better controlled today, voices her hope to go home today.  Husband in room.  Stated she sleeps in lift chair at home and has [facility] and plans to use rollator as wheelchair for mobility needs.  5 steps with 1 rail into home.    To EOB with rail and mod a x 1.  Sitting steady with no LOB but does report dizziness with decreased with time but increased with standing 9/10.  Orthostatics taken and entered by RN who was in attendance.  Some decrease but she recovers with sitting and rest and does not limit session.  She is able to sit with general comfort today and remains standing for BP's.  She is able to turn to recliner, with cues to correct balance due to post lean, at bedside with min a x 2 and remain up in chair.  O2 checked on room air with mobility and decreased to 85% and O2 wa replaced at 2 lpm.  Did not wear O2 at baseline.  Discussed at length mobility and discharge.  SNF recommended and still stands after treatment session today.  She remains resistant and husband seemed to support her stating "we have done it before".  If they remain firm in discharge home, ambulance transfer home would be appropriate.  A wheelchair is needed as she is unable to walk household distances at this time.  She has a rollator and plans to use that for for  seated mobility as stated above but education provided on it's intended use and safety using it as a wheelchair.  Both voiced understanding.  She also stated she has an MD appt in office on Tuesday.  Discussed how she would get in/out of home for that appointment and she stated she was unsure.  Ambulance transfer to/from would also be recommended for that appointment if they chose to go home.  Discussed at length with MD and RN along with pt and husband.  Pt and husband were left to talk it over on their own.  All questions answered.     Patient suffers from LUE fracture which impairs his/her ability to perform daily activities like toileting, feeding, dressing, grooming, bathing in the home. A cane, walker, crutch will not resolve the patient's issue with performing activities of daily living. A lightweight wheelchair and cushion is required/recommended and will allow patient to safely perform daily activities.   Patient can safely propel the wheelchair in the home or has a caregiver who can provide assistance.    Follow Up Recommendations  SNF;Supervision for mobility/OOB     Equipment Recommendations  Wheelchair cushion (measurements PT)    Recommendations for Other Services       Precautions / Restrictions Precautions Precautions: Fall Required Braces or Orthoses: Sling;Splint/Cast Splint/Cast: LUE splint (donned at entry) total LUE Restrictions  Weight Bearing Restrictions: Yes LUE Weight Bearing: Non weight bearing(assumed d/t nature of splint and sling)    Mobility  Bed Mobility Overal bed mobility: Needs Assistance Bed Mobility: Supine to Sit     Supine to sit: Mod assist;HOB elevated     General bed mobility comments: sleeps in lift chair at home  Transfers Overall transfer level: Needs assistance Equipment used: 2 person hand held assist Transfers: Sit to/from [facility] Sit to Stand: Min assist;+2 safety/equipment             Ambulation/Gait Ambulation/Gait assistance: Min assist;+2 physical assistance Gait Distance (Feet): 3 Feet Assistive device: 2 person hand held assist Gait Pattern/deviations: Step-to pattern;Shuffle Gait velocity: decreased   General Gait Details: able to turn to recliner with min a x 2 for safety   Stairs             Wheelchair Mobility    Modified Rankin (Stroke Patients Only)       Balance Overall balance assessment: Needs assistance;History of Falls Sitting-balance support: No upper extremity supported;Feet supported;Feet unsupported Sitting balance-Leahy Scale: Good   Postural control: Posterior lean Standing balance support: Single extremity supported;During functional activity Standing balance-Leahy Scale: Poor Standing balance comment: hesistant with post leaning  "i'm falling backwards"                            Cognition Arousal/Alertness: Awake/alert Behavior During Therapy: WFL for tasks assessed/performed Overall Cognitive Status: Within Functional Limits for tasks assessed                                        Exercises      General Comments        Pertinent Vitals/Pain Pain Assessment: Faces Faces Pain Scale: Hurts a little bit Pain Location: Left lower arm Pain Descriptors / Indicators: Aching;Grimacing Pain Intervention(s): Limited activity within patient's tolerance;Monitored during session;Premedicated before session;Repositioned    Home Living                      Prior Function            PT Goals (current goals can now be found in the care plan section) Progress towards PT goals: Progressing toward goals    Frequency    7X/week      PT Plan Current plan remains appropriate    Co-evaluation              AM-PAC PT "6 Clicks" Mobility   Outcome Measure  Help needed turning from your back to your side while in a flat bed without using bedrails?: Total Help needed moving  from lying on your back to sitting on the side of a flat bed without using bedrails?: Total Help needed moving to and from a bed to a chair (including a wheelchair)?: A Lot Help needed standing up from a chair using your arms (e.g., wheelchair or bedside chair)?: A Lot Help needed to walk in hospital room?: A Lot Help needed climbing 3-5 steps with a railing? : Total 6 Click Score: 9    End of Session Equipment Utilized During Treatment: Oxygen Activity Tolerance: Patient tolerated treatment well Patient left: in chair;with call bell/phone within reach;with family/visitor present Nurse Communication: Mobility status       Time: 6468-0321 PT Time Calculation (min) (ACUTE ONLY): 24 min  Charges:  $Therapeutic Activity: 23-37 mins                    Chesley Noon, PTA 07/08/19, 10:28 AM

## 2019-07-08 NOTE — NC FL2 (Signed)
[place] LEVEL OF CARE SCREENING TOOL     IDENTIFICATION  Patient Name: Annette Quinn Birthdate: 1950/03/03 Sex: female Admission Date (Current Location): 07/05/2019  [place] and [place] Number:  Engineering geologist and Address:  [facility], [address], [place], [place] [address]      Provider Number: [id number]  Attending Physician Name and Address:  Jennye Boroughs, MD  Relative Name and Phone Number:       Current Level of Care: Hospital Recommended Level of Care: [place] Prior Approval Number:    Date Approved/Denied:   PASRR Number: [id number]  Discharge Plan: SNF    Current Diagnoses: Patient Active Problem List   Diagnosis Date Noted  . Fall down steps 07/06/2019  . Fracture of distal humerus 07/06/2019  . Accidental fall 07/05/2019  . Peripheral neuropathy 07/05/2019  . Benign essential tremor 07/05/2019  . Obstructive sleep apnea 07/05/2019  . Fracture of distal end of left humerus 07/05/2019  . Closed fracture of distal end of left radius 07/05/2019  . Acute respiratory failure with hypoxia ([place]) 07/05/2019  . Personal history of fall 07/05/2019  . Wrist fracture, closed, left, initial encounter 07/05/2019  . Scalp laceration, initial encounter 07/05/2019  . Status post reverse total shoulder replacement, left 01/20/2017    Orientation RESPIRATION BLADDER Height & Weight     Self, Time, Situation, Place  O2 Incontinent, Indwelling catheter Weight: 240 lb 1.3 oz (108.9 kg) Height:  5\' 5"  (165.1 cm)  BEHAVIORAL SYMPTOMS/MOOD NEUROLOGICAL BOWEL NUTRITION STATUS      Continent    AMBULATORY STATUS COMMUNICATION OF NEEDS Skin   Extensive Assist Verbally                         Personal Care Assistance Level of Assistance  Bathing, Dressing Bathing Assistance: Limited assistance   Dressing Assistance: Limited assistance     Functional Limitations Info  Sight Sight Info: Impaired        SPECIAL CARE FACTORS FREQUENCY  PT (By licensed PT), OT (By licensed OT)                    Contractures Contractures Info: Not present    Additional Factors Info  Code Status Code Status Info: FULL CODE             Current Medications (07/08/2019):  This is the current hospital active medication list Current Facility-Administered Medications  Medication Dose Route Frequency Provider Last Rate Last Admin  . acetaminophen (TYLENOL) tablet 650 mg  650 mg Oral Q6H PRN Athena Masse, MD       Or  . acetaminophen (TYLENOL) suppository 650 mg  650 mg Rectal Q6H PRN Athena Masse, MD      . albuterol (PROVENTIL) (2.5 MG/3ML) 0.083% nebulizer solution 2.5 mg  2.5 mg Nebulization Q2H PRN Athena Masse, MD      . azelastine (ASTELIN) 0.1 % nasal spray 1 spray  1 spray Each Nare BID PRN Jennye Boroughs, MD       And  . fluticasone (FLONASE) 50 MCG/ACT nasal spray 1 spray  1 spray Each Nare BID PRN Jennye Boroughs, MD      . busPIRone (BUSPAR) tablet 7.5 mg  7.5 mg Oral BID Jennye Boroughs, MD   7.5 mg at 07/08/19 0858  . cholecalciferol (VITAMIN D3) tablet 2,000 Units  2,000 Units Oral QAC breakfast Jennye Boroughs, MD   2,000 Units  at 07/08/19 0856  . enoxaparin (LOVENOX) injection 40 mg  40 mg Subcutaneous Q24H Athena Masse, MD   40 mg at 07/07/19 2251  . fenofibrate tablet 160 mg  160 mg Oral Q1200 Jennye Boroughs, MD   Stopped at 07/08/19 1200  . gabapentin (NEURONTIN) capsule 600 mg  600 mg Oral TID Athena Masse, MD   600 mg at 07/08/19 1645  . HYDROcodone-acetaminophen (NORCO/VICODIN) 5-325 MG per tablet 1-2 tablet  1-2 tablet Oral Q4H PRN Athena Masse, MD   2 tablet at 07/08/19 1645  . HYDROmorphone (DILAUDID) injection 1 mg  1 mg Intravenous Q4H PRN Jennye Boroughs, MD   1 mg at 07/08/19 0313  . montelukast (SINGULAIR) tablet 10 mg  10 mg Oral q morning - 10a Jennye Boroughs, MD   10 mg at 07/08/19 0858  . ondansetron (ZOFRAN) tablet 4 mg  4 mg Oral Q6H PRN  Athena Masse, MD       Or  . ondansetron [facility]) injection 4 mg  4 mg Intravenous Q6H PRN Athena Masse, MD      . oxyCODONE (Oxy IR/ROXICODONE) immediate release tablet 5-10 mg  5-10 mg Oral Q4H PRN Jennye Boroughs, MD      . pantoprazole (PROTONIX) EC tablet 40 mg  40 mg Oral q morning - 10a Jennye Boroughs, MD   40 mg at 07/08/19 0954  . propranolol (INDERAL) tablet 20 mg  20 mg Oral BID Jennye Boroughs, MD   20 mg at 07/08/19 0858  . raloxifene (EVISTA) tablet 60 mg  60 mg Oral Q1200 Jennye Boroughs, MD   Stopped at 07/08/19 1200  . senna-docusate (Senokot-S) tablet 1 tablet  1 tablet Oral QHS PRN Athena Masse, MD      . sertraline (ZOLOFT) tablet 200 mg  200 mg Oral Q lunch Jennye Boroughs, MD   Stopped at 07/08/19 1200  . tiZANidine (ZANAFLEX) tablet 4 mg  4 mg Oral BID Jennye Boroughs, MD   4 mg at 07/08/19 3710     Discharge Medications: Please see discharge summary for a list of discharge medications.  Relevant Imaging Results:  Relevant Lab Results:   Additional Information SS# [id number]  Amador Cunas, [place]

## 2019-07-08 NOTE — TOC Initial Note (Signed)
Transition of Care [facility]) - Initial/Assessment Note    Patient Details  Name: Annette Quinn MRN: [id number] Date of Birth: 07/20/1950  [facility]) CM/SW Contact:    Amador Cunas, [place] Phone Number: 07/08/2019, 5:04 PM  Clinical Narrative: Spoke to pt and pt's husband re PT/OT recommendation for SNF. Pt from home with husband. No previous SNF stay. SW explained SNF placement process and answered questions. Pt and husband agreeable to SW initiating SNF search while they consider HH vs. SNF. Will submit [employer] auth today and f/u with offers once available.   Wandra Feinstein, MSW, LCSW [phone] (coverage)                     Expected Discharge Plan: [place] Barriers to Discharge: Continued Medical Work up   Patient Goals and CMS Choice        Expected Discharge Plan and Services Expected Discharge Plan: [place]                                              Prior Living Arrangements/Services   Lives with:: Spouse Patient language and need for interpreter reviewed:: No Do you feel safe going back to the place where you live?: Yes      Need for Family Participation in Patient Care: Yes (Comment) Care giver support system in place?: No (comment)   Criminal Activity/Legal Involvement Pertinent to Current Situation/Hospitalization: No - Comment as needed  Activities of Daily Living Home Assistive Devices/Equipment: Cane (specify quad or straight), Walker (specify type) ADL Screening (condition at time of admission) Patient's cognitive ability adequate to safely complete daily activities?: Yes Is the patient deaf or have difficulty hearing?: No Does the patient have difficulty seeing, even when wearing glasses/contacts?: No Does the patient have difficulty concentrating, remembering, or making decisions?: No Patient able to express need for assistance with ADLs?: Yes Does the patient have difficulty  dressing or bathing?: Yes Independently performs ADLs?: No Communication: Independent Dressing (OT): Needs assistance Is this a change from baseline?: Change from baseline, expected to last <3days Grooming: Needs assistance Is this a change from baseline?: Change from baseline, expected to last <3 days Feeding: Independent Bathing: Needs assistance Is this a change from baseline?: Change from baseline, expected to last <3 days Toileting: Needs assistance Is this a change from baseline?: Change from baseline, expected to last <3 days In/Out Bed: Needs assistance Is this a change from baseline?: Change from baseline, expected to last <3 days Walks in Home: Independent Does the patient have difficulty walking or climbing stairs?: Yes Weakness of Legs: None Weakness of Arms/Hands: None  Permission Sought/Granted Permission sought to share information with : Facility Art therapist granted to share information with : Yes, Verbal Permission Granted              Emotional Assessment       Orientation: : Oriented to Self, Oriented to Place, Oriented to  Time, Oriented to Situation      Admission diagnosis:  Fracture [T14.8XXA] Acute respiratory failure with hypoxia (HCC) [J96.01] Minor head injury, initial encounter [S09.90XA] Laceration of scalp, initial encounter [S01.01XA] Closed fracture of distal end of left radius, unspecified fracture morphology, initial encounter [S52.502A] Other closed displaced fracture of distal end of left humerus, initial encounter [S42.492A] Fracture of distal humerus [S42.409A] Patient Active Problem List  Diagnosis Date Noted  . Fall down steps 07/06/2019  . Fracture of distal humerus 07/06/2019  . Accidental fall 07/05/2019  . Peripheral neuropathy 07/05/2019  . Benign essential tremor 07/05/2019  . Obstructive sleep apnea 07/05/2019  . Fracture of distal end of left humerus 07/05/2019  . Closed fracture of distal end of  left radius 07/05/2019  . Acute respiratory failure with hypoxia ([place]) 07/05/2019  . Personal history of fall 07/05/2019  . Wrist fracture, closed, left, initial encounter 07/05/2019  . Scalp laceration, initial encounter 07/05/2019  . Status post reverse total shoulder replacement, left 01/20/2017   PCP:  Kirk Ruths, MD Pharmacy:   [id number] - [place], [place] - [address] [place] [address] Phone: [phone] Fax: [place] #[id number] [place], [place] - [address] [place] [address] Phone: [phone] Fax: [phone]     Social Determinants of Health (SDOH) Interventions    Readmission Risk Interventions No flowsheet data found.

## 2019-07-08 NOTE — Progress Notes (Addendum)
Progress Note    Annette Quinn  CZY:[id number] DOB: 1950-08-25  DOA: 07/05/2019 PCP: Kirk Ruths, MD      Brief Narrative:    Medical records reviewed and are as summarized below:  Annette Quinn is an 69 y.o. female with medical history significant for peripheral neuropathy and meralgia paresthetica, essential tremor, OSA on CPAP, followed by neurology with history of imbalance and history of a prior fall who presented to the emergency room on 07/05/2019 following a fall.  Patient said she bent over to pick up some [employer] boxes that were left on her stairs and she fell forward, going down 6 stairs. She fell onto the left side sustaining injury to the left upper extremity and left frontal scalp.       Assessment/Plan:   Principal Problem:   Acute respiratory failure with hypoxia (HCC) Active Problems:   Accidental fall   Peripheral neuropathy   Benign essential tremor   Obstructive sleep apnea   Fracture of distal end of left humerus   Closed fracture of distal end of left radius   Personal history of fall   Wrist fracture, closed, left, initial encounter   Scalp laceration, initial encounter   Fall down steps   Fracture of distal humerus  Acute on chronic hypoxemic respiratory failure -Oxygen saturation dropped to 85% on room air while sitting up in bed.  It improved with 2 L of oxygen via nasal cannula.  Patient has been informed that she would need home oxygen and she is agreeable.  Continue to monitor She has a 43-pack-year smoking history and is at very high risk for COPD.  COPD may be the underlying reason for hypoxemia.  Follow-up with pulmonologist for pulmonary function test was recommended.  Accidental fall down steps/displaced fracture of distal end of left humerus, fracture of distal end of left radius -She was evaluated by orthopedist Dr. Mack Guise who recommended outpatient follow-up  with Dr. Peggye Ley, hand specialist.  She will see Dr. Mack Guise for  follow-up on Tuesday, July 10, 2019. Continue IV Dilaudid as needed for pain control.     Personal history of fall   Peripheral neuropathy   Benign essential tremor -Outpatient follow-up with neurology  Laceration left scalp -Sutured in the emergency room -Keep wound clean. --  Arrange for suture removal at discharge    Obstructive sleep apnea -CPAP at night  Urinary retention She said she has a history of urinary retention.  Intermittent ureteral catheterization as needed.  Mild hypotension, dizziness No evidence of orthostatic hypotension.  Continue to monitor.  Of note, patient is on bumetanide.  Hold bumetanide for now.  Check CBC and BMP  Body mass index is 39.95 kg/m.  (Morbid obesity): This complicates overall prognosis and care   Family Communication/Anticipated D/C date and plan/Code Status   DVT prophylaxis: Lovenox Code Status: Full code Family Communication: Plan discussed with her husband Disposition Plan:    Status is: Inpatient  Remains inpatient appropriate because:Ongoing active pain requiring inpatient pain management and Unsafe d/c plan   Dispo: The patient is from: Home              Anticipated d/c is to: Home              Anticipated d/c date is: 1 day              Patient currently is not medically stable to d/c.  Patient wanted to go home today.  However, she  changed her mind because she realized that she would not be able to manage at home even with her husband's help.  She understands that she is at increased risk for falls.  She requested to stay in the hospital 1 more day to see whether there will be any improvement in pain and her functional capacity to enable her to cope or manage at home.  She said going to SNF is not an option.                Subjective:    C/o pain in the left upper extremity.  Pain is a little better today.  She thinks Dilaudid is better than morphine in terms of relieving her pain. S/o dizziness when  she sits up or stands.  She is adamant about going home rather than SNF despite PT's recommendation.   Objective:    Vitals:   07/08/19 0956 07/08/19 1000 07/08/19 1004 07/08/19 1005  BP: (!) 97/59 (!) 114/58 (!) 98/59   Pulse: 67 68 72   Resp:      Temp:      TempSrc:      SpO2: 97% 94% (!) 86% 94%  Weight:      Height:       No data found.   Intake/Output Summary (Last 24 hours) at 07/08/2019 1119 Last data filed at 07/08/2019 0900 Gross per 24 hour  Intake 720 ml  Output 850 ml  Net -130 ml   Filed Weights   07/05/19 1452  Weight: 108.9 kg    Exam:  GEN: NAD SKIN: Warm and dry.  Sutured laceration on left frontal scalp  EYES: No pallor or icterus  ENT: MMM CV: Regular rate and rhythm PULM: CTA B ABD: soft, ND, NT, +BS CNS: AAO x 3, non focal EXT: Left arm in sling and dressed with bandage   Data Reviewed:   I have personally reviewed following labs and imaging studies:  Labs: Labs show the following:   Basic Metabolic Panel: Recent Labs  Lab 07/05/19 1406  NA 140  K 3.4*  CL 101  CO2 30  GLUCOSE 113*  BUN 14  CREATININE 0.84  CALCIUM 8.6*   GFR Estimated Creatinine Clearance: 78.7 mL/min (by C-G formula based on SCr of 0.84 mg/dL). Liver Function Tests: Recent Labs  Lab 07/05/19 1406  AST 21  ALT 16  ALKPHOS 28*  BILITOT 1.0  PROT 6.3*  ALBUMIN 3.5   No results for input(s): LIPASE, AMYLASE in the last 168 hours. No results for input(s): AMMONIA in the last 168 hours. Coagulation profile Recent Labs  Lab 07/05/19 1406  INR 1.0    CBC: Recent Labs  Lab 07/05/19 1406  WBC 8.4  HGB 12.5  HCT 37.3  MCV 87.1  PLT 210   Cardiac Enzymes: No results for input(s): CKTOTAL, CKMB, CKMBINDEX, TROPONINI in the last 168 hours. BNP (last 3 results) No results for input(s): PROBNP in the last 8760 hours. CBG: No results for input(s): GLUCAP in the last 168 hours. D-Dimer: No results for input(s): DDIMER in the last 72  hours. Hgb A1c: No results for input(s): HGBA1C in the last 72 hours. Lipid Profile: No results for input(s): CHOL, HDL, LDLCALC, TRIG, CHOLHDL, LDLDIRECT in the last 72 hours. Thyroid function studies: No results for input(s): TSH, T4TOTAL, T3FREE, THYROIDAB in the last 72 hours.  Invalid input(s): FREET3 Anemia work up: No results for input(s): VITAMINB12, FOLATE, FERRITIN, TIBC, IRON, RETICCTPCT in the last 72 hours. Sepsis Labs:  Recent Labs  Lab 07/05/19 1406  WBC 8.4    Microbiology Recent Results (from the past 240 hour(s))  SARS Coronavirus 2 by RT PCR (hospital order, performed in [facility] hospital lab) Nasopharyngeal Nasopharyngeal Swab     Status: None   Collection Time: 07/05/19  4:37 PM   Specimen: Nasopharyngeal Swab  Result Value Ref Range Status   SARS Coronavirus 2 NEGATIVE NEGATIVE Final    Comment: (NOTE) SARS-CoV-2 target nucleic acids are NOT DETECTED. The SARS-CoV-2 RNA is generally detectable in upper and lower respiratory specimens during the acute phase of infection. The lowest concentration of SARS-CoV-2 viral copies this assay can detect is 250 copies / mL. A negative result does not preclude SARS-CoV-2 infection and should not be used as the sole basis for treatment or other patient management decisions.  A negative result may occur with improper specimen collection / handling, submission of specimen other than nasopharyngeal swab, presence of viral mutation(s) within the areas targeted by this assay, and inadequate number of viral copies (<250 copies / mL). A negative result must be combined with clinical observations, patient history, and epidemiological information. Fact Sheet for Patients:   StrictlyIdeas.no Fact Sheet for Healthcare Providers: BankingDealers.co.za This test is not yet approved or cleared  by the [place] FDA and has been authorized for detection and/or diagnosis of  SARS-CoV-2 by FDA under an Emergency Use Authorization (EUA).  This EUA will remain in effect (meaning this test can be used) for the duration of the COVID-19 declaration under Section 564(b)(1) of the Act, 21 U.S.C. section 360bbb-3(b)(1), unless the authorization is terminated or revoked sooner. Performed at [facility], [address] [place], [place] [address]     Procedures and diagnostic studies:  No results found.  Medications:   . bumetanide  1 mg Oral Daily  . busPIRone  7.5 mg Oral BID  . cholecalciferol  2,000 Units Oral QAC breakfast  . enoxaparin (LOVENOX) injection  40 mg Subcutaneous Q24H  . fenofibrate  160 mg Oral Q1200  . gabapentin  600 mg Oral TID  . montelukast  10 mg Oral q morning - 10a  . pantoprazole  40 mg Oral q morning - 10a  . propranolol  20 mg Oral BID  . raloxifene  60 mg Oral Q1200  . sertraline  200 mg Oral Q lunch  . tiZANidine  4 mg Oral BID   Continuous Infusions:   LOS: 2 days   Dylon Correa  [facility]     07/08/2019, 11:19 AM

## 2019-07-09 DIAGNOSIS — W19XXXD Unspecified fall, subsequent encounter: Secondary | ICD-10-CM

## 2019-07-09 DIAGNOSIS — J9621 Acute and chronic respiratory failure with hypoxia: Secondary | ICD-10-CM

## 2019-07-09 MED ORDER — HYDROCODONE-ACETAMINOPHEN 5-325 MG PO TABS: 1.0000 | ORAL_TABLET | Freq: Four times a day (QID) | ORAL | 0 refills | Status: DC | PRN

## 2019-07-09 NOTE — Discharge Instructions (Signed)
Distal Humerus Elbow Fracture  A distal humerus elbow fracture is a break in the upper arm bone (humerus). The break happens in the lower (distal) part of the humerus, near the elbow. What are the causes? This condition may be caused by:  A hard, direct hit. This may happen as a result of being hit with an object, being in a motor vehicle accident, or having a collision with another person.  Falling onto an outstretched arm. What increases the risk? You are more likely to develop this condition if:  You are young.  You are elderly and have low bone density(osteoporosis).  You participate in activities where you are likely to have an injury or a fall, such as: ? Gymnastics. ? Football and other contact sports. ? Skateboarding. ? Biking. What are the signs or symptoms? Symptoms of this condition include:  Pain that is sudden and severe.  Tenderness of the elbow as well as the area near the elbow.  Inability to move the elbow or straighten the arm.  Swelling of the elbow.  Bruising.  Stiffness.  A feeling that the elbow is unstable. In severe cases, the bone can protrude through the skin. This type of elbow fracture is a medical emergency. How is this diagnosed? This condition is diagnosed based on your symptoms, your medical history, and a physical exam. You may also have X-rays to confirm the diagnosis. How is this treated? This condition may be treated by:  Wearing a splint or cast and a sling to hold your elbow still (immobilized) while it heals.  Applying ice to your elbow to relieve pain and reduce swelling.  Taking NSAIDs, such as ibuprofen, to reduce pain and swelling.  Doing exercises to help you regain movement (physical therapy).  Having surgery if the bones in your elbow are out of position. Follow these instructions at home: Medicines  Take over-the-counter and prescription medicines only as told by your health care provider.  Ask your health  care provider if the medicine prescribed to you: ? Requires you to avoid driving or using heavy machinery. ? Can cause constipation. You may need to take these actions to prevent or treat constipation:  Drink enough fluid to keep your urine pale yellow.  Take over-the-counter or prescription medicines.  Eat foods that are high in fiber, such as beans, whole grains, and fresh fruits and vegetables.  Limit foods that are high in fat and processed sugars, such as fried or sweet foods. If you have a cast or splint:  Do not put pressure on any part of the cast or splint until it is fully hardened. This may take several hours.  Check the skin around it every day. Tell your health care provider about any concerns. If you have a cast:  Do not stick anything inside it to scratch your skin. Doing that increases your risk of infection.  You may put lotion on dry skin around the edges of the cast. Do not put lotion on the skin underneath it.  Keep it clean and dry. If you have a splint or sling:  Wear it as told by your health care provider. Remove it only as told by your health care provider.  Loosen it if your fingers tingle, become numb, or turn cold and blue.  Keep it clean and dry. Bathing  Do not take baths, swim, or use a hot tub until your health care provider approves. Ask your health care provider if you may take showers. You may  only be allowed to take sponge baths.  If the cast, splint, or sling is not waterproof: ? Do not let it get wet. ? Cover it with a waterproof covering when you take a bath or shower. Managing pain, stiffness, and swelling   If directed, put ice on the injured area. ? If you have a removable splint or sling, remove it as told by your health care provider. ? Put ice in a plastic bag. ? Place a towel between your skin and the bag or between your cast and the bag. ? Leave the ice on for 20 minutes, 2-3 times a day.  Move your fingers often to reduce  stiffness and swelling.  Raise (elevate) the injured area above the level of your heart while you are sitting or lying down. Activity  Ask your health care provider when it is safe to drive if you have a cast, splint, or sling on your arm.  Do exercises as told by your health care provider.  Return to your normal activities as told by your health care provider. Ask your health care provider what activities are safe for you. Safety  Do not use the injured limb to support your body weight until your health care provider says that you can.  Do not lift anything that is heavier than 10 lb (4.5 kg), or the limit that you are told, until your health care provider says that it is safe.  Do not pull or push objects with your injured arm until your health care provider says that it is safe. General instructions  Do not use any products that contain nicotine or tobacco, such as cigarettes, e-cigarettes, and chewing tobacco. These can delay bone healing. If you need help quitting, ask your health care provider.  Keep all follow-up visits as told by your health care provider. This is important. How is this prevented?  Wear the appropriate protective equipment during sports, such as elbow pads.  Make sure to use equipment that fits you.  Maintain physical fitness, including: ? Strength. ? Flexibility. Contact a health care provider if you have:  Pain that gets worse or does not improve. Get help right away if you have:  Ongoing numbness or weakness in your elbow, hand, or fingers.  Discoloration of your fingers.  Severe pain that is much worse or different than before.  Swelling that suddenly worsens. Summary  A distal humerus elbow fracture is a break in the upper arm bone (humerus) near the elbow.  This condition may be caused by a hard, direct hit or falling onto an outstretched arm.  This condition is often treated with a splint or cast and a sling to hold your elbow still  (immobilized) while it heals. Surgery may be needed if the bones in your elbow are out of position.  Contact your health care provider if you have pain that gets worse or does not improve. This information is not intended to replace advice given to you by your health care provider. Make sure you discuss any questions you have with your health care provider. Document Revised: 05/17/2018 Document Reviewed: 04/05/2018 Elsevier Patient Education  [place]. Acute Respiratory Failure, Adult  Acute respiratory failure occurs when there is not enough oxygen passing from your lungs to your body. When this happens, your lungs have trouble removing carbon dioxide from the blood. This causes your blood oxygen level to drop too low as carbon dioxide builds up. Acute respiratory failure is a medical emergency. It can  develop quickly, but it is temporary if treated promptly. Your lung capacity, or how much air your lungs can hold, may improve with time, exercise, and treatment. What are the causes? There are many possible causes of acute respiratory failure, including:  Lung injury.  Chest injury or damage to the ribs or tissues near the lungs.  Lung conditions that affect the flow of air and blood into and out of the lungs, such as pneumonia, acute respiratory distress syndrome, and cystic fibrosis.  Medical conditions, such as strokes or spinal cord injuries, that affect the muscles and nerves that control breathing.  Blood infection (sepsis).  Inflammation of the pancreas (pancreatitis).  A blood clot in the lungs (pulmonary embolism).  A large-volume blood transfusion.  Burns.  Near-drowning.  Seizure.  Smoke inhalation.  Reaction to medicines.  Alcohol or drug overdose. What increases the risk? This condition is more likely to develop in people who have:  A blocked airway.  Asthma.  A condition or disease that damages or weakens the muscles, nerves, bones, or tissues  that are involved in breathing.  A serious infection.  A health problem that blocks the unconscious reflex that is involved in breathing, such as hypothyroidism or sleep apnea.  A lung injury or trauma. What are the signs or symptoms? Trouble breathing is the main symptom of acute respiratory failure. Symptoms may also include:  Rapid breathing.  Restlessness or anxiety.  Skin, lips, or fingernails that appear blue (cyanosis).  Rapid heart rate.  Abnormal heart rhythms (arrhythmias).  Confusion or changes in behavior.  Tiredness or loss of energy.  Feeling sleepy or having a loss of consciousness. How is this diagnosed? Your health care provider can diagnose acute respiratory failure with a medical history and physical exam. During the exam, your health care provider will listen to your heart and check for crackling or wheezing sounds in your lungs. Your may also have tests to confirm the diagnosis and determine what is causing respiratory failure. These tests may include:  Measuring the amount of oxygen in your blood (pulse oximetry). The measurement comes from a small device that is placed on your finger, earlobe, or toe.  Other blood tests to measure blood gases and to look for signs of infection.  Sampling your cerebral spinal fluid or tracheal fluid to check for infections.  Chest X-ray to look for fluid in spaces that should be filled with air.  Electrocardiogram (ECG) to look at the heart's electrical activity. How is this treated? Treatment for this condition usually takes places in a hospital intensive care unit (ICU). Treatment depends on what is causing the condition. It may include one or more treatments until your symptoms improve. Treatment may include:  Supplemental oxygen. Extra oxygen is given through a tube in the nose, a face mask, or a hood.  A device such as a continuous positive airway pressure (CPAP) or bi-level positive airway pressure (BiPAP or BPAP)  machine. This treatment uses mild air pressure to keep the airways open. A mask or other device will be placed over your nose or mouth. A tube that is connected to a motor will deliver oxygen through the mask.  Ventilator. This treatment helps move air into and out of the lungs. This may be done with a bag and mask or a machine. For this treatment, a tube is placed in your windpipe (trachea) so air and oxygen can flow to the lungs.  Extracorporeal membrane oxygenation (ECMO). This treatment temporarily takes over the function  of the heart and lungs, supplying oxygen and removing carbon dioxide. ECMO gives the lungs a chance to recover. It may be used if a ventilator is not effective.  Tracheostomy. This is a procedure that creates a hole in the neck to insert a breathing tube.  Receiving fluids and medicines.  Rocking the bed to help breathing. Follow these instructions at home:  Take over-the-counter and prescription medicines only as told by your health care provider.  Return to normal activities as told by your health care provider. Ask your health care provider what activities are safe for you.  Keep all follow-up visits as told by your health care provider. This is important. How is this prevented? Treating infections and medical conditions that may lead to acute respiratory failure can help prevent the condition from developing. Contact a health care provider if:  You have a fever.  Your symptoms do not improve or they get worse. Get help right away if:  You are having trouble breathing.  You lose consciousness.  Your have cyanosis or turn blue.  You develop a rapid heart rate.  You are confused. These symptoms may represent a serious problem that is an emergency. Do not wait to see if the symptoms will go away. Get medical help right away. Call your local emergency services (911 in the [place] Do not drive yourself to the hospital. This information is not intended to  replace advice given to you by your health care provider. Make sure you discuss any questions you have with your health care provider. Document Revised: 01/07/2017 Document Reviewed: 08/13/2015 Elsevier Patient Education  [place].

## 2019-07-09 NOTE — Progress Notes (Cosign Needed Addendum)
Patient suffers from weakness and respiratory failure which impairs their ability to perform daily activities like ADLs in the home.  A walking aid will not resolve  issue with performing activities of daily living. A wheelchair will allow patient to safely perform daily activities. Patient is not able to propel themselves in the home using a standard weight wheelchair due to weakness. Patient can self propel in the lightweight wheelchair. Length of need 99 months. Accessories: elevating leg rests back cushion, wheel locks, extensions and anti-tippers.

## 2019-07-09 NOTE — TOC Progression Note (Signed)
Transition of Care [facility]) - Progression Note    Patient Details  Name: Annette Quinn MRN: [id number] Date of Birth: [birth year]  [facility]) CM/SW Contact  Su Hilt, RN Phone Number: 07/09/2019, 9:15 AM  Clinical Narrative:     [facility] to inquire about auth, the office is closed and no auth approval obtained  Expected Discharge Plan: [place] Barriers to Discharge: Continued Medical Work up  Expected Discharge Plan and Services Expected Discharge Plan: [place]                                               Social Determinants of Health (SDOH) Interventions    Readmission Risk Interventions No flowsheet data found.

## 2019-07-09 NOTE — TOC Transition Note (Signed)
Transition of Care [facility]) - CM/SW Discharge Note   Patient Details  Name: Annette Quinn MRN: [id number] Date of Birth: Aug 17, 1950  [facility]) CM/SW Contact:  Su Hilt, RN Phone Number: 07/09/2019, 12:34 PM   Clinical Narrative:    I went in to let the patient know that the oxygen is on its way, Her husband has already taken the 3 in 1 home, They told me that she will need a wheelchair as Well, I notified Jermain with [employer], I asked the patient if she will need EMS to get home and she said no,      Barriers to Discharge: Continued Medical Work up   Patient Goals and CMS Choice Patient states their goals for this hospitalization and ongoing recovery are:: go home with Physical therapy home health      Discharge Placement                       Discharge Plan and Services   Discharge Planning Services: CM Consult            DME Arranged: Wheelchair manual DME Agency: ([place]) Date DME Agency Contacted: 07/09/19 Time DME Agency Contacted: [id number] Representative spoke with at DME Agency: [facility] HH Arranged: PT [place]: [facility] (formerly [employer]) Date [place]: 07/09/19 Time [place]: [phone] Representative spoke with at [place]: [employer] ([place]) Interventions     Readmission Risk Interventions No flowsheet data found.

## 2019-07-09 NOTE — Discharge Summary (Signed)
Physician Discharge Summary  Annette Quinn:[id number] DOB: July 14, 1950 DOA: 07/05/2019  PCP: Kirk Ruths, MD  Admit date: 07/05/2019 Discharge date: 07/09/2019  Discharge disposition: Home with home health care   Recommendations for Outpatient Follow-Up:   Outpatient follow-up with Dr. Mack Guise, orthopedic surgeon, on July 10, 2019 Outpatient follow-up with hand surgeon, Dr. Peggye Ley within 2 to 4 days of discharge Outpatient follow-up with pulmonologist for pulmonary function study (PCP can arrange this) Outpatient follow-up with PCP in 1 week   Discharge Diagnosis:   Principal Problem:   Fracture of distal end of left humerus Active Problems:   Accidental fall   Peripheral neuropathy   Benign essential tremor   Obstructive sleep apnea   Closed fracture of distal end of left radius   Acute on chronic respiratory failure with hypoxemia (HCC)   Personal history of fall   Wrist fracture, closed, left, initial encounter   Scalp laceration, initial encounter   Fall down steps   Fracture of distal humerus    Discharge Condition: Stable.  Diet recommendation: Heart healthy diet  Code status: Full code.    Hospital Course:    Annette Quinn is a 69 y.o. female with medical history significant forperipheral neuropathy and meralgia paresthetica, essential tremor, OSA on CPAP, followed by neurology with history of imbalance and history of apriorfall, who presented to the emergency room on 07/05/2019 following a fall.Patient said she bent over to pick up some [employer] boxes that were left on her stairs and she fell forward, going down 6 stairs. She fell onto the left side sustaininginjury to the left upper extremity and left frontal scalp.  She had a left frontal scalp laceration that was sutured in the emergency room.  She also had displaced fracture of the distal end of left humerus and fracture of distal end of left radius.  She was evaluated by orthopedic  surgeon, Dr. Mack Guise, who recommended splinting and subsequent outpatient follow-up for further management.  Patient was also found to have acute hypoxemic respiratory failure.  Subsequently, it was determined that she has chronic hypoxemic respiratory failure as she could not be weaned off of oxygen.  Etiology of chronic hypoxemic respiratory failure is not clear but it is suspected that it may be due to underlying undiagnosed COPD because of extensive 43-pack-year smoking history.  She was discharged home on 2 L of oxygen via nasal cannula.  Follow-up with pulmonologist for pulmonary function study was recommended.  Of note, her blood pressure has been running low.  She was on Bumex for " ankle swelling" and propranolol for tremors.  These were discontinued at discharge since patient had been complaining of dizziness especially on sitting or standing.  Orthostatic vital signs was negative.  She will follow-up with her PCP for further recommendations  She was evaluated by PT and OT recommended further rehabilitation at a skilled nursing facility.  However, patient was adamant about going home with home health therapy despite being at high risk for falls at home.  She said her husband would help her at home.     Vicodin prescription was sent electronically to [facility] in [place].  However, I was informed by the nurse that patient had requested Vicodin to be sent to [facility] in [place] since the pharmacy in [place] was closed.  I called [facility] in [place] to discuss this with the pharmacist on call and sent a new electronic prescription to [facility] in [place].  I requested that  initial Vicodin prescription sent to [facility] be canceled.  He said he would follow-up on that.       Discharge Exam:   Vitals:   07/09/19 1029 07/09/19 1033  BP: (!) 106/49 94/65  Pulse: 64 64  Resp:    Temp:    SpO2:     Vitals:   07/09/19 0016 07/09/19 0806 07/09/19  1029 07/09/19 1033  BP: (!) 110/53 (!) 106/39 (!) 106/49 94/65  Pulse: 61 (!) 57 64 64  Resp: 18     Temp: 97.7 F (36.5 C) (!) 97.3 F (36.3 C)    TempSrc: Oral Oral    SpO2: 99% 100%    Weight:      Height:        GEN: NAD SKIN: Warm and dry.  Sutured laceration on left frontal scalp  EYES: No pallor or icterus  ENT: MMM CV: Regular rate and rhythm PULM: CTA B ABD: soft, obese, NT, +BS CNS: AAO x 3, non focal EXT: Left arm in sling      The results of significant diagnostics from this hospitalization (including imaging, microbiology, ancillary and laboratory) are listed below for reference.     Procedures and Diagnostic Studies:   DG Forearm Left  Result Date: 07/05/2019 CLINICAL DATA:  Pain EXAM: LEFT FOREARM - 2 VIEW COMPARISON:  None. FINDINGS: There are acute impacted intra-articular fractures of both the distal radius and ulna. There is significant surrounding soft tissue swelling and volar angulation of the fracture fragments. There are multiple small well corticated osseous fragments projecting in the dorsal soft tissues at the level of the wrist. These may be related to an old remote injury such as a prior triquetral fracture. There is an acute displaced fracture of the distal humerus. IMPRESSION: 1. Acute impacted intra-articular fractures of the distal radius and ulna with significant surrounding soft tissue swelling. 2. Acute displaced fracture of the distal humerus. Electronically Signed   By: Constance Holster M.D.   On: 07/05/2019 15:08   CT Head Wo Contrast  Result Date: 07/05/2019 CLINICAL DATA:  [place] downstairs, left scalp laceration EXAM: CT HEAD WITHOUT CONTRAST CT CERVICAL SPINE WITHOUT CONTRAST TECHNIQUE: Multidetector CT imaging of the head and cervical spine was performed following the standard protocol without intravenous contrast. Multiplanar CT image reconstructions of the cervical spine were also generated. COMPARISON:  04/26/2019 FINDINGS: CT HEAD  FINDINGS Brain: No acute infarct or hemorrhage. Lateral ventricles and midline structures are unremarkable. No acute extra-axial fluid collections. No mass effect. Vascular: No hyperdense vessel or unexpected calcification. Skull: There is a left frontal scalp laceration. No underlying fracture. No destructive bony lesions. Sinuses/Orbits: No acute finding. Other: None. CT CERVICAL SPINE FINDINGS Alignment: Alignment is grossly anatomic. Skull base and vertebrae: No acute displaced fractures. Soft tissues and spinal canal: No prevertebral fluid or swelling. No visible canal hematoma. Disc levels: Multilevel cervical spondylosis is seen from C3/C4 through C6/C7. Circumferential disc osteophyte complex and bilateral uncovertebral hypertrophy contribute to mild symmetrical neural foraminal encroachment at C3/C4, C4/C5, and C5/C6. Upper chest: Upper lobe emphysematous changes are seen. The airway is patent. Other: Reconstructed images demonstrate no additional findings. IMPRESSION: 1. Left frontal scalp laceration. No underlying fracture. 2. No acute intracranial process. 3. Multilevel cervical spondylosis. 4. No cervical spine fracture. Electronically Signed   By: Randa Ngo M.D.   On: 07/05/2019 16:24   CT Cervical Spine Wo Contrast  Result Date: 07/05/2019 CLINICAL DATA:  [place] downstairs, left scalp laceration EXAM: CT HEAD WITHOUT CONTRAST  CT CERVICAL SPINE WITHOUT CONTRAST TECHNIQUE: Multidetector CT imaging of the head and cervical spine was performed following the standard protocol without intravenous contrast. Multiplanar CT image reconstructions of the cervical spine were also generated. COMPARISON:  04/26/2019 FINDINGS: CT HEAD FINDINGS Brain: No acute infarct or hemorrhage. Lateral ventricles and midline structures are unremarkable. No acute extra-axial fluid collections. No mass effect. Vascular: No hyperdense vessel or unexpected calcification. Skull: There is a left frontal scalp laceration. No  underlying fracture. No destructive bony lesions. Sinuses/Orbits: No acute finding. Other: None. CT CERVICAL SPINE FINDINGS Alignment: Alignment is grossly anatomic. Skull base and vertebrae: No acute displaced fractures. Soft tissues and spinal canal: No prevertebral fluid or swelling. No visible canal hematoma. Disc levels: Multilevel cervical spondylosis is seen from C3/C4 through C6/C7. Circumferential disc osteophyte complex and bilateral uncovertebral hypertrophy contribute to mild symmetrical neural foraminal encroachment at C3/C4, C4/C5, and C5/C6. Upper chest: Upper lobe emphysematous changes are seen. The airway is patent. Other: Reconstructed images demonstrate no additional findings. IMPRESSION: 1. Left frontal scalp laceration. No underlying fracture. 2. No acute intracranial process. 3. Multilevel cervical spondylosis. 4. No cervical spine fracture. Electronically Signed   By: Randa Ngo M.D.   On: 07/05/2019 16:24   CT ELBOW LEFT WO CONTRAST  Result Date: 07/05/2019 CLINICAL DATA:  Fracture. EXAM: CT OF THE UPPER LEFT EXTREMITY WITHOUT CONTRAST 3-DIMENSIONAL CT IMAGE RENDERING ON ACQUISITION WORKSTATION TECHNIQUE: Multidetector CT imaging of the upper left extremity was performed according to the standard protocol. 3-dimensional CT images were rendered by post-processing of the original CT data on an acquisition workstation. The 3-dimensional CT images were interpreted and findings were reported in the accompanying complete CT report for this study COMPARISON:  X-ray from same day FINDINGS: Bones/Joint/Cartilage Again noted is an acute, significantly displaced fracture of the distal humerus. There is significant overlap of the proximal and distal fracture fragments. Multiple small fracture fragments surround the fracture site. There is a questionable nondisplaced fracture involving the radial head (series 13, image 48). Ligaments Suboptimally assessed by CT. Muscles and Tendons The muscles are  unremarkable by CT standards. Soft tissues There is soft tissue swelling about the elbow. There is an elbow joint effusion. IMPRESSION: 1. Acute, significantly displaced supracondylar fracture of the distal humerus. 2. Questionable nondisplaced fracture of the radial head, favored to represent artifact. Electronically Signed   By: Constance Holster M.D.   On: 07/05/2019 19:09   CT 3D RECON AT SCANNER  Result Date: 07/05/2019 CLINICAL DATA:  Fracture. EXAM: CT OF THE UPPER LEFT EXTREMITY WITHOUT CONTRAST 3-DIMENSIONAL CT IMAGE RENDERING ON ACQUISITION WORKSTATION TECHNIQUE: Multidetector CT imaging of the upper left extremity was performed according to the standard protocol. 3-dimensional CT images were rendered by post-processing of the original CT data on an acquisition workstation. The 3-dimensional CT images were interpreted and findings were reported in the accompanying complete CT report for this study COMPARISON:  X-ray from same day FINDINGS: Bones/Joint/Cartilage Again noted is an acute, significantly displaced fracture of the distal humerus. There is significant overlap of the proximal and distal fracture fragments. Multiple small fracture fragments surround the fracture site. There is a questionable nondisplaced fracture involving the radial head (series 13, image 48). Ligaments Suboptimally assessed by CT. Muscles and Tendons The muscles are unremarkable by CT standards. Soft tissues There is soft tissue swelling about the elbow. There is an elbow joint effusion. IMPRESSION: 1. Acute, significantly displaced supracondylar fracture of the distal humerus. 2. Questionable nondisplaced fracture of the radial head, favored  to represent artifact. Electronically Signed   By: Constance Holster M.D.   On: 07/05/2019 19:09   DG Chest Portable 1 View  Result Date: 07/05/2019 CLINICAL DATA:  Fall, hypoxia. EXAM: PORTABLE CHEST 1 VIEW COMPARISON:  None. FINDINGS: Lungs are somewhat hypoinflated with no lobar  consolidation or effusion. Borderline cardiomegaly. Left shoulder arthroplasty intact. IMPRESSION: No active disease. Electronically Signed   By: Marin Olp M.D.   On: 07/05/2019 16:43   DG Humerus Left  Result Date: 07/05/2019 CLINICAL DATA:  Pain EXAM: LEFT HUMERUS - 2+ VIEW COMPARISON:  None. FINDINGS: The patient is status post total shoulder arthroplasty. There is an acute, significantly displaced fracture of the distal humerus. There is extensive surrounding soft tissue swelling. There is no definite dislocation, however evaluation is limited by patient positioning. IMPRESSION: 1. Acute significantly displaced fracture of the distal humerus. 2. Status post total shoulder arthroplasty. Electronically Signed   By: Constance Holster M.D.   On: 07/05/2019 15:09     Labs:   Basic Metabolic Panel: Recent Labs  Lab 07/05/19 1406 07/08/19 1235  NA 140 138  K 3.4* 4.0  CL 101 97*  CO2 30 34*  GLUCOSE 113* 113*  BUN 14 14  CREATININE 0.84 0.80  CALCIUM 8.6* 8.7*  MG  --  1.9   GFR Estimated Creatinine Clearance: 82.7 mL/min (by C-G formula based on SCr of 0.8 mg/dL). Liver Function Tests: Recent Labs  Lab 07/05/19 1406  AST 21  ALT 16  ALKPHOS 28*  BILITOT 1.0  PROT 6.3*  ALBUMIN 3.5   No results for input(s): LIPASE, AMYLASE in the last 168 hours. No results for input(s): AMMONIA in the last 168 hours. Coagulation profile Recent Labs  Lab 07/05/19 1406  INR 1.0    CBC: Recent Labs  Lab 07/05/19 1406 07/08/19 1235  WBC 8.4 7.4  NEUTROABS  --  5.5  HGB 12.5 11.1*  HCT 37.3 32.9*  MCV 87.1 86.4  PLT 210 197   Cardiac Enzymes: No results for input(s): CKTOTAL, CKMB, CKMBINDEX, TROPONINI in the last 168 hours. BNP: Invalid input(s): POCBNP CBG: No results for input(s): GLUCAP in the last 168 hours. D-Dimer No results for input(s): DDIMER in the last 72 hours. Hgb A1c No results for input(s): HGBA1C in the last 72 hours. Lipid Profile No results for  input(s): CHOL, HDL, LDLCALC, TRIG, CHOLHDL, LDLDIRECT in the last 72 hours. Thyroid function studies No results for input(s): TSH, T4TOTAL, T3FREE, THYROIDAB in the last 72 hours.  Invalid input(s): FREET3 Anemia work up No results for input(s): VITAMINB12, FOLATE, FERRITIN, TIBC, IRON, RETICCTPCT in the last 72 hours. Microbiology Recent Results (from the past 240 hour(s))  SARS Coronavirus 2 by RT PCR (hospital order, performed in [facility] hospital lab) Nasopharyngeal Nasopharyngeal Swab     Status: None   Collection Time: 07/05/19  4:37 PM   Specimen: Nasopharyngeal Swab  Result Value Ref Range Status   SARS Coronavirus 2 NEGATIVE NEGATIVE Final    Comment: (NOTE) SARS-CoV-2 target nucleic acids are NOT DETECTED. The SARS-CoV-2 RNA is generally detectable in upper and lower respiratory specimens during the acute phase of infection. The lowest concentration of SARS-CoV-2 viral copies this assay can detect is 250 copies / mL. A negative result does not preclude SARS-CoV-2 infection and should not be used as the sole basis for treatment or other patient management decisions.  A negative result may occur with improper specimen collection / handling, submission of specimen other than nasopharyngeal swab, presence  of viral mutation(s) within the areas targeted by this assay, and inadequate number of viral copies (<250 copies / mL). A negative result must be combined with clinical observations, patient history, and epidemiological information. Fact Sheet for Patients:   StrictlyIdeas.no Fact Sheet for Healthcare Providers: BankingDealers.co.za This test is not yet approved or cleared  by the [place] FDA and has been authorized for detection and/or diagnosis of SARS-CoV-2 by FDA under an Emergency Use Authorization (EUA).  This EUA will remain in effect (meaning this test can be used) for the duration of the COVID-19 declaration  under Section 564(b)(1) of the Act, 21 U.S.C. section 360bbb-3(b)(1), unless the authorization is terminated or revoked sooner. Performed at [facility], [address], [place], [place] [address]      Discharge Instructions:   Discharge Instructions    Diet - low sodium heart healthy   Complete by: As directed    Face-to-face encounter (required for Medicare/Medicaid patients)   Complete by: As directed    I [facility] certify that this patient is under my care and that I, or a nurse practitioner or physician's assistant working with me, had a face-to-face encounter that meets the physician face-to-face encounter requirements with this patient on 07/09/2019. The encounter with the patient was in whole, or in part for the following medical condition(s) which is the primary reason for home health care (List medical condition): Left humerus and radius fracture   The encounter with the patient was in whole, or in part, for the following medical condition, which is the primary reason for home health care: Left humerus and radius fracture   I certify that, based on my findings, the following services are medically necessary home health services: Physical therapy   Reason for Medically Necessary Home Health Services: Therapy- Personnel officer, Public librarian   My clinical findings support the need for the above services:  Pain interferes with ambulation/mobility Unable to leave home safely without assistance and/or assistive device     Further, I certify that my clinical findings support that this patient is homebound due to:  Unable to leave home safely without assistance Pain interferes with ambulation/mobility     Home Health   Complete by: As directed    To provide the following care/treatments:  PT OT     Increase activity slowly   Complete by: As directed      Allergies as of 07/09/2019      Reactions   Actonel [risedronate Sodium]    Boniva  [ibandronic Acid]    Fosamax [alendronate]    Severe muscle aches   Lopid [gemfibrozil]    headache   Penicillins Other (See Comments)   (injectable only) - knot at site Has patient had a PCN reaction causing immediate rash, facial/tongue/throat swelling, SOB or lightheadedness with hypotension:NO Has patient had a PCN reaction causing severe rash involving mucus membranes or skin necrosis: No Has patient had a PCN reaction that required hospitalization: No Has patient had a PCN reaction occurring within the last 10 years: No If all of the above answers are "NO", then may proceed with Cephalosporin use.   Reclast [zoledronic Acid]    Severe muscle aches   Strawberry (diagnostic) Itching   Tape    Tears skin (tegaderm and paper tape OK)   Doxycycline Rash      Medication List    STOP taking these medications   bumetanide 2 MG tablet Commonly known as: BUMEX   oxyCODONE 5  MG immediate release tablet Commonly known as: Oxy IR/ROXICODONE   propranolol 20 MG tablet Commonly known as: INDERAL   traMADol 50 MG tablet Commonly known as: Ultram     TAKE these medications   acetaminophen 650 MG CR tablet Commonly known as: TYLENOL Take 650-1,300 mg by mouth every 8 (eight) hours as needed for pain.   albuterol 108 (90 Base) MCG/ACT inhaler Commonly known as: VENTOLIN HFA Inhale 2 puffs into the lungs every 6 (six) hours as needed for wheezing or shortness of breath.   Azelastine-Fluticasone 137-50 MCG/ACT Susp Place 1 spray into the nose 2 (two) times daily as needed (for allergies.).   busPIRone 7.5 MG tablet Commonly known as: BUSPAR Take 7.5 mg by mouth 2 (two) times daily.   CALCIUM PO Take 1 tablet by mouth daily.   Cholecalciferol 50 MCG (2000 UT) Caps Take 2,000 Units by mouth daily before breakfast.   diclofenac sodium 1 % Gel Commonly known as: VOLTAREN Apply 2-4 g topically 4 (four) times daily as needed (for pain.).   fenofibrate 160 MG tablet Take 160  mg by mouth daily at 12 noon.   gabapentin 300 MG capsule Commonly known as: NEURONTIN Take 600 mg by mouth 3 (three) times daily.   GLUCOSAMINE-CHONDROITIN PO Take 1 tablet by mouth daily.   HYDROcodone-acetaminophen 5-325 MG tablet Commonly known as: NORCO/VICODIN Take 1-2 tablets by mouth every 6 (six) hours as needed for moderate pain.   hydrocortisone 2.5 % cream Apply 1 application topically 2 (two) times daily as needed ((typically uses once daily)). Mix equal parts with ketoconazole 2% applied affected area(s) on face   ketoconazole 2 % cream Commonly known as: NIZORAL Apply 1 application topically 2 (two) times daily as needed for irritation ((typically uses once daily)). Mix equal parts with hydrocortisone 2.5% applied affected area(s) on face   montelukast 10 MG tablet Commonly known as: SINGULAIR Take 10 mg by mouth every morning.   Oyster Shell Calcium 500-400 MG-UNIT Tabs Take by mouth.   pantoprazole 40 MG tablet Commonly known as: PROTONIX Take 40 mg by mouth See admin instructions. Take 1 tablet (40 mg) scheduled every morning; may take additional dose later if needed   raloxifene 60 MG tablet Commonly known as: EVISTA Take 60 mg by mouth daily at 12 noon.   sertraline 100 MG tablet Commonly known as: ZOLOFT Take 200 mg by mouth daily with lunch.   tiZANidine 4 MG tablet Commonly known as: ZANAFLEX Take 4 mg by mouth 2 (two) times daily.            Durable Medical Equipment  (From admission, onward)         Start     Ordered   07/09/19 1102  DME Oxygen  Once    Question Answer Comment  Length of Need Lifetime   Mode or (Route) Nasal cannula   Liters per Minute 2   Frequency Continuous (stationary and portable oxygen unit needed)   Oxygen delivery system Gas      07/09/19 1101            Time coordinating discharge: 35 minutes  Signed:  Derry Kassel  [facility] 07/09/2019, 11:04 AM

## 2019-07-09 NOTE — Progress Notes (Signed)
Received MD order to discharge patient to home with home health, reviewed discharge instructions, prescriptions, homes meds and follow up appointments with patient and patient verbalized understanding

## 2019-07-09 NOTE — Care Management Important Message (Signed)
Important Message  Patient Details  Name: Annette Quinn MRN: [id number] Date of Birth: 1950/05/04   Medicare Important Message Given:  Yes     [name] 07/09/2019, 12:38 PM

## 2019-07-09 NOTE — Progress Notes (Signed)
Physical Therapy Treatment Patient Details Name: Annette Quinn MRN: [id number] DOB: 05/22/1950 Today's Date: 07/09/2019    History of Present Illness Annette Quinn is a 19yoF who comes to [facility] on 5/[facility] after a fall. Pt was bending over to pick up soem packages at home and fell down 6 stairs. PMH: peripheral neuropathy, meralgia paresthetica, essential tremor, unsteadiness, falls. work-up revealed laceration to the left side of her head that was sutured, as well as displaced left distal humerus fracture and distal both bone forearm fracture. In 2018 pt sustained a fall up 2 stairs at home   Lt humeral fracture converted to a rTSA.    PT Comments     Pt in bed and reports that she is needing her pain pill soon. Asking if she is to go home today.  Husband in room.  Stated she sleeps in lift chair at home and has [facility] and plans to use rollator as wheelchair for mobility needs and Probation officer educated that it is not a safe plan as they are not meant for that purpose.  5 steps with 1 rail into home.    Patient performs BLE exercises and then is assisted to EOB with rail and mod a x 1.  Sitting steady with no LOB at edge of bed. MD enters the room and begins to ask her questions about her medicine and listens to her heart and assesses her LE ankle swelling.   Writer discussed at length mobility and discharge.  SNF recommended, patient continues to want to discharge home, ambulance transfer home would be appropriate.  A wheelchair is needed as she is unable to walk household distances at this time.  She has a rollator and plans to use that for for seated mobility as stated above but education provided on it's intended use and safety using it as a wheelchair.  Both voiced understanding.  She also stated she has an MD appt in office on Tuesday.  Discussed how she would get in/out of home for that appointment and she stated she was unsure.  Ambulance transfer to/from would also be recommended for that appointment if  they chose to go home.   Pt and husband have decided she wants to go home.  All questions answered.     Follow Up Recommendations  SNF     Equipment Recommendations  Wheelchair cushion (measurements PT)    Recommendations for Other Services       Precautions / Restrictions Precautions Precautions: Fall Required Braces or Orthoses: Sling;Splint/Cast Splint/Cast: LUE splint (donned at entry) total LUE Restrictions Weight Bearing Restrictions: Yes LUE Weight Bearing: Non weight bearing    Mobility  Bed Mobility Overal bed mobility: Needs Assistance Bed Mobility: Supine to Sit;Sit to Supine     Supine to sit: Mod assist Sit to supine: (needs HOB raised)   General bed mobility comments: sleeps in lift chair at home  Transfers Overall transfer level: (unable due to need of 2 person assist.)                  Ambulation/Gait Ambulation/Gait assistance: (unable due to need of + 2 assist)               Stairs             Wheelchair Mobility    Modified Rankin (Stroke Patients Only)       Balance Overall balance assessment: Needs assistance Sitting-balance support: No upper extremity supported;Feet supported Sitting balance-Leahy Scale: Good  Cognition Arousal/Alertness: Awake/alert Behavior During Therapy: WFL for tasks assessed/performed Overall Cognitive Status: Within Functional Limits for tasks assessed                                        Exercises General Exercises - Lower Extremity Ankle Circles/Pumps: Strengthening;AROM;Both;10 reps;Supine Gluteal Sets: Strengthening;Both;15 reps;Seated Long Arc Quad: Strengthening;Both;15 reps;Seated Hip Flexion/Marching: Strengthening;Both;15 reps;Seated Toe Raises: Strengthening;Both;15 reps;Seated Heel Raises: Strengthening;Both;15 reps;Seated    General Comments        Pertinent Vitals/Pain Pain Assessment:  0-10 Pain Score: 9  Pain Location: (left shoulder) Pain Intervention(s): Limited activity within patient's tolerance;Monitored during session    Home Living                      Prior Function            PT Goals (current goals can now be found in the care plan section) Progress towards PT goals: Progressing toward goals    Frequency    7X/week      PT Plan Current plan remains appropriate    Co-evaluation              AM-PAC PT "6 Clicks" Mobility   Outcome Measure  Help needed turning from your back to your side while in a flat bed without using bedrails?: Total Help needed moving from lying on your back to sitting on the side of a flat bed without using bedrails?: Total Help needed moving to and from a bed to a chair (including a wheelchair)?: A Lot Help needed standing up from a chair using your arms (e.g., wheelchair or bedside chair)?: A Lot Help needed to walk in hospital room?: A Lot Help needed climbing 3-5 steps with a railing? : Total 6 Click Score: 9    End of Session Equipment Utilized During Treatment: Oxygen Activity Tolerance: Patient tolerated treatment well Patient left: in chair;with call bell/phone within reach;with family/visitor present Nurse Communication: Mobility status       Time: 0930-1009 PT Time Calculation (min) (ACUTE ONLY): 39 min  Charges:  $Therapeutic Exercise: 8-22 mins $Therapeutic Activity: 23-37 mins                       Alanson Puls, PT DPT 07/09/2019, 10:18 AM

## 2019-07-09 NOTE — TOC Initial Note (Signed)
Transition of Care [facility]) - Initial/Assessment Note    Patient Details  Name: Annette Quinn MRN: [id number] Date of Birth: 1950/02/13  Transition of Care [facility]) CM/SW Contact:    Su Hilt, RN Phone Number: 07/09/2019, 9:30 AM  Clinical Narrative:                 Met with the patient and her husband at the bedside to discuss DC plan and needs, She refuses to go to SNF and wants to go home with Home health.  She lives at home with her husband She has used [facility] in the past and wants to use them again, I contacted Helene Kelp with [facility] and they are accepting.  She has a rolling walker and a rolator at home but needs a 3 in 1, I contacted Zack with [employer] and took the 3 in 1 into the room for the patient to take home, She stated she needs Oxygen, I requested a walking O2 sat to be done., I explained to the patient without a Chronic respiratory diagnosis she would not qualify for insurance to cover it but she could pay for it out of pocket if she chooses. She stated understanding.   Expected Discharge Plan: [place] Barriers to Discharge: Continued Medical Work up   Patient Goals and CMS Choice Patient states their goals for this hospitalization and ongoing recovery are:: go home with Physical therapy home health      Expected Discharge Plan and Services Expected Discharge Plan: [place]   Discharge Planning Services: CM Consult   Living arrangements for the past 2 months: Single Family Home                 DME Arranged: 3-N-1 DME Agency: [employer] Date DME Agency Contacted: 07/09/19 Time DME Agency Contacted: [phone] Representative spoke with at DME Agency: ZACK HH Arranged: PT [place]: [facility] [employer] [facility] [employer] Date [place]: 07/09/19 Time [place]: [phone] Representative spoke with at [place]: [place] Arrangements/Services Living arrangements for the past 2 months: [place] Lives with:: Spouse Patient language and need for interpreter reviewed:: No Do you feel safe going back to the place where you live?: Yes      Need for Family Participation in Patient Care: No (Comment) Care giver support system in place?: Yes (comment) Current home services: DME(rolling walker and rolator) Criminal Activity/Legal Involvement Pertinent to Current Situation/Hospitalization: No - Comment as needed  Activities of Daily Living Home Assistive Devices/Equipment: Cane (specify quad or straight), Walker (specify type) ADL Screening (condition at time of admission) Patient's cognitive ability adequate to safely complete daily activities?: Yes Is the patient deaf or have difficulty hearing?: No Does the patient have difficulty seeing, even when wearing glasses/contacts?: No Does the patient have difficulty concentrating, remembering, or making decisions?: No Patient able to express need for assistance with ADLs?: Yes Does the patient have difficulty dressing or bathing?: Yes Independently performs ADLs?: No Communication: Independent Dressing (OT): Needs assistance Is this a change from baseline?: Change from baseline, expected to last <3days Grooming: Needs assistance Is this a change from baseline?: Change from baseline, expected to last <3 days Feeding: Independent Bathing: Needs assistance Is this a change from baseline?: Change from baseline, expected to last <3 days Toileting: Needs assistance Is this a change from baseline?: Change from baseline, expected to last <3 days In/Out Bed: Needs assistance Is this a change  from baseline?: Change from baseline, expected to last <3 days Walks in Home: Independent Does the patient have difficulty walking or climbing stairs?: Yes Weakness of Legs: None Weakness of Arms/Hands: None  Permission Sought/Granted Permission sought to share information with : Chartered certified accountant granted to share  information with : Yes, Verbal Permission Granted     Permission granted to share info w AGENCY: [facility] or other agency        Emotional Assessment Appearance:: Appears stated age Attitude/Demeanor/Rapport: Engaged Affect (typically observed): Appropriate Orientation: : Oriented to Self, Oriented to Place, Oriented to  Time, Oriented to Situation Alcohol / Substance Use: Not Applicable Psych Involvement: No (comment)  Admission diagnosis:  Fracture [T14.8XXA] Acute respiratory failure with hypoxia (HCC) [J96.01] Minor head injury, initial encounter [S09.90XA] Laceration of scalp, initial encounter [S01.01XA] Closed fracture of distal end of left radius, unspecified fracture morphology, initial encounter [S52.502A] Other closed displaced fracture of distal end of left humerus, initial encounter [S42.492A] Fracture of distal humerus [S42.409A] Patient Active Problem List   Diagnosis Date Noted  . Fall down steps 07/06/2019  . Fracture of distal humerus 07/06/2019  . Accidental fall 07/05/2019  . Peripheral neuropathy 07/05/2019  . Benign essential tremor 07/05/2019  . Obstructive sleep apnea 07/05/2019  . Fracture of distal end of left humerus 07/05/2019  . Closed fracture of distal end of left radius 07/05/2019  . Acute respiratory failure with hypoxia ([place]) 07/05/2019  . Personal history of fall 07/05/2019  . Wrist fracture, closed, left, initial encounter 07/05/2019  . Scalp laceration, initial encounter 07/05/2019  . Status post reverse total shoulder replacement, left 01/20/2017   PCP:  Kirk Ruths, MD Pharmacy:   [id number] [place] [place] [address]Phone: [phone]: 3[id number] [place] [address]Phone: [phone]: [phone]    Social Determinants of Health  (SDOH) Interventions    Readmission Risk Interventions No flowsheet data found.

## 2019-07-10 DIAGNOSIS — S42402A Unspecified fracture of lower end of left humerus, initial encounter for closed fracture: Secondary | ICD-10-CM | POA: Diagnosis not present

## 2019-07-11 DIAGNOSIS — Z20822 Contact with and (suspected) exposure to covid-19: Secondary | ICD-10-CM | POA: Diagnosis not present

## 2019-07-11 DIAGNOSIS — Z01812 Encounter for preprocedural laboratory examination: Secondary | ICD-10-CM | POA: Diagnosis not present

## 2019-07-12 DIAGNOSIS — J449 Chronic obstructive pulmonary disease, unspecified: Secondary | ICD-10-CM | POA: Diagnosis not present

## 2019-07-13 DIAGNOSIS — G5622 Lesion of ulnar nerve, left upper limb: Secondary | ICD-10-CM | POA: Diagnosis not present

## 2019-07-13 DIAGNOSIS — I10 Essential (primary) hypertension: Secondary | ICD-10-CM | POA: Diagnosis not present

## 2019-07-13 DIAGNOSIS — R0902 Hypoxemia: Secondary | ICD-10-CM | POA: Diagnosis not present

## 2019-07-13 DIAGNOSIS — S52392A Other fracture of shaft of radius, left arm, initial encounter for closed fracture: Secondary | ICD-10-CM | POA: Diagnosis not present

## 2019-07-13 DIAGNOSIS — G8918 Other acute postprocedural pain: Secondary | ICD-10-CM | POA: Diagnosis not present

## 2019-07-13 DIAGNOSIS — S52692A Other fracture of lower end of left ulna, initial encounter for closed fracture: Secondary | ICD-10-CM | POA: Diagnosis not present

## 2019-07-13 DIAGNOSIS — M79601 Pain in right arm: Secondary | ICD-10-CM | POA: Diagnosis not present

## 2019-07-13 DIAGNOSIS — S52602A Unspecified fracture of lower end of left ulna, initial encounter for closed fracture: Secondary | ICD-10-CM | POA: Diagnosis not present

## 2019-07-13 DIAGNOSIS — I129 Hypertensive chronic kidney disease with stage 1 through stage 4 chronic kidney disease, or unspecified chronic kidney disease: Secondary | ICD-10-CM | POA: Diagnosis not present

## 2019-07-13 DIAGNOSIS — S52202A Unspecified fracture of shaft of left ulna, initial encounter for closed fracture: Secondary | ICD-10-CM | POA: Diagnosis not present

## 2019-07-13 DIAGNOSIS — G4733 Obstructive sleep apnea (adult) (pediatric): Secondary | ICD-10-CM | POA: Diagnosis not present

## 2019-07-13 DIAGNOSIS — E785 Hyperlipidemia, unspecified: Secondary | ICD-10-CM | POA: Diagnosis not present

## 2019-07-13 DIAGNOSIS — S42402A Unspecified fracture of lower end of left humerus, initial encounter for closed fracture: Secondary | ICD-10-CM | POA: Diagnosis not present

## 2019-07-13 DIAGNOSIS — S53412A Radiohumeral (joint) sprain of left elbow, initial encounter: Secondary | ICD-10-CM | POA: Diagnosis not present

## 2019-07-13 DIAGNOSIS — S52502A Unspecified fracture of the lower end of left radius, initial encounter for closed fracture: Secondary | ICD-10-CM | POA: Diagnosis not present

## 2019-07-13 DIAGNOSIS — N189 Chronic kidney disease, unspecified: Secondary | ICD-10-CM | POA: Diagnosis not present

## 2019-07-14 DIAGNOSIS — S52502A Unspecified fracture of the lower end of left radius, initial encounter for closed fracture: Secondary | ICD-10-CM | POA: Diagnosis not present

## 2019-07-14 DIAGNOSIS — S42402A Unspecified fracture of lower end of left humerus, initial encounter for closed fracture: Secondary | ICD-10-CM | POA: Diagnosis not present

## 2019-07-14 DIAGNOSIS — S53412A Radiohumeral (joint) sprain of left elbow, initial encounter: Secondary | ICD-10-CM | POA: Diagnosis not present

## 2019-07-14 DIAGNOSIS — I129 Hypertensive chronic kidney disease with stage 1 through stage 4 chronic kidney disease, or unspecified chronic kidney disease: Secondary | ICD-10-CM | POA: Diagnosis not present

## 2019-07-14 DIAGNOSIS — G5622 Lesion of ulnar nerve, left upper limb: Secondary | ICD-10-CM | POA: Diagnosis not present

## 2019-07-14 DIAGNOSIS — R0902 Hypoxemia: Secondary | ICD-10-CM | POA: Diagnosis not present

## 2019-07-14 DIAGNOSIS — S52202A Unspecified fracture of shaft of left ulna, initial encounter for closed fracture: Secondary | ICD-10-CM | POA: Diagnosis not present

## 2019-07-14 DIAGNOSIS — G4733 Obstructive sleep apnea (adult) (pediatric): Secondary | ICD-10-CM | POA: Diagnosis not present

## 2019-07-14 DIAGNOSIS — E785 Hyperlipidemia, unspecified: Secondary | ICD-10-CM | POA: Diagnosis not present

## 2019-07-19 DIAGNOSIS — S52202D Unspecified fracture of shaft of left ulna, subsequent encounter for closed fracture with routine healing: Secondary | ICD-10-CM | POA: Diagnosis not present

## 2019-07-19 DIAGNOSIS — S42402D Unspecified fracture of lower end of left humerus, subsequent encounter for fracture with routine healing: Secondary | ICD-10-CM | POA: Diagnosis not present

## 2019-07-19 DIAGNOSIS — S52502D Unspecified fracture of the lower end of left radius, subsequent encounter for closed fracture with routine healing: Secondary | ICD-10-CM | POA: Diagnosis not present

## 2019-07-27 DIAGNOSIS — Z09 Encounter for follow-up examination after completed treatment for conditions other than malignant neoplasm: Secondary | ICD-10-CM | POA: Diagnosis not present

## 2019-07-27 DIAGNOSIS — N1831 Chronic kidney disease, stage 3a: Secondary | ICD-10-CM | POA: Diagnosis not present

## 2019-07-27 DIAGNOSIS — S42402D Unspecified fracture of lower end of left humerus, subsequent encounter for fracture with routine healing: Secondary | ICD-10-CM | POA: Diagnosis not present

## 2019-07-27 DIAGNOSIS — E781 Pure hyperglyceridemia: Secondary | ICD-10-CM | POA: Diagnosis not present

## 2019-07-27 DIAGNOSIS — S52502D Unspecified fracture of the lower end of left radius, subsequent encounter for closed fracture with routine healing: Secondary | ICD-10-CM | POA: Diagnosis not present

## 2019-07-27 DIAGNOSIS — Z0000 Encounter for general adult medical examination without abnormal findings: Secondary | ICD-10-CM | POA: Diagnosis not present

## 2019-07-27 DIAGNOSIS — S52202D Unspecified fracture of shaft of left ulna, subsequent encounter for closed fracture with routine healing: Secondary | ICD-10-CM | POA: Diagnosis not present

## 2019-07-27 DIAGNOSIS — Z131 Encounter for screening for diabetes mellitus: Secondary | ICD-10-CM | POA: Diagnosis not present

## 2019-08-01 DIAGNOSIS — E781 Pure hyperglyceridemia: Secondary | ICD-10-CM | POA: Diagnosis not present

## 2019-08-01 DIAGNOSIS — Z87891 Personal history of nicotine dependence: Secondary | ICD-10-CM | POA: Diagnosis not present

## 2019-08-01 DIAGNOSIS — F325 Major depressive disorder, single episode, in full remission: Secondary | ICD-10-CM | POA: Diagnosis not present

## 2019-08-01 DIAGNOSIS — N1831 Chronic kidney disease, stage 3a: Secondary | ICD-10-CM | POA: Diagnosis not present

## 2019-08-01 DIAGNOSIS — R42 Dizziness and giddiness: Secondary | ICD-10-CM | POA: Diagnosis not present

## 2019-08-01 DIAGNOSIS — G4733 Obstructive sleep apnea (adult) (pediatric): Secondary | ICD-10-CM | POA: Diagnosis not present

## 2019-08-02 DIAGNOSIS — S52202D Unspecified fracture of shaft of left ulna, subsequent encounter for closed fracture with routine healing: Secondary | ICD-10-CM | POA: Diagnosis not present

## 2019-08-02 DIAGNOSIS — S52502D Unspecified fracture of the lower end of left radius, subsequent encounter for closed fracture with routine healing: Secondary | ICD-10-CM | POA: Diagnosis not present

## 2019-08-02 DIAGNOSIS — S42402D Unspecified fracture of lower end of left humerus, subsequent encounter for fracture with routine healing: Secondary | ICD-10-CM | POA: Diagnosis not present

## 2019-08-10 DIAGNOSIS — S52502D Unspecified fracture of the lower end of left radius, subsequent encounter for closed fracture with routine healing: Secondary | ICD-10-CM | POA: Diagnosis not present

## 2019-08-10 DIAGNOSIS — S42402D Unspecified fracture of lower end of left humerus, subsequent encounter for fracture with routine healing: Secondary | ICD-10-CM | POA: Diagnosis not present

## 2019-08-10 DIAGNOSIS — S52202D Unspecified fracture of shaft of left ulna, subsequent encounter for closed fracture with routine healing: Secondary | ICD-10-CM | POA: Diagnosis not present

## 2019-08-21 ENCOUNTER — Other Ambulatory Visit: Payer: Self-pay | Admitting: Otolaryngology

## 2019-08-21 DIAGNOSIS — R42 Dizziness and giddiness: Secondary | ICD-10-CM | POA: Diagnosis not present

## 2019-08-23 DIAGNOSIS — M79675 Pain in left toe(s): Secondary | ICD-10-CM | POA: Diagnosis not present

## 2019-08-23 DIAGNOSIS — M79674 Pain in right toe(s): Secondary | ICD-10-CM | POA: Diagnosis not present

## 2019-08-23 DIAGNOSIS — S52202D Unspecified fracture of shaft of left ulna, subsequent encounter for closed fracture with routine healing: Secondary | ICD-10-CM | POA: Diagnosis not present

## 2019-08-23 DIAGNOSIS — B351 Tinea unguium: Secondary | ICD-10-CM | POA: Diagnosis not present

## 2019-08-23 DIAGNOSIS — S42402D Unspecified fracture of lower end of left humerus, subsequent encounter for fracture with routine healing: Secondary | ICD-10-CM | POA: Diagnosis not present

## 2019-08-23 DIAGNOSIS — I87303 Chronic venous hypertension (idiopathic) without complications of bilateral lower extremity: Secondary | ICD-10-CM | POA: Diagnosis not present

## 2019-08-23 DIAGNOSIS — S52502D Unspecified fracture of the lower end of left radius, subsequent encounter for closed fracture with routine healing: Secondary | ICD-10-CM | POA: Diagnosis not present

## 2019-08-24 DIAGNOSIS — M47816 Spondylosis without myelopathy or radiculopathy, lumbar region: Secondary | ICD-10-CM | POA: Diagnosis not present

## 2019-08-24 DIAGNOSIS — M62830 Muscle spasm of back: Secondary | ICD-10-CM | POA: Diagnosis not present

## 2019-08-24 DIAGNOSIS — M5136 Other intervertebral disc degeneration, lumbar region: Secondary | ICD-10-CM | POA: Diagnosis not present

## 2019-08-28 DIAGNOSIS — S42402D Unspecified fracture of lower end of left humerus, subsequent encounter for fracture with routine healing: Secondary | ICD-10-CM | POA: Diagnosis not present

## 2019-08-30 ENCOUNTER — Other Ambulatory Visit: Payer: Self-pay

## 2019-08-30 ENCOUNTER — Ambulatory Visit
Admission: RE | Admit: 2019-08-30 | Discharge: 2019-08-30 | Disposition: A | Discharge status: Home / Self Care | Payer: Medicare HMO | Source: Ambulatory Visit | Attending: Otolaryngology | Admitting: Otolaryngology

## 2019-08-30 DIAGNOSIS — I6523 Occlusion and stenosis of bilateral carotid arteries: Secondary | ICD-10-CM | POA: Diagnosis not present

## 2019-08-30 DIAGNOSIS — R42 Dizziness and giddiness: Secondary | ICD-10-CM | POA: Insufficient documentation

## 2019-09-03 DIAGNOSIS — S42402D Unspecified fracture of lower end of left humerus, subsequent encounter for fracture with routine healing: Secondary | ICD-10-CM | POA: Diagnosis not present

## 2019-09-06 DIAGNOSIS — S52202D Unspecified fracture of shaft of left ulna, subsequent encounter for closed fracture with routine healing: Secondary | ICD-10-CM | POA: Diagnosis not present

## 2019-09-06 DIAGNOSIS — S42402D Unspecified fracture of lower end of left humerus, subsequent encounter for fracture with routine healing: Secondary | ICD-10-CM | POA: Diagnosis not present

## 2019-09-06 DIAGNOSIS — S52502D Unspecified fracture of the lower end of left radius, subsequent encounter for closed fracture with routine healing: Secondary | ICD-10-CM | POA: Diagnosis not present

## 2019-09-10 DIAGNOSIS — C44719 Basal cell carcinoma of skin of left lower limb, including hip: Secondary | ICD-10-CM | POA: Diagnosis not present

## 2019-09-10 DIAGNOSIS — L570 Actinic keratosis: Secondary | ICD-10-CM | POA: Diagnosis not present

## 2019-10-03 DIAGNOSIS — G5622 Lesion of ulnar nerve, left upper limb: Secondary | ICD-10-CM | POA: Diagnosis not present

## 2019-10-03 DIAGNOSIS — G250 Essential tremor: Secondary | ICD-10-CM | POA: Diagnosis not present

## 2019-10-03 DIAGNOSIS — G4733 Obstructive sleep apnea (adult) (pediatric): Secondary | ICD-10-CM | POA: Diagnosis not present

## 2019-10-03 DIAGNOSIS — R2689 Other abnormalities of gait and mobility: Secondary | ICD-10-CM | POA: Diagnosis not present

## 2019-10-03 DIAGNOSIS — G5711 Meralgia paresthetica, right lower limb: Secondary | ICD-10-CM | POA: Diagnosis not present

## 2019-10-09 DIAGNOSIS — S42402D Unspecified fracture of lower end of left humerus, subsequent encounter for fracture with routine healing: Secondary | ICD-10-CM | POA: Diagnosis not present

## 2019-10-17 DIAGNOSIS — Z01818 Encounter for other preprocedural examination: Secondary | ICD-10-CM | POA: Diagnosis not present

## 2019-10-19 DIAGNOSIS — S42472D Displaced transcondylar fracture of left humerus, subsequent encounter for fracture with routine healing: Secondary | ICD-10-CM | POA: Diagnosis not present

## 2019-10-19 DIAGNOSIS — N183 Chronic kidney disease, stage 3 unspecified: Secondary | ICD-10-CM | POA: Diagnosis not present

## 2019-10-19 DIAGNOSIS — F329 Major depressive disorder, single episode, unspecified: Secondary | ICD-10-CM | POA: Diagnosis not present

## 2019-10-19 DIAGNOSIS — S42402D Unspecified fracture of lower end of left humerus, subsequent encounter for fracture with routine healing: Secondary | ICD-10-CM | POA: Diagnosis not present

## 2019-10-19 DIAGNOSIS — E7800 Pure hypercholesterolemia, unspecified: Secondary | ICD-10-CM | POA: Diagnosis not present

## 2019-10-19 DIAGNOSIS — F419 Anxiety disorder, unspecified: Secondary | ICD-10-CM | POA: Diagnosis not present

## 2019-10-19 DIAGNOSIS — M25532 Pain in left wrist: Secondary | ICD-10-CM | POA: Diagnosis not present

## 2019-10-19 DIAGNOSIS — Z472 Encounter for removal of internal fixation device: Secondary | ICD-10-CM | POA: Diagnosis not present

## 2019-10-19 DIAGNOSIS — M810 Age-related osteoporosis without current pathological fracture: Secondary | ICD-10-CM | POA: Diagnosis not present

## 2019-10-19 DIAGNOSIS — K219 Gastro-esophageal reflux disease without esophagitis: Secondary | ICD-10-CM | POA: Diagnosis not present

## 2019-10-19 DIAGNOSIS — G4733 Obstructive sleep apnea (adult) (pediatric): Secondary | ICD-10-CM | POA: Diagnosis not present

## 2019-10-19 DIAGNOSIS — G8918 Other acute postprocedural pain: Secondary | ICD-10-CM | POA: Diagnosis not present

## 2019-10-19 DIAGNOSIS — L409 Psoriasis, unspecified: Secondary | ICD-10-CM | POA: Diagnosis not present

## 2019-10-30 DIAGNOSIS — S42402D Unspecified fracture of lower end of left humerus, subsequent encounter for fracture with routine healing: Secondary | ICD-10-CM | POA: Diagnosis not present

## 2019-10-30 DIAGNOSIS — S52202D Unspecified fracture of shaft of left ulna, subsequent encounter for closed fracture with routine healing: Secondary | ICD-10-CM | POA: Diagnosis not present

## 2019-10-30 DIAGNOSIS — S52502D Unspecified fracture of the lower end of left radius, subsequent encounter for closed fracture with routine healing: Secondary | ICD-10-CM | POA: Diagnosis not present

## 2019-11-01 DIAGNOSIS — Z09 Encounter for follow-up examination after completed treatment for conditions other than malignant neoplasm: Secondary | ICD-10-CM | POA: Diagnosis not present

## 2019-11-01 DIAGNOSIS — S42402A Unspecified fracture of lower end of left humerus, initial encounter for closed fracture: Secondary | ICD-10-CM | POA: Diagnosis not present

## 2019-11-01 DIAGNOSIS — S42402D Unspecified fracture of lower end of left humerus, subsequent encounter for fracture with routine healing: Secondary | ICD-10-CM | POA: Diagnosis not present

## 2019-11-01 DIAGNOSIS — S52502D Unspecified fracture of the lower end of left radius, subsequent encounter for closed fracture with routine healing: Secondary | ICD-10-CM | POA: Diagnosis not present

## 2019-11-01 DIAGNOSIS — S52202D Unspecified fracture of shaft of left ulna, subsequent encounter for closed fracture with routine healing: Secondary | ICD-10-CM | POA: Diagnosis not present

## 2019-11-08 DIAGNOSIS — S52202D Unspecified fracture of shaft of left ulna, subsequent encounter for closed fracture with routine healing: Secondary | ICD-10-CM | POA: Diagnosis not present

## 2019-11-08 DIAGNOSIS — S52502D Unspecified fracture of the lower end of left radius, subsequent encounter for closed fracture with routine healing: Secondary | ICD-10-CM | POA: Diagnosis not present

## 2019-11-08 DIAGNOSIS — S42402D Unspecified fracture of lower end of left humerus, subsequent encounter for fracture with routine healing: Secondary | ICD-10-CM | POA: Diagnosis not present

## 2019-11-13 DIAGNOSIS — D369 Benign neoplasm, unspecified site: Secondary | ICD-10-CM | POA: Diagnosis not present

## 2019-11-13 DIAGNOSIS — K591 Functional diarrhea: Secondary | ICD-10-CM | POA: Diagnosis not present

## 2019-11-15 DIAGNOSIS — S42402D Unspecified fracture of lower end of left humerus, subsequent encounter for fracture with routine healing: Secondary | ICD-10-CM | POA: Diagnosis not present

## 2019-11-15 DIAGNOSIS — S52202D Unspecified fracture of shaft of left ulna, subsequent encounter for closed fracture with routine healing: Secondary | ICD-10-CM | POA: Diagnosis not present

## 2019-11-15 DIAGNOSIS — S52502D Unspecified fracture of the lower end of left radius, subsequent encounter for closed fracture with routine healing: Secondary | ICD-10-CM | POA: Diagnosis not present

## 2019-11-29 DIAGNOSIS — S52202D Unspecified fracture of shaft of left ulna, subsequent encounter for closed fracture with routine healing: Secondary | ICD-10-CM | POA: Diagnosis not present

## 2019-11-29 DIAGNOSIS — S42402D Unspecified fracture of lower end of left humerus, subsequent encounter for fracture with routine healing: Secondary | ICD-10-CM | POA: Diagnosis not present

## 2019-11-29 DIAGNOSIS — S52502D Unspecified fracture of the lower end of left radius, subsequent encounter for closed fracture with routine healing: Secondary | ICD-10-CM | POA: Diagnosis not present

## 2019-12-04 DIAGNOSIS — S42402D Unspecified fracture of lower end of left humerus, subsequent encounter for fracture with routine healing: Secondary | ICD-10-CM | POA: Diagnosis not present

## 2019-12-04 DIAGNOSIS — S52502D Unspecified fracture of the lower end of left radius, subsequent encounter for closed fracture with routine healing: Secondary | ICD-10-CM | POA: Diagnosis not present

## 2019-12-04 DIAGNOSIS — S52202D Unspecified fracture of shaft of left ulna, subsequent encounter for closed fracture with routine healing: Secondary | ICD-10-CM | POA: Diagnosis not present

## 2019-12-10 DIAGNOSIS — C44719 Basal cell carcinoma of skin of left lower limb, including hip: Secondary | ICD-10-CM | POA: Diagnosis not present

## 2019-12-10 DIAGNOSIS — L218 Other seborrheic dermatitis: Secondary | ICD-10-CM | POA: Diagnosis not present

## 2019-12-10 DIAGNOSIS — L821 Other seborrheic keratosis: Secondary | ICD-10-CM | POA: Diagnosis not present

## 2019-12-10 DIAGNOSIS — L565 Disseminated superficial actinic porokeratosis (DSAP): Secondary | ICD-10-CM | POA: Diagnosis not present

## 2019-12-11 ENCOUNTER — Other Ambulatory Visit (HOSPITAL_COMMUNITY): Payer: Self-pay | Admitting: Orthopaedic Surgery

## 2019-12-11 ENCOUNTER — Other Ambulatory Visit: Payer: Self-pay | Admitting: Orthopaedic Surgery

## 2019-12-11 DIAGNOSIS — S42402D Unspecified fracture of lower end of left humerus, subsequent encounter for fracture with routine healing: Secondary | ICD-10-CM

## 2019-12-12 ENCOUNTER — Other Ambulatory Visit: Payer: Self-pay | Admitting: Internal Medicine

## 2019-12-12 DIAGNOSIS — Z1231 Encounter for screening mammogram for malignant neoplasm of breast: Secondary | ICD-10-CM

## 2019-12-26 ENCOUNTER — Ambulatory Visit
Admission: RE | Admit: 2019-12-26 | Discharge: 2019-12-26 | Disposition: A | Discharge status: Home / Self Care | Payer: Medicare HMO | Source: Ambulatory Visit | Attending: Orthopaedic Surgery | Admitting: Orthopaedic Surgery

## 2019-12-26 ENCOUNTER — Other Ambulatory Visit: Payer: Self-pay

## 2019-12-26 DIAGNOSIS — S42402A Unspecified fracture of lower end of left humerus, initial encounter for closed fracture: Secondary | ICD-10-CM | POA: Diagnosis not present

## 2019-12-26 DIAGNOSIS — S42402D Unspecified fracture of lower end of left humerus, subsequent encounter for fracture with routine healing: Secondary | ICD-10-CM | POA: Insufficient documentation

## 2019-12-26 DIAGNOSIS — M19022 Primary osteoarthritis, left elbow: Secondary | ICD-10-CM | POA: Diagnosis not present

## 2020-01-01 DIAGNOSIS — H524 Presbyopia: Secondary | ICD-10-CM | POA: Diagnosis not present

## 2020-01-01 DIAGNOSIS — Z0100 Encounter for examination of eyes and vision without abnormal findings: Secondary | ICD-10-CM | POA: Diagnosis not present

## 2020-01-10 DIAGNOSIS — S52202D Unspecified fracture of shaft of left ulna, subsequent encounter for closed fracture with routine healing: Secondary | ICD-10-CM | POA: Diagnosis not present

## 2020-01-10 DIAGNOSIS — S42402D Unspecified fracture of lower end of left humerus, subsequent encounter for fracture with routine healing: Secondary | ICD-10-CM | POA: Diagnosis not present

## 2020-01-10 DIAGNOSIS — S52502D Unspecified fracture of the lower end of left radius, subsequent encounter for closed fracture with routine healing: Secondary | ICD-10-CM | POA: Diagnosis not present

## 2020-01-18 ENCOUNTER — Other Ambulatory Visit: Payer: Self-pay

## 2020-01-18 ENCOUNTER — Ambulatory Visit
Admission: RE | Admit: 2020-01-18 | Discharge: 2020-01-18 | Disposition: A | Discharge status: Home / Self Care | Payer: Medicare HMO | Source: Ambulatory Visit | Attending: Internal Medicine | Admitting: Internal Medicine

## 2020-01-18 DIAGNOSIS — Z1231 Encounter for screening mammogram for malignant neoplasm of breast: Secondary | ICD-10-CM | POA: Diagnosis not present

## 2020-01-24 DIAGNOSIS — N1831 Chronic kidney disease, stage 3a: Secondary | ICD-10-CM | POA: Diagnosis not present

## 2020-01-24 DIAGNOSIS — E781 Pure hyperglyceridemia: Secondary | ICD-10-CM | POA: Diagnosis not present

## 2020-01-31 DIAGNOSIS — E781 Pure hyperglyceridemia: Secondary | ICD-10-CM | POA: Diagnosis not present

## 2020-01-31 DIAGNOSIS — N1831 Chronic kidney disease, stage 3a: Secondary | ICD-10-CM | POA: Diagnosis not present

## 2020-01-31 DIAGNOSIS — Z0000 Encounter for general adult medical examination without abnormal findings: Secondary | ICD-10-CM | POA: Diagnosis not present

## 2020-01-31 DIAGNOSIS — F325 Major depressive disorder, single episode, in full remission: Secondary | ICD-10-CM | POA: Diagnosis not present

## 2020-01-31 DIAGNOSIS — G4733 Obstructive sleep apnea (adult) (pediatric): Secondary | ICD-10-CM | POA: Diagnosis not present

## 2020-02-19 DIAGNOSIS — H26493 Other secondary cataract, bilateral: Secondary | ICD-10-CM | POA: Diagnosis not present

## 2020-02-19 DIAGNOSIS — H26492 Other secondary cataract, left eye: Secondary | ICD-10-CM | POA: Diagnosis not present

## 2020-02-19 DIAGNOSIS — Z961 Presence of intraocular lens: Secondary | ICD-10-CM | POA: Diagnosis not present

## 2020-02-19 DIAGNOSIS — H02831 Dermatochalasis of right upper eyelid: Secondary | ICD-10-CM | POA: Diagnosis not present

## 2020-02-19 DIAGNOSIS — H18413 Arcus senilis, bilateral: Secondary | ICD-10-CM | POA: Diagnosis not present

## 2020-02-20 DIAGNOSIS — Z96612 Presence of left artificial shoulder joint: Secondary | ICD-10-CM | POA: Diagnosis not present

## 2020-02-20 DIAGNOSIS — M1711 Unilateral primary osteoarthritis, right knee: Secondary | ICD-10-CM | POA: Diagnosis not present

## 2020-02-27 DIAGNOSIS — M1711 Unilateral primary osteoarthritis, right knee: Secondary | ICD-10-CM | POA: Diagnosis not present

## 2020-03-05 DIAGNOSIS — M1711 Unilateral primary osteoarthritis, right knee: Secondary | ICD-10-CM | POA: Diagnosis not present

## 2020-03-11 DIAGNOSIS — H26491 Other secondary cataract, right eye: Secondary | ICD-10-CM | POA: Diagnosis not present

## 2020-06-02 DIAGNOSIS — E538 Deficiency of other specified B group vitamins: Secondary | ICD-10-CM | POA: Diagnosis not present

## 2020-06-02 DIAGNOSIS — G5711 Meralgia paresthetica, right lower limb: Secondary | ICD-10-CM | POA: Diagnosis not present

## 2020-06-02 DIAGNOSIS — R2689 Other abnormalities of gait and mobility: Secondary | ICD-10-CM | POA: Diagnosis not present

## 2020-06-02 DIAGNOSIS — G4733 Obstructive sleep apnea (adult) (pediatric): Secondary | ICD-10-CM | POA: Diagnosis not present

## 2020-06-02 DIAGNOSIS — G5622 Lesion of ulnar nerve, left upper limb: Secondary | ICD-10-CM | POA: Diagnosis not present

## 2020-06-02 DIAGNOSIS — G603 Idiopathic progressive neuropathy: Secondary | ICD-10-CM | POA: Diagnosis not present

## 2020-06-04 DIAGNOSIS — M1711 Unilateral primary osteoarthritis, right knee: Secondary | ICD-10-CM | POA: Diagnosis not present

## 2020-06-04 DIAGNOSIS — Z96612 Presence of left artificial shoulder joint: Secondary | ICD-10-CM | POA: Diagnosis not present

## 2020-06-12 DIAGNOSIS — L820 Inflamed seborrheic keratosis: Secondary | ICD-10-CM | POA: Diagnosis not present

## 2020-06-12 DIAGNOSIS — L814 Other melanin hyperpigmentation: Secondary | ICD-10-CM | POA: Diagnosis not present

## 2020-06-12 DIAGNOSIS — D0439 Carcinoma in situ of skin of other parts of face: Secondary | ICD-10-CM | POA: Diagnosis not present

## 2020-06-12 DIAGNOSIS — C4491 Basal cell carcinoma of skin, unspecified: Secondary | ICD-10-CM | POA: Diagnosis not present

## 2020-06-12 DIAGNOSIS — L570 Actinic keratosis: Secondary | ICD-10-CM | POA: Diagnosis not present

## 2020-07-23 DIAGNOSIS — M1711 Unilateral primary osteoarthritis, right knee: Secondary | ICD-10-CM | POA: Diagnosis not present

## 2020-07-23 DIAGNOSIS — M25561 Pain in right knee: Secondary | ICD-10-CM | POA: Diagnosis not present

## 2020-07-23 DIAGNOSIS — G8929 Other chronic pain: Secondary | ICD-10-CM | POA: Diagnosis not present

## 2020-07-24 DIAGNOSIS — Z6841 Body Mass Index (BMI) 40.0 and over, adult: Secondary | ICD-10-CM | POA: Diagnosis not present

## 2020-07-24 DIAGNOSIS — N1831 Chronic kidney disease, stage 3a: Secondary | ICD-10-CM | POA: Diagnosis not present

## 2020-07-24 DIAGNOSIS — E781 Pure hyperglyceridemia: Secondary | ICD-10-CM | POA: Diagnosis not present

## 2020-07-29 DIAGNOSIS — Z0000 Encounter for general adult medical examination without abnormal findings: Secondary | ICD-10-CM | POA: Diagnosis not present

## 2020-07-29 DIAGNOSIS — N1831 Chronic kidney disease, stage 3a: Secondary | ICD-10-CM | POA: Diagnosis not present

## 2020-07-29 DIAGNOSIS — M8580 Other specified disorders of bone density and structure, unspecified site: Secondary | ICD-10-CM | POA: Diagnosis not present

## 2020-07-29 DIAGNOSIS — F325 Major depressive disorder, single episode, in full remission: Secondary | ICD-10-CM | POA: Diagnosis not present

## 2020-07-29 DIAGNOSIS — Z6835 Body mass index (BMI) 35.0-35.9, adult: Secondary | ICD-10-CM | POA: Diagnosis not present

## 2020-07-29 DIAGNOSIS — E781 Pure hyperglyceridemia: Secondary | ICD-10-CM | POA: Diagnosis not present

## 2020-08-07 DIAGNOSIS — M8588 Other specified disorders of bone density and structure, other site: Secondary | ICD-10-CM | POA: Diagnosis not present

## 2020-08-12 DIAGNOSIS — H16223 Keratoconjunctivitis sicca, not specified as Sjogren's, bilateral: Secondary | ICD-10-CM | POA: Diagnosis not present

## 2020-09-10 DIAGNOSIS — M1711 Unilateral primary osteoarthritis, right knee: Secondary | ICD-10-CM | POA: Diagnosis not present

## 2020-09-17 DIAGNOSIS — M1711 Unilateral primary osteoarthritis, right knee: Secondary | ICD-10-CM | POA: Diagnosis not present

## 2020-09-24 DIAGNOSIS — M1711 Unilateral primary osteoarthritis, right knee: Secondary | ICD-10-CM | POA: Diagnosis not present

## 2020-11-11 DIAGNOSIS — K529 Noninfective gastroenteritis and colitis, unspecified: Secondary | ICD-10-CM | POA: Diagnosis not present

## 2020-11-11 DIAGNOSIS — K219 Gastro-esophageal reflux disease without esophagitis: Secondary | ICD-10-CM | POA: Diagnosis not present

## 2020-11-14 DIAGNOSIS — G4733 Obstructive sleep apnea (adult) (pediatric): Secondary | ICD-10-CM | POA: Diagnosis not present

## 2020-12-15 DIAGNOSIS — G4733 Obstructive sleep apnea (adult) (pediatric): Secondary | ICD-10-CM | POA: Diagnosis not present

## 2020-12-25 DIAGNOSIS — L738 Other specified follicular disorders: Secondary | ICD-10-CM | POA: Diagnosis not present

## 2020-12-25 DIAGNOSIS — D485 Neoplasm of uncertain behavior of skin: Secondary | ICD-10-CM | POA: Diagnosis not present

## 2020-12-25 DIAGNOSIS — L821 Other seborrheic keratosis: Secondary | ICD-10-CM | POA: Diagnosis not present

## 2020-12-25 DIAGNOSIS — L570 Actinic keratosis: Secondary | ICD-10-CM | POA: Diagnosis not present

## 2020-12-25 DIAGNOSIS — L814 Other melanin hyperpigmentation: Secondary | ICD-10-CM | POA: Diagnosis not present

## 2020-12-25 DIAGNOSIS — L578 Other skin changes due to chronic exposure to nonionizing radiation: Secondary | ICD-10-CM | POA: Diagnosis not present

## 2020-12-31 ENCOUNTER — Other Ambulatory Visit: Payer: Self-pay | Admitting: Internal Medicine

## 2020-12-31 DIAGNOSIS — Z1231 Encounter for screening mammogram for malignant neoplasm of breast: Secondary | ICD-10-CM

## 2020-12-31 DIAGNOSIS — M1711 Unilateral primary osteoarthritis, right knee: Secondary | ICD-10-CM | POA: Diagnosis not present

## 2021-01-19 ENCOUNTER — Ambulatory Visit
Admission: RE | Admit: 2021-01-19 | Discharge: 2021-01-19 | Disposition: A | Discharge status: Home / Self Care | Payer: Medicare HMO | Source: Ambulatory Visit | Attending: Internal Medicine | Admitting: Internal Medicine

## 2021-01-19 ENCOUNTER — Other Ambulatory Visit: Payer: Self-pay

## 2021-01-19 DIAGNOSIS — Z1231 Encounter for screening mammogram for malignant neoplasm of breast: Secondary | ICD-10-CM | POA: Diagnosis not present

## 2021-01-21 DIAGNOSIS — E781 Pure hyperglyceridemia: Secondary | ICD-10-CM | POA: Diagnosis not present

## 2021-01-21 DIAGNOSIS — Z0000 Encounter for general adult medical examination without abnormal findings: Secondary | ICD-10-CM | POA: Diagnosis not present

## 2021-01-21 DIAGNOSIS — N1831 Chronic kidney disease, stage 3a: Secondary | ICD-10-CM | POA: Diagnosis not present

## 2021-01-21 DIAGNOSIS — Z6841 Body Mass Index (BMI) 40.0 and over, adult: Secondary | ICD-10-CM | POA: Diagnosis not present

## 2021-01-28 DIAGNOSIS — R7303 Prediabetes: Secondary | ICD-10-CM | POA: Diagnosis not present

## 2021-01-28 DIAGNOSIS — N1831 Chronic kidney disease, stage 3a: Secondary | ICD-10-CM | POA: Diagnosis not present

## 2021-01-28 DIAGNOSIS — M8580 Other specified disorders of bone density and structure, unspecified site: Secondary | ICD-10-CM | POA: Diagnosis not present

## 2021-01-28 DIAGNOSIS — E781 Pure hyperglyceridemia: Secondary | ICD-10-CM | POA: Diagnosis not present

## 2021-01-28 DIAGNOSIS — Z6837 Body mass index (BMI) 37.0-37.9, adult: Secondary | ICD-10-CM | POA: Diagnosis not present

## 2021-01-28 DIAGNOSIS — F325 Major depressive disorder, single episode, in full remission: Secondary | ICD-10-CM | POA: Diagnosis not present

## 2021-03-11 DIAGNOSIS — J189 Pneumonia, unspecified organism: Secondary | ICD-10-CM

## 2021-03-11 HISTORY — DX: Pneumonia, unspecified organism: J18.9

## 2021-03-19 DIAGNOSIS — L570 Actinic keratosis: Secondary | ICD-10-CM | POA: Diagnosis not present

## 2021-03-19 DIAGNOSIS — L568 Other specified acute skin changes due to ultraviolet radiation: Secondary | ICD-10-CM | POA: Diagnosis not present

## 2021-04-01 DIAGNOSIS — M1711 Unilateral primary osteoarthritis, right knee: Secondary | ICD-10-CM | POA: Diagnosis not present

## 2021-04-03 ENCOUNTER — Encounter: Payer: Self-pay | Admitting: Intensive Care

## 2021-04-03 ENCOUNTER — Other Ambulatory Visit: Payer: Self-pay

## 2021-04-03 ENCOUNTER — Emergency Department: Payer: Medicare HMO

## 2021-04-03 ENCOUNTER — Inpatient Hospital Stay
Admission: EM | Admit: 2021-04-03 | Discharge: 2021-04-06 | DRG: 193 | Disposition: A | Discharge status: Home / Self Care | Payer: Medicare HMO | Attending: Internal Medicine | Admitting: Internal Medicine

## 2021-04-03 DIAGNOSIS — I4892 Unspecified atrial flutter: Secondary | ICD-10-CM | POA: Diagnosis not present

## 2021-04-03 DIAGNOSIS — G629 Polyneuropathy, unspecified: Secondary | ICD-10-CM | POA: Diagnosis present

## 2021-04-03 DIAGNOSIS — Z87891 Personal history of nicotine dependence: Secondary | ICD-10-CM

## 2021-04-03 DIAGNOSIS — J9601 Acute respiratory failure with hypoxia: Secondary | ICD-10-CM | POA: Diagnosis not present

## 2021-04-03 DIAGNOSIS — Z881 Allergy status to other antibiotic agents status: Secondary | ICD-10-CM

## 2021-04-03 DIAGNOSIS — N189 Chronic kidney disease, unspecified: Secondary | ICD-10-CM | POA: Diagnosis present

## 2021-04-03 DIAGNOSIS — E7800 Pure hypercholesterolemia, unspecified: Secondary | ICD-10-CM | POA: Diagnosis present

## 2021-04-03 DIAGNOSIS — Z20822 Contact with and (suspected) exposure to covid-19: Secondary | ICD-10-CM | POA: Diagnosis not present

## 2021-04-03 DIAGNOSIS — B961 Klebsiella pneumoniae [K. pneumoniae] as the cause of diseases classified elsewhere: Secondary | ICD-10-CM | POA: Diagnosis present

## 2021-04-03 DIAGNOSIS — I5032 Chronic diastolic (congestive) heart failure: Secondary | ICD-10-CM | POA: Diagnosis not present

## 2021-04-03 DIAGNOSIS — Z79899 Other long term (current) drug therapy: Secondary | ICD-10-CM

## 2021-04-03 DIAGNOSIS — R0902 Hypoxemia: Secondary | ICD-10-CM | POA: Diagnosis not present

## 2021-04-03 DIAGNOSIS — R778 Other specified abnormalities of plasma proteins: Secondary | ICD-10-CM

## 2021-04-03 DIAGNOSIS — I480 Paroxysmal atrial fibrillation: Secondary | ICD-10-CM | POA: Diagnosis present

## 2021-04-03 DIAGNOSIS — Z91018 Allergy to other foods: Secondary | ICD-10-CM

## 2021-04-03 DIAGNOSIS — E876 Hypokalemia: Secondary | ICD-10-CM | POA: Diagnosis present

## 2021-04-03 DIAGNOSIS — I4719 Other supraventricular tachycardia: Secondary | ICD-10-CM | POA: Diagnosis present

## 2021-04-03 DIAGNOSIS — R109 Unspecified abdominal pain: Secondary | ICD-10-CM | POA: Diagnosis not present

## 2021-04-03 DIAGNOSIS — Z880 Allergy status to penicillin: Secondary | ICD-10-CM

## 2021-04-03 DIAGNOSIS — G4733 Obstructive sleep apnea (adult) (pediatric): Secondary | ICD-10-CM | POA: Diagnosis present

## 2021-04-03 DIAGNOSIS — J9811 Atelectasis: Secondary | ICD-10-CM | POA: Diagnosis not present

## 2021-04-03 DIAGNOSIS — N3000 Acute cystitis without hematuria: Secondary | ICD-10-CM | POA: Diagnosis present

## 2021-04-03 DIAGNOSIS — J189 Pneumonia, unspecified organism: Secondary | ICD-10-CM | POA: Diagnosis not present

## 2021-04-03 DIAGNOSIS — R197 Diarrhea, unspecified: Secondary | ICD-10-CM | POA: Diagnosis present

## 2021-04-03 DIAGNOSIS — R000 Tachycardia, unspecified: Secondary | ICD-10-CM | POA: Diagnosis not present

## 2021-04-03 DIAGNOSIS — F32A Depression, unspecified: Secondary | ICD-10-CM | POA: Diagnosis not present

## 2021-04-03 DIAGNOSIS — K219 Gastro-esophageal reflux disease without esophagitis: Secondary | ICD-10-CM

## 2021-04-03 DIAGNOSIS — I471 Supraventricular tachycardia: Secondary | ICD-10-CM | POA: Diagnosis not present

## 2021-04-03 DIAGNOSIS — F419 Anxiety disorder, unspecified: Secondary | ICD-10-CM | POA: Diagnosis present

## 2021-04-03 DIAGNOSIS — G250 Essential tremor: Secondary | ICD-10-CM | POA: Diagnosis not present

## 2021-04-03 DIAGNOSIS — Z972 Presence of dental prosthetic device (complete) (partial): Secondary | ICD-10-CM

## 2021-04-03 DIAGNOSIS — M1990 Unspecified osteoarthritis, unspecified site: Secondary | ICD-10-CM | POA: Diagnosis present

## 2021-04-03 DIAGNOSIS — Z85820 Personal history of malignant melanoma of skin: Secondary | ICD-10-CM

## 2021-04-03 DIAGNOSIS — M810 Age-related osteoporosis without current pathological fracture: Secondary | ICD-10-CM | POA: Diagnosis present

## 2021-04-03 DIAGNOSIS — N3001 Acute cystitis with hematuria: Secondary | ICD-10-CM | POA: Diagnosis not present

## 2021-04-03 DIAGNOSIS — E860 Dehydration: Secondary | ICD-10-CM | POA: Diagnosis present

## 2021-04-03 DIAGNOSIS — Z91048 Other nonmedicinal substance allergy status: Secondary | ICD-10-CM

## 2021-04-03 DIAGNOSIS — Z90710 Acquired absence of both cervix and uterus: Secondary | ICD-10-CM

## 2021-04-03 DIAGNOSIS — R531 Weakness: Secondary | ICD-10-CM | POA: Diagnosis not present

## 2021-04-03 DIAGNOSIS — Z888 Allergy status to other drugs, medicaments and biological substances status: Secondary | ICD-10-CM

## 2021-04-03 DIAGNOSIS — I248 Other forms of acute ischemic heart disease: Secondary | ICD-10-CM | POA: Diagnosis present

## 2021-04-03 LAB — COMPREHENSIVE METABOLIC PANEL
ALT: 38 U/L (ref 0–44)
AST: 26 U/L (ref 15–41)
Albumin: 3.5 g/dL (ref 3.5–5.0)
Alkaline Phosphatase: 37 U/L — ABNORMAL LOW (ref 38–126)
Anion gap: 12 (ref 5–15)
BUN: 13 mg/dL (ref 8–23)
CO2: 30 mmol/L (ref 22–32)
Calcium: 8.9 mg/dL (ref 8.9–10.3)
Chloride: 94 mmol/L — ABNORMAL LOW (ref 98–111)
Creatinine, Ser: 0.77 mg/dL (ref 0.44–1.00)
GFR, Estimated: >60 mL/min (ref >60)
Glucose, Bld: 106 mg/dL — ABNORMAL HIGH (ref 70–99)
Potassium: 3 mmol/L — ABNORMAL LOW (ref 3.5–5.1)
Sodium: 136 mmol/L (ref 135–145)
Total Bilirubin: 0.9 mg/dL (ref 0.3–1.2)
Total Protein: 7.9 g/dL (ref 6.5–8.1)

## 2021-04-03 LAB — URINALYSIS, ROUTINE W REFLEX MICROSCOPIC
Bilirubin Urine: NEGATIVE
Glucose, UA: NEGATIVE mg/dL
Ketones, ur: 5 mg/dL — AB
Nitrite: NEGATIVE
Protein, ur: 30 mg/dL — AB
Specific Gravity, Urine: 1.019 (ref 1.005–1.030)
WBC, UA: >50 WBC/hpf — ABNORMAL HIGH (ref 0–5)
pH: 5 (ref 5.0–8.0)

## 2021-04-03 LAB — URINE DRUG SCREEN, QUALITATIVE (ARMC ONLY)
Amphetamines, Ur Screen: NOT DETECTED
Barbiturates, Ur Screen: NOT DETECTED
Benzodiazepine, Ur Scrn: NOT DETECTED
Cannabinoid 50 Ng, Ur ~~LOC~~: NOT DETECTED
Cocaine Metabolite,Ur ~~LOC~~: NOT DETECTED
MDMA (Ecstasy)Ur Screen: POSITIVE — AB
Methadone Scn, Ur: NOT DETECTED
Opiate, Ur Screen: NOT DETECTED
Phencyclidine (PCP) Ur S: NOT DETECTED
Tricyclic, Ur Screen: NOT DETECTED

## 2021-04-03 LAB — CBC
HCT: 48.6 % — ABNORMAL HIGH (ref 36.0–46.0)
Hemoglobin: 15.9 g/dL — ABNORMAL HIGH (ref 12.0–15.0)
MCH: 28.3 pg (ref 26.0–34.0)
MCHC: 32.7 g/dL (ref 30.0–36.0)
MCV: 86.5 fL (ref 80.0–100.0)
Platelets: 308 10*3/uL (ref 150–400)
RBC: 5.62 MIL/uL — ABNORMAL HIGH (ref 3.87–5.11)
RDW: 13.2 % (ref 11.5–15.5)
WBC: 8.1 10*3/uL (ref 4.0–10.5)
nRBC: 0 % (ref 0.0–0.2)

## 2021-04-03 LAB — LIPASE, BLOOD: Lipase: 39 U/L (ref 11–51)

## 2021-04-03 LAB — LACTIC ACID, PLASMA: Lactic Acid, Venous: 1 mmol/L (ref 0.5–1.9)

## 2021-04-03 LAB — RESP PANEL BY RT-PCR (FLU A&B, COVID) ARPGX2
Influenza A by PCR: NEGATIVE
Influenza B by PCR: NEGATIVE
SARS Coronavirus 2 by RT PCR: NEGATIVE

## 2021-04-03 LAB — TROPONIN I (HIGH SENSITIVITY): Troponin I (High Sensitivity): 60 ng/L — ABNORMAL HIGH (ref <18)

## 2021-04-03 LAB — TSH: TSH: 3.775 u[IU]/mL (ref 0.350–4.500)

## 2021-04-03 LAB — PROCALCITONIN: Procalcitonin: <0.1 ng/mL

## 2021-04-03 LAB — MAGNESIUM: Magnesium: 1.8 mg/dL (ref 1.7–2.4)

## 2021-04-03 LAB — BRAIN NATRIURETIC PEPTIDE: B Natriuretic Peptide: 391.7 pg/mL — ABNORMAL HIGH (ref 0.0–100.0)

## 2021-04-03 MED ORDER — SODIUM CHLORIDE 0.9 % IV SOLN
2.0000 g | INTRAVENOUS | Status: DC
  Administered 2021-04-04 – 2021-04-05 (×2): 2 g via INTRAVENOUS
  Filled 2021-04-03 (×3): qty 20

## 2021-04-03 MED ORDER — IOHEXOL 300 MG/ML  SOLN
100.0000 mL | Freq: Once | INTRAMUSCULAR | Status: AC | PRN
  Administered 2021-04-03: 100 mL via INTRAVENOUS
  Filled 2021-04-03: qty 100

## 2021-04-03 MED ORDER — ACETAMINOPHEN 650 MG RE SUPP: 650.0000 mg | Freq: Four times a day (QID) | RECTAL | Status: DC | PRN

## 2021-04-03 MED ORDER — FENOFIBRATE 160 MG PO TABS
160.0000 mg | ORAL_TABLET | Freq: Every day | ORAL | Status: DC
  Administered 2021-04-04 – 2021-04-05 (×2): 160 mg via ORAL
  Filled 2021-04-03 (×3): qty 1

## 2021-04-03 MED ORDER — AZITHROMYCIN 500 MG IV SOLR
500.0000 mg | INTRAVENOUS | Status: DC
  Administered 2021-04-04 – 2021-04-05 (×2): 500 mg via INTRAVENOUS
  Filled 2021-04-03: qty 5
  Filled 2021-04-03: qty 500
  Filled 2021-04-03 (×2): qty 5

## 2021-04-03 MED ORDER — LACTATED RINGERS IV BOLUS
1000.0000 mL | Freq: Once | INTRAVENOUS | Status: AC
  Administered 2021-04-03: 1000 mL via INTRAVENOUS

## 2021-04-03 MED ORDER — ONDANSETRON HCL 4 MG/2ML IJ SOLN
4.0000 mg | Freq: Once | INTRAMUSCULAR | Status: AC
  Administered 2021-04-03: 4 mg via INTRAVENOUS
  Filled 2021-04-03: qty 2

## 2021-04-03 MED ORDER — PROPRANOLOL HCL 20 MG PO TABS
20.0000 mg | ORAL_TABLET | ORAL | Status: AC
  Administered 2021-04-03: 20 mg via ORAL
  Filled 2021-04-03: qty 1

## 2021-04-03 MED ORDER — POTASSIUM CHLORIDE CRYS ER 20 MEQ PO TBCR
40.0000 meq | EXTENDED_RELEASE_TABLET | Freq: Once | ORAL | Status: AC
  Administered 2021-04-03: 40 meq via ORAL

## 2021-04-03 MED ORDER — MAGNESIUM SULFATE 2 GM/50ML IV SOLN
2.0000 g | Freq: Once | INTRAVENOUS | Status: AC
Start: 2021-04-03 — End: 2021-04-03
  Administered 2021-04-03: 2 g via INTRAVENOUS
  Filled 2021-04-03: qty 50

## 2021-04-03 MED ORDER — SODIUM CHLORIDE 0.9 % IV SOLN
1.0000 g | Freq: Once | INTRAVENOUS | Status: AC
  Administered 2021-04-03: 1 g via INTRAVENOUS
  Filled 2021-04-03: qty 10

## 2021-04-03 MED ORDER — POTASSIUM CHLORIDE CRYS ER 20 MEQ PO TBCR
40.0000 meq | EXTENDED_RELEASE_TABLET | Freq: Once | ORAL | Status: AC
  Administered 2021-04-03: 40 meq via ORAL
  Filled 2021-04-03: qty 2

## 2021-04-03 MED ORDER — IOHEXOL 350 MG/ML SOLN
60.0000 mL | Freq: Once | INTRAVENOUS | Status: AC | PRN
  Administered 2021-04-03: 60 mL via INTRAVENOUS
  Filled 2021-04-03: qty 60

## 2021-04-03 MED ORDER — PROPRANOLOL HCL 20 MG PO TABS
20.0000 mg | ORAL_TABLET | Freq: Two times a day (BID) | ORAL | Status: DC
  Administered 2021-04-04 – 2021-04-05 (×4): 20 mg via ORAL
  Filled 2021-04-03 (×4): qty 1

## 2021-04-03 MED ORDER — METOPROLOL TARTRATE 5 MG/5ML IV SOLN: 5.0000 mg | INTRAVENOUS | Status: DC | PRN

## 2021-04-03 MED ORDER — LACTATED RINGERS IV BOLUS: 1000.0000 mL | Freq: Once | INTRAVENOUS | Status: DC

## 2021-04-03 MED ORDER — ACETAMINOPHEN 325 MG PO TABS: 650.0000 mg | ORAL_TABLET | Freq: Four times a day (QID) | ORAL | Status: DC | PRN

## 2021-04-03 MED ORDER — LACTATED RINGERS IV SOLN: INTRAVENOUS | Status: AC

## 2021-04-03 MED ORDER — LACTATED RINGERS IV BOLUS
500.0000 mL | Freq: Once | INTRAVENOUS | Status: AC
  Administered 2021-04-03: 500 mL via INTRAVENOUS

## 2021-04-03 MED ORDER — SODIUM CHLORIDE 0.9 % IV SOLN
500.0000 mg | Freq: Once | INTRAVENOUS | Status: AC
  Administered 2021-04-03: 500 mg via INTRAVENOUS
  Filled 2021-04-03: qty 5

## 2021-04-03 MED ORDER — RALOXIFENE HCL 60 MG PO TABS
60.0000 mg | ORAL_TABLET | Freq: Every day | ORAL | Status: DC
  Administered 2021-04-04 – 2021-04-05 (×2): 60 mg via ORAL
  Filled 2021-04-03 (×3): qty 1

## 2021-04-03 NOTE — Sepsis Progress Note (Addendum)
Monitoring for the code sepsis protocol.  Antibiotic given almost 4 hrs prior to ordering code sepsis & collection of blood cultures.

## 2021-04-03 NOTE — ED Notes (Signed)
2L oxygen turned off by provider to obtain RA sats. Pt satting 83% on RA. Turned back to 2L on [place].

## 2021-04-03 NOTE — ED Notes (Signed)
Pt to xray and CT now.

## 2021-04-03 NOTE — ED Notes (Addendum)
Provider at bedside, see new orders. See triage note re: diarrheal episodes.   Has had several episodes of diarrhea per day since 5 days ago. Denies blood in stools. States feels weak and dehydrated.   Denies abdominal pain.

## 2021-04-03 NOTE — ED Triage Notes (Signed)
Patient c/o diarrhea X1 week. Denies pain

## 2021-04-03 NOTE — ED Notes (Signed)
Informed provider of RA sats and that turned oxygen back to 2L. Also informed of persistent tachycardia. See new orders. Pt to CT now for CTA, then Rns to give propanolol and obtain repeat EKG after.

## 2021-04-03 NOTE — ED Provider Notes (Addendum)
[facility] Provider Note    Event Date/Time   First MD Initiated Contact with Patient 04/03/21 1510     (approximate)   History   Diarrhea   HPI  Annette Quinn is a 71 y.o. female with a past medical history of arthritis, anxiety, depression, diverticulitis, GERD, HDL, OSA on CPAP, and CKD who presents for evaluation approximate 1 week of diarrhea associated with crampiness in her back and decreased appetite.  She denies any fevers, chest pain, cough, shortness of breath, headache, sore throat or urinary symptoms.  She does states she had tight muscle in her upper back but this got better with the muscle relaxants in the last day or so.  No other acute concerns at this time.      Physical Exam  Triage Vital Signs: ED Triage Vitals  Enc Vitals Group     BP 04/03/21 1437 112/83     Pulse Rate 04/03/21 1437 (!) 130     Resp 04/03/21 1437 20     Temp 04/03/21 1437 97.6 F (36.4 C)     Temp Source 04/03/21 1437 Oral     SpO2 04/03/21 1437 93 %     Weight 04/03/21 1432 208 lb (94.3 kg)     Height 04/03/21 1432 5\' 6"  (1.676 m)     Head Circumference --      Peak Flow --      Pain Score 04/03/21 1431 0     Pain Loc --      Pain Edu? --      Excl. in [place]? --     Most recent vital signs: Vitals:   04/03/21 1900 04/03/21 2000  BP: 103/78 103/73  Pulse: (!) 133 (!) 132  Resp: 15 (!) 23  Temp: 97.8 F (36.6 C)   SpO2: 95% 95%    General: Awake, no distress.  CV:  Good peripheral perfusion.  Slight tachycardic.  2+ radial pulses. Resp:  Normal effort.  Abd:  No distention.  Soft throughout. Other:  Mucous membranes.  No significant CVA tenderness.  Mild lower extremity edema.   ED Results / Procedures / Treatments  Labs (all labs ordered are listed, but only abnormal results are displayed) Labs Reviewed  COMPREHENSIVE METABOLIC PANEL - Abnormal; Notable for the following components:      Result Value   Potassium 3.0 (*)    Chloride 94  (*)    Glucose, Bld 106 (*)    Alkaline Phosphatase 37 (*)    All other components within normal limits  CBC - Abnormal; Notable for the following components:   RBC 5.62 (*)    Hemoglobin 15.9 (*)    HCT 48.6 (*)    All other components within normal limits  URINALYSIS, ROUTINE W REFLEX MICROSCOPIC - Abnormal; Notable for the following components:   Color, Urine AMBER (*)    APPearance CLOUDY (*)    Hgb urine dipstick SMALL (*)    Ketones, ur 5 (*)    Protein, ur 30 (*)    Leukocytes,Ua LARGE (*)    WBC, UA >50 (*)    Bacteria, UA MANY (*)    All other components within normal limits  BRAIN NATRIURETIC PEPTIDE - Abnormal; Notable for the following components:   B Natriuretic Peptide 391.7 (*)    All other components within normal limits  TROPONIN I (HIGH SENSITIVITY) - Abnormal; Notable for the following components:   Troponin I (High Sensitivity) 60 (*)    All  other components within normal limits  RESP PANEL BY RT-PCR (FLU A&B, COVID) ARPGX2  C DIFFICILE QUICK SCREEN W PCR REFLEX    GASTROINTESTINAL PANEL BY PCR, STOOL (REPLACES STOOL CULTURE)  URINE CULTURE  CULTURE, BLOOD (ROUTINE X 2)  CULTURE, BLOOD (ROUTINE X 2)  LIPASE, BLOOD  MAGNESIUM  LACTIC ACID, PLASMA  PROCALCITONIN  LACTIC ACID, PLASMA     EKG  EKG is remarkable a flutter with variable AV block with left anterior fascicle block with otherwise unremarkable intervals and some nonspecific changes versus artifact throughout.  RADIOLOGY Chest x-ray obtained shows some bronchitic changes in all bases without other clear infiltrate, effusion, edema, pneumothorax or other clear acute thoracic process.  CT abdomen pelvis on interpretation without evidence of diverticulitis, abscess, perforation, kidney stone, or other clear acute abdominal process.  Labs reviewed radiology's findings and agree with the interpretation of some cholelithiasis without evidence of cholecystitis diverticulosis without evidence of  diverticulitis or any other clear acute abdominal or pelvic process.  No evidence of pneumonia in the lower lung fields, pancreatitis, or other acute thoracic process.  CTA chest my interpretation no evidence of large PE there is some opacification of the right upper lobe.  No other acute process per interpretation.  Also reviewed radiology interpretation negative findings and evidence of PE but possible pneumonia versus neoplastic process in the upper lobe.  There is also a 4 mm solid noncalcified right upper lobe nodule no evidence of edema or other clear acute process.  There is significant calcification of the aortic arch.   PROCEDURES:  Critical Care performed: Yes, see critical care procedure note(s)  .Critical Care Performed by: Lucrezia Starch, MD Authorized by: Lucrezia Starch, MD   Critical care provider statement:    Critical care time (minutes):  30   Critical care was necessary to treat or prevent imminent or life-threatening deterioration of the following conditions:  Respiratory failure and sepsis   Critical care was time spent personally by me on the following activities:  Development of treatment plan with patient or surrogate, discussions with consultants, evaluation of patient's response to treatment, examination of patient, ordering and review of laboratory studies, ordering and review of radiographic studies, ordering and performing treatments and interventions, pulse oximetry, re-evaluation of patient's condition and review of old charts    MEDICATIONS ORDERED IN ED: Medications  azithromycin (ZITHROMAX) 500 mg in sodium chloride 0.9 % 250 mL IVPB (has no administration in time range)  lactated ringers bolus 1,000 mL (0 mLs Intravenous Stopped 04/03/21 1815)  ondansetron (ZOFRAN) injection 4 mg (4 mg Intravenous Given 04/03/21 1535)  potassium chloride SA (KLOR-CON M) CR tablet 40 mEq (40 mEq Oral Given 04/03/21 1545)  cefTRIAXone (ROCEPHIN) 1 g in sodium chloride 0.9 %  100 mL IVPB (0 g Intravenous Stopped 04/03/21 1800)  iohexol (OMNIPAQUE) 300 MG/ML solution 100 mL (100 mLs Intravenous Contrast Given 04/03/21 1615)  iohexol (OMNIPAQUE) 350 MG/ML injection 60 mL (60 mLs Intravenous Contrast Given 04/03/21 1829)  propranolol (INDERAL) tablet 20 mg (20 mg Oral Given 04/03/21 1901)  lactated ringers bolus 500 mL (500 mLs Intravenous New Bag/Given 04/03/21 2013)     IMPRESSION / MDM / [place] / ED COURSE  I reviewed the triage vital signs and the nursing notes.                              Differential diagnosis includes, but is not limited to infectious  enteritis, diverticulitis, cystitis, metabolic derangements associated with this kidney injury.  Patient does appear dehydrated on arrival and is tachycardic with otherwise stable vitals.   While undergoing initial work-up patient was noted to have SPO2 decreased from 93-91 and for a few seconds 88.  She states she is not sure what it normally is when asked to take deep breath comes back up to the low 90s.  She thinks it may be related to her sleep apnea.  She denies any shortness of breath cough or chest pain.  Will obtain EKG and chest x-ray as well to assess for possible pneumonia pulm edema or arrhythmia.  CBC without leukocytosis or acute anemia.  CMP is remarkable for K of 3 without any other significant electrolyte or metabolic derangements.  No evidence of hepatitis or acute cholestatic process.  Lipase not consistent with acute pancreatitis.  UA is concerning for cystitis with large leukocyte esterase and greater than 50 WBCs with many bacteria noted.  Will send urine culture and give her Rocephin pending remainder of work-up including a CT of the abdomen pelvis.  Magnesium is within normal limits. Lactic acid is not elevated.  COVID influenza PCR is negative.  BNP is very slightly elevated at 391.  Chest x-ray obtained shows some bronchitic changes in all bases without other clear infiltrate,  effusion, edema, pneumothorax or other clear acute thoracic process.  CTA chest my interpretation no evidence of large PE there is some opacification of the right upper lobe.  No other acute process per interpretation.  Also reviewed radiology interpretation negative findings and evidence of PE but possible pneumonia versus neoplastic process in the upper lobe.  There is also a 4 mm solid noncalcified right upper lobe nodule no evidence of edema or other clear acute process.  There is significant calcification of the aortic arch.  CT abdomen pelvis on interpretation without evidence of diverticulitis, abscess, perforation, kidney stone, or other clear acute abdominal process.  Labs reviewed radiology's findings and agree with the interpretation of some cholelithiasis without evidence of cholecystitis diverticulosis without evidence of diverticulitis or any other clear acute abdominal or pelvic process.  No evidence of pneumonia in the lower lung fields, pancreatitis, or other acute thoracic process.  Of note ECG shows what appears to be a flutter.  Patient has no history of this and was on propranolol for tremors.  Will defer IV diltiazem or metoprolol pending response to her usual propranolol and some gentle hydration.  It is possible she will need to be started anticoagulation and additional rate control medications.  On arrival patient appeared dehydrated given reports of diarrhea without any report of shortness of breath or cough lower suspicion for pneumonia it was more concern for some dehydration from enteritis.  She does have evidence of cystitis on her UA.  However while she had borderline SPO2 on arrival over 5 hours of work-up in the emergency room her SPO2 did tend to downtrend and she was noted to have a good waveform at mid 80s on several rechecks.  This improved to mid 90s on 2 L nasal cannula.  I obtained a CTA chest to rule out a PE this was notable possible right upper lobe pneumonia which  could also explain her hypoxia.  Given new hypoxia and persistent tachycardia after she was given her home Inderal and some IV fluids and concern for possible early sepsis.  Will obtain blood cultures and hydrate gently and admit to medicine service for further evaluation and management.  Advised patient of incidental nodule on CTA chest and that this will need to be followed up outpatient.       FINAL CLINICAL IMPRESSION(S) / ED DIAGNOSES   Final diagnoses:  Acute cystitis without hematuria  Dehydration  Hypokalemia  Diarrhea, unspecified type  Community acquired pneumonia of right lung, unspecified part of lung  Acute respiratory failure with hypoxia (HCC)  Atrial flutter, unspecified type [facility])     Rx / DC Orders   ED Discharge Orders          Ordered    Electrocardiogram report  Status:  Canceled        04/03/21 1539             Note:  This document was prepared using Dragon voice recognition software and may include unintentional dictation errors.   Lucrezia Starch, MD 04/03/21 1940    Lucrezia Starch, MD 04/03/21 2014

## 2021-04-03 NOTE — ED Notes (Signed)
Provided water to pt.

## 2021-04-03 NOTE — H&P (Signed)
History and Physical    PLEASE NOTE THAT [employer]AGON DICT[employer] SOFTWARE WAS USED IN THE CONSTRUCTION OF THIS NOTE.   Annette Quinn FOY:[id number] DOB: 1950/09/07 DOA: 04/03/2021  PCP: Kirk Ruths, MD  Patient coming from: home   I have personally briefly reviewed patient's old medical records in [place]  Chief Complaint: diarrhea  HPI: Annette Quinn is a 71 y.o. female with medical history significant for GERD, benign essential tremor, hyperlipidemia, who is admitted to [facility] on 04/03/2021 with community-acquired pneumonia after presenting from home to [facility] ED complaining of diarrhea.   The patient reports new onset loose stool starting 1 week ago, noting 3-4 daily episodes of loose watery stool over that timeframe, with most recent episode occurring on the morning of 04/03/2021, in the absence of any melena or hematochezia.  She notes associated intermittent generalized crampy abdominal discomfort, without radiation.  No recent trauma or travel.  Not associated with rash.  Will she denies any recent antibiotic use, she does report use of Protonix on a daily basis.  Denies any associated subjective fever, chills, rigors, generalized myalgias.  She does however note intermittent nausea in the absence of any vomiting, resulting in significant decline in oral intake over the last week.  Consistent with this, she notes that she has not been taking the majority of her medications as an outpatient over the last week, noting no propanolol use for almost the totality of that timeframe.  Denies overt dysuria or gross hematuria.  Denies any recent chest pain, shortness of breath, palpitations, diaphoresis, dizziness, presyncope, or syncope.  No recent cough or wheezing.  She reports that she is a former smoker, and completely quit smoking approximately 10 years ago after smoking for greater than 40 years.  Denies any known underlying chronic pulmonary pathology, including no formal  diagnosis of COPD.  Denies any known baseline supplemental oxygen demands.  Per chart review, no documented history of atrial fibrillation or atrial flutter.  Not on any blood thinners as an outpatient.     ED Course:  Vital signs in the ED were notable for the following: Afebrile; initial heart rate in in the low 140s, subsequent improving to 125 following interval initiation of IV fluids, initiation with IV antibiotics, as well as resumption of home propanolol; blood pressure 95/64 - 118/91; respiratory rate 16-27, most recently 18; initial oxygen saturation 85% on room air, with subsequent improvement to 96-97% on 2 L nasal cannula.  Labs were notable for the following: CMP notable for the following: Sodium 136, potassium 3.0, bicarbonate 30, creatinine 0.77, glucose 106, liver enzymes within normal limits.  Lipase 39.  Serum magnesium level 1.8.  BNP 391, without any prior BNP data point available for point comparison.  High-sensitivity troponin I noted to be 60, without any prior troponin data point available for point comparison.  CBC notable for white cell count 8100.  Lactic acid 1.0.  Urinalysis associated with a cloudy appearing specimen notable for greater than 50 white blood cells, many bacteria, large leukocyte esterase, specific gravity 1.019, and the presence of hyaline cast.  C. difficile as well as GI panel by PCR been ordered, with results currently pending.  COVID-19/influenza PCR results were negative.  Blood cultures x2 as well as urine culture collected prior to initiation of IV antibiotics.  Imaging and additional notable ED work-up: EKG rhythm unclear, as it was read as atrial flutter, however rhythm appears to be irregular, with narrow complex tachycardia, ventricular rate 125, with  occasional P waves.   Chest x-ray shows evidence of chronic bronchitis changes without infiltrate, edema, effusion, or pneumothorax.  CTA of the chest, which was performed in the context of  presenting tachycardia and acute hypoxic respiratory distress, showed no evidence of acute pulmonary embolism, while showing patchy airspace opacity in the right upper lobe consistent with pneumonia, in the absence of any evidence of edema, effusion, or pneumothorax.  CT abdomen/pelvis showed cholelithiasis without evidence of acute cholecystitis nor any evidence of choledocholithiasis, while showing diverticulosis without evidence of diverticulitis, and otherwise no evidence of acute intra-abdominal or acute intrapelvic process.  While in the ED, the following were administered: Zofran 4 mg IV x1, potassium chloride 40 mill colons p.o. x1 propanolol 20 mg p.o. x1, azithromycin, Rocephin, lactated Ringer's x1.5 L bolus.  Subsequently, the patient was admitted for further evaluation and management of suspected community-acquired pneumonia complicated by acute hypoxic respiratory distress with potential concomitant urinary tract infection, with presentation also notable for mildly elevated initial troponin and hypokalemia.     Review of Systems: As per HPI otherwise 10 point review of systems negative.   Past Medical History:  Diagnosis Date   Adopted    Arthritis    osteoarthritis   Blood clot in vein 1975   Cancer ([place])    Complication of anesthesia    body aches after anesthesia   Depression    Diverticulosis    GERD (gastroesophageal reflux disease)    Hepatitis    as a child   History of bronchitis    Hypercholesterolemia    Motion sickness    [place]   Neuropathy    upper legs   Osteoporosis    Skin cancer (melanoma) (HCC)    left upper thigh   Skin cancer of face    basal cell   Sleep apnea    CPAP   Tremor    Wears dentures    full upper and lower    Past Surgical History:  Procedure Laterality Date   ABDOMINAL HYSTERECTOMY  1979   BLADDER SUSPENSION  1992   BREAST BIOPSY Left 2010   benign   CATARACT EXTRACTION W/PHACO Right 12/08/2016   Procedure:  CATARACT EXTRACTION PHACO AND INTRAOCULAR LENS PLACEMENT (IOC)-RIGHT;  Surgeon: Birder Robson, MD;  Location: [facility];  Service: Ophthalmology;  Laterality: Right;  [place] 00:56.6 AP% 11.5 CDE 6.52 Fluid Pack lot # [id number]   CATARACT EXTRACTION W/PHACO Left 01/04/2017   Procedure: CATARACT EXTRACTION PHACO AND INTRAOCULAR LENS PLACEMENT (IOC);  Surgeon: Birder Robson, MD;  Location: [facility];  Service: Ophthalmology;  Laterality: Left;  [place] 00:33.9 AP% 14.5 CDE 4.91 Fluid Pack lot # [id number]   COLONOSCOPY  2010, 2015   COLONOSCOPY WITH PROPOFOL N/A 05/02/2019   Procedure: COLONOSCOPY WITH PROPOFOL;  Surgeon: Toledo, Benay Pike, MD;  Location: [facility];  Service: Gastroenterology;  Laterality: N/A;   DILATION AND CURETTAGE OF UTERUS  1979   DISTAL INTERPHALANGEAL JOINT FUSION Left 08/26/2016   Procedure: DISTAL INTERPHALANGEAL JOINT FUSION-LEFT INDEX FINGER;  Surgeon: Hessie Knows, MD;  Location: [facility];  Service: Orthopedics;  Laterality: Left;   HAMMER TOE SURGERY Left 1989   HAMMER TOE SURGERY Right 03/31/2016   Procedure: HAMMER TOE CORRECTION RIGHT T6 T7;  Surgeon: Samara Deist, DPM;  Location: [place];  Service: Podiatry;  Laterality: Right;  sleep apnea   KNEE ARTHROSCOPY W/ PARTIAL MEDIAL MENISCECTOMY Right 03/05/2014   medial and lateral.  Abrasion hondroplasty of medial femoral condyle and femorla trochlea.  NASAL SEPTUM SURGERY     REVERSE SHOULDER ARTHROPLASTY Left 01/20/2017   Procedure: REVERSE SHOULDER ARTHROPLASTY;  Surgeon: Corky Mull, MD;  Location: [facility];  Service: Orthopedics;  Laterality: Left;   TONSILLECTOMY  1973   TRIGGER FINGER RELEASE Left    thumb   VEIN LIGATION     WEIL OSTEOTOMY Right 03/31/2016   Procedure: WEIL OSTEOTOMY X2 RIGHT FOOT;  Surgeon: Samara Deist, DPM;  Location: [place];  Service: Podiatry;  Laterality: Right;  IV WITH POPLITEAL    Social History:  reports that she quit smoking about 10 years ago.  She has never used smokeless tobacco. She reports that she does not drink alcohol and does not use drugs.   Allergies  Allergen Reactions   Actonel [Risedronate Sodium]    Boniva [Ibandronic Acid]    Fosamax [Alendronate]     Severe muscle aches   Lopid [Gemfibrozil]     headache   Penicillins Other (See Comments)    (injectable only) - knot at site Has patient had a PCN reaction causing immediate rash, facial/tongue/throat swelling, SOB or lightheadedness with hypotension:NO Has patient had a PCN reaction causing severe rash involving mucus membranes or skin necrosis: No Has patient had a PCN reaction that required hospitalization: No Has patient had a PCN reaction occurring within the last 10 years: No If all of the above answers are "NO", then may proceed with Cephalosporin use.    Reclast [Zoledronic Acid]     Severe muscle aches   Strawberry (Diagnostic) Itching   Tape     Tears skin (tegaderm and paper tape OK)   Doxycycline Rash    Family History  Adopted: Yes  Problem Relation Age of Onset   Breast cancer Neg Hx     Family history reviewed and not pertinent    Prior to Admission medications   Medication Sig Start Date End Date Taking? Authorizing Provider  acetaminophen (TYLENOL) 650 MG CR tablet Take 650-1,300 mg by mouth every 8 (eight) hours as needed for pain.    Yes [provider]  albuterol (PROVENTIL HFA;VENTOLIN HFA) 108 (90 Base) MCG/ACT inhaler Inhale 2 puffs into the lungs every 6 (six) hours as needed for wheezing or shortness of breath.    Yes [provider]  Azelastine-Fluticasone 137-50 MCG/ACT SUSP Place 1 spray into the nose 2 (two) times daily as needed (for allergies.).    Yes [provider]  bumetanide (BUMEX) 2 MG tablet Take 2 mg by mouth daily. 03/20/21  Yes [provider]  busPIRone (BUSPAR) 7.5 MG tablet Take 7.5 mg by mouth 2 (two) times daily.   Yes [provider]  Calcium  Carb-Cholecalciferol (OYSTER SHELL CALCIUM) 500-400 MG-UNIT TABS Take 1 tablet by mouth daily.   Yes [provider]  Cholecalciferol 2000 units CAPS Take 2,000 Units by mouth daily before breakfast.    Yes [provider]  fenofibrate 160 MG tablet Take 160 mg by mouth daily at 12 noon.  08/01/16  Yes [provider]  gabapentin (NEURONTIN) 300 MG capsule Take 600 mg by mouth 3 (three) times daily.  07/26/16  Yes [provider]  GLUCOSAMINE-CHONDROITIN PO Take 1 tablet by mouth daily.   Yes [provider]  montelukast (SINGULAIR) 10 MG tablet Take 10 mg by mouth every morning.    Yes [provider]  pantoprazole (PROTONIX) 40 MG tablet Take 40 mg by mouth See admin instructions. Take 1 tablet (40 mg) scheduled every morning; may  take additional dose later if needed   Yes [provider]  propranolol (INDERAL) 20 MG tablet Take 20 mg by mouth 2 (two) times daily. 02/09/21  Yes [provider]  raloxifene (EVISTA) 60 MG tablet Take 60 mg by mouth daily at 12 noon.    Yes [provider]  sertraline (ZOLOFT) 100 MG tablet Take 200 mg by mouth daily with lunch.    Yes [provider]     Objective    Physical Exam: Vitals:   04/03/21 2030 04/03/21 2100 04/03/21 2105 04/03/21 2130  BP: 96/73 95/64 90/66  (!) 91/55  Pulse: (!) 131 (!) 125 (!) 124 (!) 121  Resp: (!) 23 (!) 22 18 19   Temp:      TempSrc:      SpO2: 96% 97% 96% 97%  Weight:      Height:        General: appears to be stated age; alert, oriented Skin: warm, dry, no rash Head:  AT/[place] Mouth:  Oral mucosa membranes appear dry, normal dentition Neck: supple; trachea midline Heart: Mildly tachycardic; did not appreciate any M/R/G Lungs: CTAB, did not appreciate any wheezes, rales, or rhonchi Abdomen: + BS; soft, ND, NT Vascular: 2+ pedal pulses b/l; 2+ radial pulses b/l Extremities: no peripheral edema, no muscle wasting Neuro: strength and  sensation intact in upper and lower extremities b/l     Labs on Admission: I have personally reviewed following labs and imaging studies  CBC: Recent Labs  Lab 04/03/21 1444  WBC 8.1  HGB 15.9*  HCT 48.6*  MCV 86.5  PLT 992   Basic Metabolic Panel: Recent Labs  Lab 04/03/21 1444 04/03/21 1519  NA 136  --   K 3.0*  --   CL 94*  --   CO2 30  --   GLUCOSE 106*  --   BUN 13  --   CREATININE 0.77  --   CALCIUM 8.9  --   MG  --  1.8   GFR: Estimated Creatinine Clearance: 75.7 mL/min (by C-G formula based on SCr of 0.77 mg/dL). Liver Function Tests: Recent Labs  Lab 04/03/21 1444  AST 26  ALT 38  ALKPHOS 37*  BILITOT 0.9  PROT 7.9  ALBUMIN 3.5   Recent Labs  Lab 04/03/21 1444  LIPASE 39   No results for input(s): AMMONIA in the last 168 hours. Coagulation Profile: No results for input(s): INR, PROTIME in the last 168 hours. Cardiac Enzymes: No results for input(s): CKTOTAL, CKMB, CKMBINDEX, TROPONINI in the last 168 hours. BNP (last 3 results) No results for input(s): PROBNP in the last 8760 hours. HbA1C: No results for input(s): HGBA1C in the last 72 hours. CBG: No results for input(s): GLUCAP in the last 168 hours. Lipid Profile: No results for input(s): CHOL, HDL, LDLCALC, TRIG, CHOLHDL, LDLDIRECT in the last 72 hours. Thyroid Function Tests: No results for input(s): TSH, T4TOTAL, FREET4, T3FREE, THYROIDAB in the last 72 hours. Anemia Panel: No results for input(s): VITAMINB12, FOLATE, FERRITIN, TIBC, IRON, RETICCTPCT in the last 72 hours. Urine analysis:    Component Value Date/Time   COLORURINE AMBER (A) 04/03/2021 1444   APPEARANCEUR CLOUDY (A) 04/03/2021 1444   LABSPEC 1.019 04/03/2021 1444   PHURINE 5.0 04/03/2021 1444   GLUCOSEU NEGATIVE 04/03/2021 1444   HGBUR SMALL (A) 04/03/2021 1444   BILIRUBINUR NEGATIVE 04/03/2021 1444   KETONESUR 5 (A) 04/03/2021 1444   PROTEINUR 30 (A) 04/03/2021 1444   NITRITE NEGATIVE 04/03/2021 1444  LEUKOCYTESUR LARGE (A) 04/03/2021 1444    Radiological Exams on Admission: DG Chest 2 View  Result Date: 04/03/2021 CLINICAL DATA:  Borderline hypoxia, several episodes of diarrhea per day for 5 days, weakness, dehydration EXAM: CHEST - 2 VIEW COMPARISON:  07/05/2019 FINDINGS: Upper normal heart size. Mediastinal contours and pulmonary vascularity normal. Atherosclerotic calcification aorta. Chronic peribronchial thickening without pulmonary infiltrate, pleural effusion, or pneumothorax. Bones demineralized. LEFT shoulder prosthesis noted. IMPRESSION: Chronic bronchitic changes without infiltrate. Aortic Atherosclerosis (ICD10-I70.0). Electronically Signed   By: Lavonia Dana M.D.   On: 04/03/2021 16:21   CT Angio Chest PE W and/or Wo Contrast  Result Date: 04/03/2021 CLINICAL DATA:  Hypoxia. EXAM: CT ANGIOGRAPHY CHEST WITH CONTRAST TECHNIQUE: Multidetector CT imaging of the chest was performed using the standard protocol during bolus administration of intravenous contrast. Multiplanar CT image reconstructions and MIPs were obtained to evaluate the vascular anatomy. RADIATION DOSE REDUCTION: This exam was performed according to the departmental dose-optimization program which includes automated exposure control, adjustment of the mA and/or kV according to patient size and/or use of iterative reconstruction technique. CONTRAST:  53m OMNIPAQUE IOHEXOL 350 MG/ML SOLN COMPARISON:  None. FINDINGS: Cardiovascular: There is marked severity calcification of the aortic arch. The ascending thoracic aorta measures approximately 8.8 cm in diameter. There is no evidence of aortic dissection. Satisfactory opacification of the pulmonary arteries to the segmental level. No evidence of pulmonary embolism. Normal heart size. A trace amount of pericardial fluid is seen. Mediastinum/Nodes: There is mild right hilar lymphadenopathy. Mild AP window and paratracheal lymphadenopathy is also seen. Thyroid gland, trachea, and  esophagus demonstrate no significant findings. Lungs/Pleura: A 2.4 cm x 2.1 cm area of patchy airspace disease is seen within the posterior aspect of the right upper lobe. A 4 mm, solid noncalcified lung nodule is seen within the lateral aspect of the right upper lobe (axial CT image 26, CT series 6). A 9 mm area of focal scarring versus noncalcified lung nodule is seen within the anterolateral aspect of the left upper lobe (axial CT image 24, CT series 6). Mild areas of atelectasis are seen within the inferior aspect of the left upper lobe and posterior aspect of the bilateral lung bases. There is no evidence of a pleural effusion or pneumothorax. Upper Abdomen: No acute abnormality. Musculoskeletal: Multilevel degenerative changes seen throughout the thoracic spine. Review of the MIP images confirms the above findings. IMPRESSION: 1. No evidence of pulmonary embolism. 2. 2.4 cm x 2.1 cm area of patchy airspace disease within the posterior aspect of the right upper lobe, concerning for pneumonia. Follow-up to resolution is recommended, as an underlying neoplastic process cannot be excluded. 3. 4 mm, solid noncalcified right upper lobe lung nodule with a 9 mm area of focal scarring versus noncalcified lung nodule within the left upper lobe. Non-contrast chest CT at 3-6 months is recommended. If the nodules are stable at time of repeat CT, then future CT at 18-24 months (from today's scan) is considered optional for low-risk patients, but is recommended for high-risk patients. This recommendation follows the consensus statement: Guidelines for Management of Incidental Pulmonary Nodules Detected on CT Images: From the Fleischner Society 2017; Radiology 2017; 284:228-243. Electronically Signed   By: TVirgina NorfolkM.D.   On: 04/03/2021 18:51   CT ABDOMEN PELVIS W CONTRAST  Result Date: 04/03/2021 CLINICAL DATA:  Abdominal pain, acute, nonlocalized EXAM: CT ABDOMEN AND PELVIS WITH CONTRAST TECHNIQUE:  Multidetector CT imaging of the abdomen and pelvis was performed using the standard protocol  following bolus administration of intravenous contrast. RADIATION DOSE REDUCTION: This exam was performed according to the departmental dose-optimization program which includes automated exposure control, adjustment of the mA and/or kV according to patient size and/or use of iterative reconstruction technique. CONTRAST:  190m OMNIPAQUE IOHEXOL 300 MG/ML  SOLN COMPARISON:  None. FINDINGS: Lower chest: No acute abnormality. Hepatobiliary: No focal liver abnormality is seen. Gallstones are present. No gallbladder wall thickening or biliary dilatation. Pancreas: Unremarkable. Spleen: Unremarkable. Adrenals/Urinary Tract: Circumferential bladder wall thickening probably due to under distension. Kidneys are unremarkable. Adrenals are unremarkable. Stomach/Bowel: Stomach is within normal limits. Small duodenal diverticulum. Bowel is normal in caliber. Sigmoid diverticulosis. Vascular/Lymphatic: Atherosclerosis.  No enlarged nodes. Reproductive: Status post hysterectomy. No adnexal masses. Other: No free fluid.  Abdominal wall is unremarkable. Musculoskeletal: Degenerative changes of the included spine. IMPRESSION: No acute abnormality. Cholelithiasis. Sigmoid diverticulosis. Electronically Signed   By: PMacy MisM.D.   On: 04/03/2021 16:39     EKG: Independently reviewed, with result as described above.    Assessment/Plan    Principal Problem:   CAP (community acquired pneumonia) Active Problems:   Benign essential tremor   Acute cystitis   Narrow complex tachycardia (HCC)   Elevated troponin   Diarrhea   Hypokalemia   GERD (gastroesophageal reflux disease)      #) Community-acquired pneumonia: Suspected diagnosis in the setting of presenting acute [place] respiratory distress with CTA chest showing evidence of right upper lobe airspace opacity consistent with pneumonia in the absence of any evidence of  edema, effusion, or pneumothorax.  While the patient has recently been experiencing some nausea, she denies any associated vomiting, thereby reducing likelihood of confounding aspiration.  Given the presence of acute gastrointestinal symptoms, also check Legionella urine antigen as well as serum phosphorus level.  Of note, in the absence of objective fever or leukocytosis, SIRS criteria not met for sepsis at this time.  Lactic acid nonelevated at 1.0.  Blood cultures x2 were collected in the ED today prior to initiation of azithromycin and Rocephin   Plan: Monitor for results of blood cultures x2.  Continue azithromycin/Rocephin.  Add on procalcitonin level.  Repeat CBC with differential in the morning.  Check strep pneumoniae urine antigen, Legionella urine antigen, mycoplasma antibodies and serum phosphorus level.  Monitor continuous pulse oximetry.        #) Acute hypoxic respiratory distress: in the context of acute respiratory findings including tachypnea and no known baseline supplemental O2 requirements, presenting O2 sat noted to be in the mid 80s on room air, subsequent improving into the mid 90s on 2 L nasal cannula, thereby meeting criteria for acute hypoxic respiratory distress as opposed to acute hypoxic respiratory failure at this time. Appears to be on basis of community-acquired pneumonia, as above, in the context of CT chest demonstrating evidence of right upper lobe airspace opacity consistent with pneumonia, without any evidence of pulmonary embolism, pleural effusion, or pneumothorax.  Additionally, no clinical or radiographic evidence to suggest acute decompensated heart failure at this time.  Will the patient denies any known history of underlying COPD, in the context of her long smoking history, also check blood gas.  In terms of other considered etiologies, ACS appears less likely at this time in the absence of any recent CP, however, mildly elevated troponin of 60 noted, in  the absence of any prior troponin data points available for point comparison.  Suspect that this mild elevation is on the basis of type II supply/demand mismatch as  a consequence of potential multiple underlying presenting infections, including community-acquired pneumonia, as well as potential UTI, in addition to implications from diminished oxygen delivery capacity as a consequence of presenting acute hypoxic respiratory distress along with unspecified narrow complex tachycardia, as further detailed below.  Presentation appears less suggestive of a type I process due to acute plaque rupture .  However, will continue to trend troponin and repeat EKG, in addition to pursuing echocardiogram. COVID-19/Influenza PCR were negative when checked today.   Plan: further evaluation/management of presenting community-acquired pneumonia, as above. Monitor continuous pulse ox with prn supplemental O2 to maintain O2 sats greater than or equal to 92%. monitor on telemetry. CMP/CBC in the AM. Check serum Phos levels. Check blood gas.  Incentive spirometry.  Trend troponin.  Add on procalcitonin.       #) Acute cystitis: Potential urinary tract infection in the setting of recent abdominal discomfort, with presenting urinalysis demonstrating significant pyuria, many bacteria, large leukocyte esterase.  CT abdomen/pelvis shows no evidence of acute intra-abdominal process, including no evidence of pyelonephritis or urinary obstruction.  SIRS criteria for sepsis not met at this time, as further detailed above.  Aforementioned coverage for community-acquired pneumonia, including the Rocephin component should provide adequate empiric coverage for the patient's uncomplicated UTI.  Urine culture collected today prior to initiation of these antibiotics.  Plan: Monitor for results of blood cultures x2 as well as urine culture.  Continue Rocephin.  Repeat CBC with differential in the morning.         #) Narrow complex  tachycardia: In the absence of any known history of atrial fibrillation or atrial flutter, the patient presents with a narrow complex tachycardia with EKG reading rhythm is atrial flutter.  However, rhythm appears to be irregular with occasional P waves, with ventricular rate noted to be 125.  Not entirely clear at this time, with differential including paroxysmal atrial fibrillation versus atrial flutter versus narrow complex tachycardia with variable AV block.  Differential includes high degree AV block, this appears less likely at the present time.  Of note, tachycardia appears to be showing some improvement with IV fluids and the initiation of IV antibiotics as well as resumption of home propanolol, which the patient has not been taking over the course the last week due to intermittent nausea.  We will repeat EKG as tachycardia continues to improve as well as close monitoring on telemetry to further evaluate any underlying rhythm.  We will also pursue echocardiogram, in part due to mildly elevated initial troponin, as further detailed above.  Plan: Monitor on symmetry.  Repeat EKG, as above.  Echocardiogram in the morning.  Continuous IV fluids.  Will optimize serum magnesium level via magnesium sulfate 2 g IV every 2 hours x1 dose now.  Further evaluation management of presenting hypokalemia, as further detailed below.  Continue home propanolol.  As needed IV Lopressor for sustained heart rate greater than 130.  Further evaluation and management of suspected presenting community-acquired pneumonia and UTI, as above.  Check TSH, urinary drug screen.      #) Acute Diarrhea: 1 week of loose stool at a frequency of 3-4 such episodes of watery, nonbloody episodes per day, thereby meeting criteria for acute diarrhea.  Associated with mild generalized crampy abdominal discomfort and intermittent nausea in the absence of vomiting, with CT abdomen/pelvis showing no evidence of acute intra-abdominal/intrapelvic  process including no evidence of colitis.  Will the patient is not on any recent antibiotics, she is at increased risk for  C diff given use of PPI as outpatient due to associated increased risk for translocation of gut bacteria.  No recent reported travel.  Appears associated with presenting hypokalemia, as further detailed below.  Will provide gentle IV fluids, while further evaluating for infectious diarrhea, as further detailed below.    Plan: Follow for results of stool studies, including C. difficile PCR as well as GI panel by PCR. Will refrain from use of anti-motility agents until underlying infectious source as been ruled-out.. Gentle IVF's. Close monitoring of ensuing electrolytes and renal function with repeat CMP in the morning.  Repeat Mg in the morning. Repeat CBC in the morning. Check TSH. Monitor strict I&O's and daily weights.  Check urinary drug screen.  Hold potential contributory meds, including home Protonix.       #) Hypokalemia: Presenting serum potassium found to be 3.0, with likely contributions from 1 week of diarrhea confounded by associated decline in oral intake over that timeframe.  The magnesium level found to be 1.8, leaving some room for optimization via IV magnesium supplementation, as further detailed below.  Received 40 mEq of oral potassium chloride in the ED today.  In the context of incidentally noted narrow complex tachycardia, will further optimize serum potassium level as detailed below.  Plan: We will provide additional 40 mEq of oral potassium chloride at this time.  Magnesium sulfate 2 g IV every 2 hours x1 dose now.  Repeat CMP and serum magnesium level in the morning.  Monitor on symmetry.      #) Left upper lobe lung lesion: Today CTA chest demonstrated evidence of 9 mm area of focal scarring versus noncalcified lung nodule within the left upper lobe, with associated radiology recommendation for repeat noncontrast chest CT in 3 to 6 months.  Plan:  Recommend monitoring via noncontrast CT chest in 3 to 6 months, per radiology recommendations, as above.      #) GERD: On Protonix as an outpatient.  In the setting of presenting diarrhea, will hold home Protonix as a potential contributing factor.  Plan: Hold home PPI for now, as above.       #) Hyperlipidemia: On fenofibrate as an outpatient.  Plan: Continue home fenofibrate.       #) Benign essential tremor: Documented history of such, on propanolol as an outpatient.  Patient conveys that she has been off of her propanolol for the better part of a week in the setting of intermittent nausea, as further detailed above, with evidence of near complex tachycardia at the time of today's presentation.  Plan: Resume home propanolol.       DVT prophylaxis: SCD's   Code Status: Full code Family Communication: none Disposition Plan: Per Rounding Team Consults called: none;  Admission status: Inpatient    PLEASE NOTE THAT DRAGON DICTATION SOFTWARE WAS USED IN THE CONSTRUCTION OF THIS NOTE.   [place] DO [facility] [place]   04/03/2021, 10:11 PM

## 2021-04-04 DIAGNOSIS — J9601 Acute respiratory failure with hypoxia: Secondary | ICD-10-CM | POA: Diagnosis not present

## 2021-04-04 DIAGNOSIS — N3001 Acute cystitis with hematuria: Secondary | ICD-10-CM | POA: Diagnosis not present

## 2021-04-04 DIAGNOSIS — J189 Pneumonia, unspecified organism: Secondary | ICD-10-CM | POA: Diagnosis present

## 2021-04-04 DIAGNOSIS — E876 Hypokalemia: Secondary | ICD-10-CM | POA: Diagnosis not present

## 2021-04-04 LAB — TROPONIN I (HIGH SENSITIVITY)
Troponin I (High Sensitivity): 50 ng/L — ABNORMAL HIGH (ref <18)
Troponin I (High Sensitivity): 32 ng/L — ABNORMAL HIGH (ref <18)

## 2021-04-04 LAB — CBC WITH DIFFERENTIAL/PLATELET
Abs Immature Granulocytes: 0.03 10*3/uL (ref 0.00–0.07)
Basophils Absolute: 0.1 10*3/uL (ref 0.0–0.1)
Basophils Relative: 1 %
Eosinophils Absolute: 0.3 10*3/uL (ref 0.0–0.5)
Eosinophils Relative: 4 %
HCT: 42.3 % (ref 36.0–46.0)
Hemoglobin: 13.3 g/dL (ref 12.0–15.0)
Immature Granulocytes: 0 %
Lymphocytes Relative: 20 %
Lymphs Abs: 1.4 10*3/uL (ref 0.7–4.0)
MCH: 28.2 pg (ref 26.0–34.0)
MCHC: 31.4 g/dL (ref 30.0–36.0)
MCV: 89.6 fL (ref 80.0–100.0)
Monocytes Absolute: 0.7 10*3/uL (ref 0.1–1.0)
Monocytes Relative: 10 %
Neutro Abs: 4.6 10*3/uL (ref 1.7–7.7)
Neutrophils Relative %: 65 %
Platelets: 248 10*3/uL (ref 150–400)
RBC: 4.72 MIL/uL (ref 3.87–5.11)
RDW: 13.5 % (ref 11.5–15.5)
WBC: 7.2 10*3/uL (ref 4.0–10.5)
nRBC: 0 % (ref 0.0–0.2)

## 2021-04-04 LAB — MAGNESIUM: Magnesium: 2.3 mg/dL (ref 1.7–2.4)

## 2021-04-04 LAB — COMPREHENSIVE METABOLIC PANEL
ALT: 29 U/L (ref 0–44)
AST: 21 U/L (ref 15–41)
Albumin: 3 g/dL — ABNORMAL LOW (ref 3.5–5.0)
Alkaline Phosphatase: 33 U/L — ABNORMAL LOW (ref 38–126)
Anion gap: 10 (ref 5–15)
BUN: 9 mg/dL (ref 8–23)
CO2: 31 mmol/L (ref 22–32)
Calcium: 8.5 mg/dL — ABNORMAL LOW (ref 8.9–10.3)
Chloride: 95 mmol/L — ABNORMAL LOW (ref 98–111)
Creatinine, Ser: 0.76 mg/dL (ref 0.44–1.00)
GFR, Estimated: >60 mL/min (ref >60)
Glucose, Bld: 105 mg/dL — ABNORMAL HIGH (ref 70–99)
Potassium: 3.4 mmol/L — ABNORMAL LOW (ref 3.5–5.1)
Sodium: 136 mmol/L (ref 135–145)
Total Bilirubin: 0.5 mg/dL (ref 0.3–1.2)
Total Protein: 6.8 g/dL (ref 6.5–8.1)

## 2021-04-04 LAB — BLOOD GAS, VENOUS
Acid-Base Excess: 6.4 mmol/L — ABNORMAL HIGH (ref 0.0–2.0)
Bicarbonate: 35.4 mmol/L — ABNORMAL HIGH (ref 20.0–28.0)
O2 Saturation: 84.6 %
Patient temperature: 37
pCO2, Ven: 72 mmHg (ref 44–60)
pH, Ven: 7.3 (ref 7.25–7.43)
pO2, Ven: 56 mmHg — ABNORMAL HIGH (ref 32–45)

## 2021-04-04 LAB — STREP PNEUMONIAE URINARY ANTIGEN: Strep Pneumo Urinary Antigen: NEGATIVE

## 2021-04-04 LAB — HIV ANTIBODY (ROUTINE TESTING W REFLEX): HIV Screen 4th Generation wRfx: NONREACTIVE

## 2021-04-04 LAB — PHOSPHORUS: Phosphorus: 4.4 mg/dL (ref 2.5–4.6)

## 2021-04-04 MED ORDER — SERTRALINE HCL 50 MG PO TABS
200.0000 mg | ORAL_TABLET | Freq: Every day | ORAL | Status: DC
  Administered 2021-04-05: 200 mg via ORAL
  Filled 2021-04-04: qty 4

## 2021-04-04 MED ORDER — BUSPIRONE HCL 15 MG PO TABS
7.5000 mg | ORAL_TABLET | Freq: Two times a day (BID) | ORAL | Status: DC
  Administered 2021-04-04 – 2021-04-06 (×4): 7.5 mg via ORAL
  Filled 2021-04-04 (×5): qty 1

## 2021-04-04 MED ORDER — ALBUTEROL SULFATE HFA 108 (90 BASE) MCG/ACT IN AERS
2.0000 | INHALATION_SPRAY | Freq: Four times a day (QID) | RESPIRATORY_TRACT | Status: DC | PRN
  Filled 2021-04-04: qty 6.7

## 2021-04-04 MED ORDER — MONTELUKAST SODIUM 10 MG PO TABS
10.0000 mg | ORAL_TABLET | Freq: Every morning | ORAL | Status: DC
Start: 2021-04-05 — End: 2021-04-06
  Administered 2021-04-05 – 2021-04-06 (×2): 10 mg via ORAL
  Filled 2021-04-04: qty 1

## 2021-04-04 MED ORDER — DM-GUAIFENESIN ER 30-600 MG PO TB12
1.0000 | ORAL_TABLET | Freq: Two times a day (BID) | ORAL | Status: DC
  Administered 2021-04-04 – 2021-04-06 (×4): 1 via ORAL
  Filled 2021-04-04 (×5): qty 1

## 2021-04-04 NOTE — Assessment & Plan Note (Addendum)
Continue home propranolol, hold if heart rate under 60.  Counseled pt to checked HR at home prior to taking, and hold if HR under 60.  Follow up with PCP or cardiology.

## 2021-04-04 NOTE — Hospital Course (Addendum)
Annette Quinn is a 71 y.o. female with medical history significant for GERD, benign essential tremor, hyperlipidemia, who presented to the ED on 04/03/2021 home complaining of diarrhea and nausea without abdominal pain.    Evaluation in the ED revealed a right upper lobe pneumonia.  Initial spO2 was 85% on room air, required 2 L/min supplemental oxygen on admission.  UA appeared consistent with UTI as well.  She initially had a narrow complex tachycardia questionable for A-flutter vs paroxysmal A-fib.  HR's improved with IV fluids and antibiotics, in addition to resuming her home propranolol.  Admitted to [facility] service and started on empiric IV antibiotics pending cultures and further evaluation.

## 2021-04-04 NOTE — Assessment & Plan Note (Addendum)
Presented with confusion and abdominal complaints.  UA was consistent with infection. Urine culture growing Klebsiella pneumoniae. Started on empiric Rocephin. Blood cultures -negative to date Follow fever curve and CBC. Discharged with Omnicef & Zithromax for CAP.

## 2021-04-04 NOTE — Assessment & Plan Note (Signed)
Likely demand ischemia in the setting of acute respiratory failure with hypoxia.  Patient does not have any chest pain, EKG is nonacute. Troponin trended down.

## 2021-04-04 NOTE — Assessment & Plan Note (Addendum)
Present on admission with K3.0. Resolved with replacement. Repeat labs in follow up

## 2021-04-04 NOTE — Progress Notes (Signed)
Progress Note   Patient: Annette Quinn QMG:[id number] DOB: 12/01/1950 DOA: 04/03/2021     1 DOS: the patient was seen and examined on 04/04/2021   Brief hospital course: Annette Quinn is a 71 y.o. female with medical history significant for GERD, benign essential tremor, hyperlipidemia, who presented to the ED on 04/03/2021 home complaining of diarrhea and nausea without abdominal pain.    Evaluation in the ED revealed a right upper lobe pneumonia.  Initial spO2 was 85% on room air, required 2 L/min supplemental oxygen on admission.  UA appeared consistent with UTI as well.  She initially had a narrow complex tachycardia questionable for A-flutter vs paroxysmal A-fib.  HR's improved with IV fluids and antibiotics, in addition to resuming her home propranolol.  Admitted to [facility] service and started on empiric IV antibiotics pending cultures and further evaluation.  Assessment and Plan: * CAP (community acquired pneumonia)- (present on admission) Continue Rocephin and Zithromax. Follow cultures. Monitor fever curve, CBC. Follow-up strep pneumo and Legionella Ag's, mycoplasma Ab. Pulmonary hygiene.   Mucolytic's as needed.   Antipyretics as needed.  Acute respiratory failure with hypoxia ([place])- (present on admission) Present on admission, due to pneumonia.  Patient's O2 sat dropped to the mid 80s on room air, improved on 2 L/min nasal cannula O2. -- Supplement O2 to maintain sats above 90%, wean as tolerated -- Treat pneumonia as outlined -- Incentive spirometer  Diarrhea- (present on admission) C. difficile and GI panel are pending. Patient has not had a BM since admission when seen this morning.  Acute cystitis- (present on admission) Continue empiric Rocephin. Follow urine and blood cultures. Follow fever curve and CBC.  Elevated troponin- (present on admission) Likely demand ischemia in the setting of acute respiratory failure with hypoxia.  Patient does not have any chest  pain, EKG is nonacute. Troponin trended down.  Narrow complex tachycardia ([place])- (present on admission) This was noted on admission but improved after IV fluids and antibiotics were started.  Appeared likely paroxysmal A-fib as rhythm was irregular and P waves were occasionally absent.  Suspect this is driven by underlying infection.  Patient has no known prior history of A-fib or SVT. -- Monitor on telemetry -- Repeat EKG as needed for tachycardia -- Monitor and replace electrolytes -- Treat infection as outlined --Consider cardiology consult and ?  Need for anticoagulation  Hypokalemia- (present on admission) Present on admission with K3.0. K improved 3.4 this morning with replacement.  Further replacement underway. Monitor BMP replace K. Follow Mg levels as well  GERD (gastroesophageal reflux disease)- (present on admission) PPI was held on admission secondary to diarrhea as PPIs are considered to be a risk factor for C. difficile. -- Resume tomorrow if diarrhea improved  Benign essential tremor- (present on admission) Continue home propranolol  Community acquired pneumonia- (present on admission) Present on admission, complicated by acute respiratory failure with hypoxia.  CTA chest showed right upper lobe airspace opacity consistent with.  In the setting of recent nausea and vomiting, aspiration is possible etiology.   --Continue Rocephin and Zithromax. --Follow cultures. --Monitor fever curve, CBC. --Follow-up strep pneumo and Legionella Ag's, mycoplasma Ab. --Pulmonary hygiene.   --Mucolytic's as needed.   --Antipyretics as needed.         Subjective: Patient seen in the ER holding for a bed with husband at bedside today.  She is feeling okay, says she does not really know how she feels.  Fairly vague reports of symptoms.  She denies any bowel  movement or diarrhea since admission.  Denied having any cough or other respiratory symptoms to suspect pneumonia.  No dysuria  or urinary frequency.  Husband does report he noticed her being a bit confused.  No history of UTIs.  Physical Exam: Vitals:   04/04/21 0600 04/04/21 0900 04/04/21 1626 04/04/21 1659  BP: 118/66 108/65 (!) 105/57 (!) 97/52  Pulse: (!) 58 63 (!) 54 (!) 59  Resp: _0 Temp:    (!) 97.5 F (36.4 C)  TempSrc:    Oral  SpO2: 99% 97% 99% 91%  Weight:      Height:       General exam: awake, alert, no acute distress, obese HEENT: Wearing nasal cannula, moist mucus membranes, hearing grossly normal  Respiratory system: CTAB with right upper lung crackles, no expiratory wheezes, normal respiratory effort at rest.,  On 2 L/min [place] O2 Cardiovascular system: normal S1/S2, RRR, unable to visualize JVD, trace lower extremity edema.   Gastrointestinal system: soft, NT, ND, no HSM felt, +bowel sounds.  No suprapubic tenderness Central nervous system: A&O x3. no gross focal neurologic deficits, normal speech Extremities: moves all, no cyanosis, normal tone Skin: dry, intact, normal temperature, normal color, No rashes, lesions or ulcers seen on visualized skin Psychiatry: normal mood, congruent affect, judgement and insight appear normal   Data Reviewed:  Labs reviewed and notable for ABG with pH 7.3 PCO2 72 PO2 56 bicarb 35.4.  Next line CMP notable for K3.4, chloride 95, glucose 105, calcium 8.5 with albumin 3.0, alk phos 33.   Troponin down trended 60>> 50.  CBC unremarkable  Family Communication: Husband at bedside on rounds today  Disposition: Status is: Inpatient Remains inpatient appropriate because: Severity of illness on empiric IV antibiotics for UTI and pneumonia pending cultures.      Planned Discharge Destination: Home     Time spent: 35 minutes  Author: Ezekiel Slocumb, DO 04/04/2021 5:27 PM  For on call review [email or web address].

## 2021-04-04 NOTE — Assessment & Plan Note (Addendum)
Reported on admission, resolved. C. difficile and GI panel were canceled as patient no longer having diarrhea after admission.

## 2021-04-04 NOTE — Assessment & Plan Note (Addendum)
PPI was held on admission secondary to diarrhea as PPIs are considered to be a risk factor for C. difficile.  Diarrhea resolved spontaneously. -- Resume PPI

## 2021-04-04 NOTE — Assessment & Plan Note (Addendum)
Continue Rocephin and Zithromax. Pulmonary hygiene.   Mucolytic's as needed.   Antipyretics as needed. Incentive spirometer  Weaned off oxygen.  Clinically improved. Discharge on Omnicef and Zithromax.

## 2021-04-04 NOTE — Assessment & Plan Note (Addendum)
Present on admission, due to pneumonia.  Patient's O2 sat dropped to the mid 80s on room air, improved on 2 L/min nasal cannula O2. -- Supplement O2 to maintain sats above 90%, wean as tolerated -- Treat pneumonia as outlined -- Incentive spirometer Weaned to room air with stable O2 sats.

## 2021-04-04 NOTE — Assessment & Plan Note (Addendum)
This was noted on admission but improved after IV fluids and antibiotics were started.  Appeared likely paroxysmal A-fib as rhythm was irregular and P waves were occasionally absent.  Suspect this is driven by underlying infection.  Patient has no known prior history of A-fib or SVT. -- Monitor on telemetry -- Repeat EKG as needed for tachycardia -- Monitor and replace electrolytes -- Treat infection as outlined -- Outpatient cardiology follow up -- On propranolol

## 2021-04-04 NOTE — Assessment & Plan Note (Addendum)
As outlined

## 2021-04-05 ENCOUNTER — Inpatient Hospital Stay
Admit: 2021-04-05 | Discharge: 2021-04-05 | Disposition: A | Discharge status: Home / Self Care | Payer: Medicare HMO | Attending: Internal Medicine | Admitting: Internal Medicine

## 2021-04-05 DIAGNOSIS — N3001 Acute cystitis with hematuria: Secondary | ICD-10-CM | POA: Diagnosis not present

## 2021-04-05 DIAGNOSIS — J9601 Acute respiratory failure with hypoxia: Secondary | ICD-10-CM | POA: Diagnosis not present

## 2021-04-05 DIAGNOSIS — J189 Pneumonia, unspecified organism: Secondary | ICD-10-CM | POA: Diagnosis not present

## 2021-04-05 DIAGNOSIS — I5032 Chronic diastolic (congestive) heart failure: Secondary | ICD-10-CM | POA: Diagnosis present

## 2021-04-05 LAB — ECHOCARDIOGRAM COMPLETE
AR max vel: 1.8 cm2
AV Peak grad: 7.2 mmHg
Ao pk vel: 1.34 m/s
Area-P 1/2: 2.44 cm2
Calc EF: 56.1 %
Height: 66 in
S' Lateral: 3.5 cm
Single Plane A2C EF: 58.9 %
Single Plane A4C EF: 53.3 %
Weight: 3509.72 oz

## 2021-04-05 LAB — BASIC METABOLIC PANEL
Anion gap: 9 (ref 5–15)
BUN: 8 mg/dL (ref 8–23)
CO2: 31 mmol/L (ref 22–32)
Calcium: 8.4 mg/dL — ABNORMAL LOW (ref 8.9–10.3)
Chloride: 97 mmol/L — ABNORMAL LOW (ref 98–111)
Creatinine, Ser: 0.61 mg/dL (ref 0.44–1.00)
GFR, Estimated: >60 mL/min (ref >60)
Glucose, Bld: 96 mg/dL (ref 70–99)
Potassium: 3.4 mmol/L — ABNORMAL LOW (ref 3.5–5.1)
Sodium: 137 mmol/L (ref 135–145)

## 2021-04-05 LAB — URINE CULTURE: Culture: >=100000 — AB

## 2021-04-05 LAB — MAGNESIUM: Magnesium: 2.4 mg/dL (ref 1.7–2.4)

## 2021-04-05 MED ORDER — PANTOPRAZOLE SODIUM 40 MG PO TBEC
40.0000 mg | DELAYED_RELEASE_TABLET | Freq: Every day | ORAL | Status: DC
  Administered 2021-04-05 – 2021-04-06 (×2): 40 mg via ORAL
  Filled 2021-04-05 (×2): qty 1

## 2021-04-05 MED ORDER — BUMETANIDE 1 MG PO TABS
1.0000 mg | ORAL_TABLET | Freq: Every day | ORAL | Status: DC
  Administered 2021-04-05 – 2021-04-06 (×2): 1 mg via ORAL
  Filled 2021-04-05 (×2): qty 1

## 2021-04-05 MED ORDER — AZELASTINE-FLUTICASONE 137-50 MCG/ACT NA SUSP: 1.0000 | Freq: Two times a day (BID) | NASAL | Status: DC | PRN

## 2021-04-05 MED ORDER — VITAMIN D 25 MCG (1000 UNIT) PO TABS
2000.0000 [IU] | ORAL_TABLET | Freq: Every day | ORAL | Status: DC
  Administered 2021-04-06: 2000 [IU] via ORAL
  Filled 2021-04-05: qty 2

## 2021-04-05 MED ORDER — GABAPENTIN 300 MG PO CAPS
600.0000 mg | ORAL_CAPSULE | Freq: Three times a day (TID) | ORAL | Status: DC
Start: 2021-04-05 — End: 2021-04-06
  Administered 2021-04-05 – 2021-04-06 (×4): 600 mg via ORAL
  Filled 2021-04-05 (×4): qty 2

## 2021-04-05 MED ORDER — POTASSIUM CHLORIDE CRYS ER 20 MEQ PO TBCR: 40.0000 meq | EXTENDED_RELEASE_TABLET | ORAL | Status: DC

## 2021-04-05 MED ORDER — POTASSIUM CHLORIDE CRYS ER 20 MEQ PO TBCR
40.0000 meq | EXTENDED_RELEASE_TABLET | Freq: Once | ORAL | Status: AC
  Administered 2021-04-05: 40 meq via ORAL
  Filled 2021-04-05: qty 2

## 2021-04-05 NOTE — Progress Notes (Signed)
*  PRELIMINARY RESULTS* Echocardiogram 2D Echocardiogram has been performed.  Annette Quinn 04/05/2021, 9:36 AM

## 2021-04-05 NOTE — Assessment & Plan Note (Addendum)
Echo this admission showed low normal EF of 50 to 33%, grade 2 diastolic dysfunction.  Patient seems compensated and euvolemic with trace to minimal lower extremity edema.  She is on Bumex at home.  Also reports she has been on fluid restriction in the past.   -- Resumed home Bumex at reduced dose of 1 mg given soft blood pressure -- Resume 2 mg Bumex at discharge -- Pt counseled to monitor BP at home and hold Bumex if BP is low -- Monitor volume status, daily weights

## 2021-04-05 NOTE — Progress Notes (Addendum)
Progress Note   Patient: Annette Quinn ZLD:[id number] DOB: [birth year] DOA: 04/03/2021     2 DOS: the patient was seen and examined on 04/05/2021   Brief hospital course: Annette Quinn is a 71 y.o. female with medical history significant for GERD, benign essential tremor, hyperlipidemia, who presented to the ED on 04/03/2021 home complaining of diarrhea and nausea without abdominal pain.    Evaluation in the ED revealed a right upper lobe pneumonia.  Initial spO2 was 85% on room air, required 2 L/min supplemental oxygen on admission.  UA appeared consistent with UTI as well.  She initially had a narrow complex tachycardia questionable for A-flutter vs paroxysmal A-fib.  HR's improved with IV fluids and antibiotics, in addition to resuming her home propranolol.  Admitted to [facility] service and started on empiric IV antibiotics pending cultures and further evaluation.  Assessment and Plan: * CAP (community acquired pneumonia)- (present on admission) Continue Rocephin and Zithromax. Follow cultures. Monitor fever curve, CBC. Strep pneumo antigen negative Legionella Ag, mycoplasma Ab. still pending Pulmonary hygiene.   Mucolytic's as needed.   Antipyretics as needed. Incentive spirometer  Acute respiratory failure with hypoxia ([place])- (present on admission) Present on admission, due to pneumonia.  Patient's O2 sat dropped to the mid 80s on room air, improved on 2 L/min nasal cannula O2. -- Supplement O2 to maintain sats above 90%, wean as tolerated -- Treat pneumonia as outlined -- Incentive spirometer  Diarrhea- (present on admission) Reported on admission, resolved. C. difficile and GI panel were canceled as patient no longer having diarrhea after admission.  Acute cystitis- (present on admission) Presented with confusion and abdominal complaints.  UA was consistent with infection. Urine culture growing Klebsiella pneumoniae. Started on empiric Rocephin on admission. Follow blood  cultures -negative to date Follow fever curve and CBC.  Elevated troponin- (present on admission) Likely demand ischemia in the setting of acute respiratory failure with hypoxia.  Patient does not have any chest pain, EKG is nonacute. Troponin trended down.  Narrow complex tachycardia ([place])- (present on admission) This was noted on admission but improved after IV fluids and antibiotics were started.  Appeared likely paroxysmal A-fib as rhythm was irregular and P waves were occasionally absent.  Suspect this is driven by underlying infection.  Patient has no known prior history of A-fib or SVT. -- Monitor on telemetry -- Repeat EKG as needed for tachycardia -- Monitor and replace electrolytes -- Treat infection as outlined --Consider cardiology consult and ?  Need for anticoagulation  Hypokalemia- (present on admission) Present on admission with K3.0. K 3.4 yesterday and again this morning after replacement yesterday.   Further replacement underway. Monitor BMP replace K. Follow Mg levels as well  GERD (gastroesophageal reflux disease)- (present on admission) PPI was held on admission secondary to diarrhea as PPIs are considered to be a risk factor for C. difficile.  Diarrhea resolved spontaneously. -- Resume PPI  Benign essential tremor- (present on admission) Continue home propranolol, hold if heart rate under 60  Community acquired pneumonia- (present on admission) Continue Rocephin and Zithromax. Follow cultures. Monitor fever curve, CBC. Strep pneumo antigen negative Legionella Ag, mycoplasma Ab. still pending Pulmonary hygiene.   Mucolytic's as needed.   Antipyretics as needed. Incentive spirometer          Subjective: Patient awake sitting up in bed when seen today.  Husband arrived at bedside during encounter.  Patient reports feeling better today.  She is requesting regular diet (on clears since admission).  She  has not had any diarrhea since admission.  She  reports increased swelling in her legs with Bumex on hold.  Confirms she does not use home oxygen.  No abdominal pain, nausea vomiting, dysuria or frequency, fevers or chills  Physical Exam: Vitals:   04/05/21 0435 04/05/21 0437 04/05/21 0915 04/05/21 1624  BP:  (!) 108/53 (!) 112/56 105/68  Pulse:  (!) 52 (!) 50 64  Resp:  18 18 16   Temp:  97.8 F (36.6 C) 97.6 F (36.4 C) 98 F (36.7 C)  TempSrc:  Oral    SpO2:  97% 97% 93%  Weight: 99.5 kg     Height:       General exam: awake, alert, no acute distress, obese HEENT: moist mucus membranes, hearing grossly normal  Respiratory system: CTAB with diminished bases, no wheezes, rales or rhonchi, normal respiratory effort at rest, on 2 L/min [place] O2. Cardiovascular system: normal S1/S2, bradycardic, regular rhythm, trace lower extremity edema bilaterally Central nervous system: A&O x3. no gross focal neurologic deficits, normal speech Extremities: moves all, no cyanosis, normal tone Skin: dry, intact, normal temperature, normal color, No rashes seen on visualized skin Psychiatry: normal mood, congruent affect, judgement and insight appear normal   Data Reviewed:  Labs reviewed notable for potassium 3.4, chloride 97, calcium 8.4, troponin trended down from 50>> 32  Echocardiogram 2/26 shows EF 50 to 32%, grade 2 diastolic dysfunction, no significant valvular pathology   Family Communication: Husband arrived at bedside during encounter this morning   Disposition: Status is: Inpatient Remains inpatient appropriate because: Remains on supplemental oxygen which she does not need at baseline, IV antibiotics and has electrolyte abnormalities warranting close monitoring for replacement        Planned Discharge Destination: Home     Time spent: 35 minutes  Author: Ezekiel Slocumb, DO 04/05/2021 5:03 PM  For on call review [email or web address].

## 2021-04-05 NOTE — Progress Notes (Signed)
Patient notably with more swelling to BLE and BUE, +1 edema. Reports hx of fluid retention and is on Bumex at home. Daily weight shown an increase in weight. No acute symptoms of distress at this time. Will continue to follow current plan of care.

## 2021-04-06 LAB — BASIC METABOLIC PANEL
Anion gap: 15 (ref 5–15)
BUN: 9 mg/dL (ref 8–23)
CO2: 30 mmol/L (ref 22–32)
Calcium: 8.7 mg/dL — ABNORMAL LOW (ref 8.9–10.3)
Chloride: 98 mmol/L (ref 98–111)
Creatinine, Ser: 0.69 mg/dL (ref 0.44–1.00)
GFR, Estimated: >60 mL/min (ref >60)
Glucose, Bld: 90 mg/dL (ref 70–99)
Potassium: 3.8 mmol/L (ref 3.5–5.1)
Sodium: 143 mmol/L (ref 135–145)

## 2021-04-06 LAB — MYCOPLASMA PNEUMONIAE ANTIBODY, IGM: Mycoplasma pneumo IgM: <770 U/mL (ref 0–769)

## 2021-04-06 MED ORDER — CEFDINIR 300 MG PO CAPS: 300.0000 mg | ORAL_CAPSULE | Freq: Two times a day (BID) | ORAL | 0 refills | Status: AC

## 2021-04-06 MED ORDER — PROPRANOLOL HCL 20 MG PO TABS: 20.0000 mg | ORAL_TABLET | Freq: Two times a day (BID) | ORAL | Status: AC

## 2021-04-06 MED ORDER — AZITHROMYCIN 500 MG PO TABS: 500.0000 mg | ORAL_TABLET | Freq: Every day | ORAL | 0 refills | Status: AC

## 2021-04-06 NOTE — Plan of Care (Signed)

## 2021-04-06 NOTE — Progress Notes (Signed)
Discharge instructions reviewed with pt. She verbalized understanding of instructions. Discharge packet given to pt. Telephone returned to pt. Room checked for belongings prior to pt being escorted out by staff

## 2021-04-06 NOTE — TOC Initial Note (Signed)
Transition of Care [facility]) - Initial/Assessment Note    Patient Details  Name: Annette Quinn MRN: [id number] Date of Birth: [birth year]  Transition of Care [facility]) CM/SW Contact:    Conception Oms, RN Phone Number: 04/06/2021, 9:02 AM  Clinical Narrative:         Transition of Care (TOC) Screening Note   Patient Details  Name: Annette Quinn Date of Birth: 1950-07-17   Transition of Care [facility]) CM/SW Contact:    Conception Oms, RN Phone Number: 04/06/2021, 9:03 AM    Transition of Care Department [facility]) has reviewed patient and no TOC needs have been identified at this time. We will continue to monitor patient advancement through interdisciplinary progression rounds. If new patient transition needs arise, please place a TOC consult.                   Patient Goals and CMS Choice        Expected Discharge Plan and Services                                                Prior Living Arrangements/Services                       Activities of Daily Living Home Assistive Devices/Equipment: Cane (specify quad or straight), Walker (specify type) ADL Screening (condition at time of admission) Patient's cognitive ability adequate to safely complete daily activities?: Yes Is the patient deaf or have difficulty hearing?: No Does the patient have difficulty seeing, even when wearing glasses/contacts?: No Does the patient have difficulty concentrating, remembering, or making decisions?: No Patient able to express need for assistance with ADLs?: Yes Does the patient have difficulty dressing or bathing?: No Independently performs ADLs?: Yes (appropriate for developmental age) Does the patient have difficulty walking or climbing stairs?: Yes Weakness of Legs: Both Weakness of Arms/Hands: None  Permission Sought/Granted                  Emotional Assessment              Admission diagnosis:  Dehydration [E86.0] Hypokalemia  [E87.6] Community acquired pneumonia [J18.9] CAP (community acquired pneumonia) [J18.9] Acute cystitis without hematuria [N30.00] Acute respiratory failure with hypoxia (HCC) [J96.01] Atrial flutter, unspecified type ([place]) [I48.92] Diarrhea, unspecified type [R19.7] Community acquired pneumonia of right lung, unspecified part of lung [J18.9] Patient Active Problem List   Diagnosis Date Noted   Chronic diastolic CHF (congestive heart failure) ([place]) 04/05/2021   Acute respiratory failure with hypoxia ([place]) 04/04/2021   Community acquired pneumonia 04/04/2021   CAP (community acquired pneumonia) 04/03/2021   Acute cystitis 04/03/2021   Narrow complex tachycardia ([place]) 04/03/2021   Elevated troponin 04/03/2021   Diarrhea 04/03/2021   Hypokalemia 04/03/2021   GERD (gastroesophageal reflux disease)    Fall down steps 07/06/2019   Fracture of distal humerus 07/06/2019   Accidental fall 07/05/2019   Peripheral neuropathy 07/05/2019   Benign essential tremor 07/05/2019   Obstructive sleep apnea 07/05/2019   Fracture of distal end of left humerus 07/05/2019   Closed fracture of distal end of left radius 07/05/2019   Acute on chronic respiratory failure with hypoxemia ([place]) 07/05/2019   Personal history of fall 07/05/2019   Wrist fracture, closed, left, initial encounter 07/05/2019   Scalp laceration, initial encounter 07/05/2019  Status post reverse total shoulder replacement, 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

## 2021-04-06 NOTE — Plan of Care (Signed)
Problem: Education: Goal: Knowledge of General Education information will improve Description: Including pain rating scale, medication(s)/side effects and non-pharmacologic comfort measures 04/06/2021 0954 by Derrek Monaco, RN Outcome: Adequate for Discharge 04/06/2021 0954 by Derrek Monaco, RN Outcome: Progressing 04/06/2021 0953 by Derrek Monaco, RN Outcome: Adequate for Discharge   Problem: Health Behavior/Discharge Planning: Goal: Ability to manage health-related needs will improve 04/06/2021 0954 by Derrek Monaco, RN Outcome: Adequate for Discharge 04/06/2021 0954 by Derrek Monaco, RN Outcome: Progressing 04/06/2021 0953 by Derrek Monaco, RN Outcome: Adequate for Discharge   Problem: Clinical Measurements: Goal: Ability to maintain clinical measurements within normal limits will improve 04/06/2021 0954 by Derrek Monaco, RN Outcome: Adequate for Discharge 04/06/2021 0954 by Derrek Monaco, RN Outcome: Progressing 04/06/2021 0953 by Derrek Monaco, RN Outcome: Adequate for Discharge Goal: Will remain free from infection 04/06/2021 0954 by Derrek Monaco, RN Outcome: Adequate for Discharge 04/06/2021 0954 by Derrek Monaco, RN Outcome: Progressing 04/06/2021 0953 by Derrek Monaco, RN Outcome: Adequate for Discharge Goal: Diagnostic test results will improve 04/06/2021 0954 by Derrek Monaco, RN Outcome: Adequate for Discharge 04/06/2021 0954 by Derrek Monaco, RN Outcome: Progressing 04/06/2021 0953 by Derrek Monaco, RN Outcome: Adequate for Discharge Goal: Respiratory complications will improve 04/06/2021 0954 by Derrek Monaco, RN Outcome: Adequate for Discharge 04/06/2021 0954 by Derrek Monaco, RN Outcome: Progressing 04/06/2021 0953 by Derrek Monaco, RN Outcome: Adequate for Discharge Goal: Cardiovascular complication will be avoided 04/06/2021 0954 by Derrek Monaco, RN Outcome: Adequate for  Discharge 04/06/2021 0954 by Derrek Monaco, RN Outcome: Progressing 04/06/2021 0953 by Derrek Monaco, RN Outcome: Adequate for Discharge   Problem: Activity: Goal: Risk for activity intolerance will decrease 04/06/2021 0954 by Derrek Monaco, RN Outcome: Adequate for Discharge 04/06/2021 [phone] by Derrek Monaco, RN Outcome: Progressing 04/06/2021 0953 by Derrek Monaco, RN Outcome: Adequate for Discharge   Problem: Nutrition: Goal: Adequate nutrition will be maintained 04/06/2021 0954 by Derrek Monaco, RN Outcome: Adequate for Discharge 04/06/2021 0954 by Derrek Monaco, RN Outcome: Progressing 04/06/2021 0953 by Derrek Monaco, RN Outcome: Adequate for Discharge   Problem: Coping: Goal: Level of anxiety will decrease 04/06/2021 0954 by Derrek Monaco, RN Outcome: Adequate for Discharge 04/06/2021 0954 by Derrek Monaco, RN Outcome: Progressing 04/06/2021 0953 by Derrek Monaco, RN Outcome: Adequate for Discharge   Problem: Elimination: Goal: Will not experience complications related to bowel motility 04/06/2021 0954 by Derrek Monaco, RN Outcome: Adequate for Discharge 04/06/2021 0954 by Derrek Monaco, RN Outcome: Progressing 04/06/2021 0953 by Derrek Monaco, RN Outcome: Adequate for Discharge Goal: Will not experience complications related to urinary retention 04/06/2021 0954 by Derrek Monaco, RN Outcome: Adequate for Discharge 04/06/2021 0954 by Derrek Monaco, RN Outcome: Progressing 04/06/2021 0953 by Derrek Monaco, RN Outcome: Adequate for Discharge   Problem: Pain Managment: Goal: General experience of comfort will improve 04/06/2021 0954 by Derrek Monaco, RN Outcome: Adequate for Discharge 04/06/2021 0954 by Derrek Monaco, RN Outcome: Progressing 04/06/2021 0953 by Derrek Monaco, RN Outcome: Adequate for Discharge   Problem: Safety: Goal: Ability to remain free from injury will  improve 04/06/2021 0954 by Derrek Monaco, RN Outcome: Adequate for Discharge 04/06/2021 0954 by Derrek Monaco, RN Outcome: Progressing 04/06/2021 0953 by Derrek Monaco, RN Outcome: Adequate for Discharge   Problem: Skin Integrity: Goal: Risk for impaired skin integrity will  decrease 04/06/2021 0954 by Derrek Monaco, RN Outcome: Adequate for Discharge 04/06/2021 [phone] by Derrek Monaco, RN Outcome: Progressing 04/06/2021 0953 by Derrek Monaco, RN Outcome: Adequate for Discharge   Problem: Activity: Goal: Ability to tolerate increased activity will improve 04/06/2021 0954 by Derrek Monaco, RN Outcome: Adequate for Discharge 04/06/2021 [phone] by Derrek Monaco, RN Outcome: Progressing   Problem: Clinical Measurements: Goal: Ability to maintain a body temperature in the normal range will improve 04/06/2021 0954 by Derrek Monaco, RN Outcome: Adequate for Discharge 04/06/2021 0954 by Derrek Monaco, RN Outcome: Progressing   Problem: Respiratory: Goal: Ability to maintain adequate ventilation will improve 04/06/2021 0954 by Derrek Monaco, RN Outcome: Adequate for Discharge 04/06/2021 0954 by Derrek Monaco, RN Outcome: Progressing Goal: Ability to maintain a clear airway will improve 04/06/2021 0954 by Derrek Monaco, RN Outcome: Adequate for Discharge 04/06/2021 0954 by Derrek Monaco, RN Outcome: Progressing

## 2021-04-06 NOTE — Discharge Summary (Signed)
Physician Discharge Summary   Patient: Annette Quinn MRN: [id number] DOB: 09-22-1950  Admit date:     04/03/2021  Discharge date: 04/09/21  Discharge Physician: Ezekiel Slocumb   PCP: Kirk Ruths, MD   Recommendations at discharge:   Follow up on resolution of UTI and PNA after completing antibiotics Follow up with PCP in 1-2 weeks Repeat BMP/CBC/Mg in 1-2 weeks Follow up on patient's Heart Rate with beta blocker use. Propranolol was held at times during admission due to baseline bradycardia.  Discharge Diagnoses: Principal Problem:   CAP (community acquired pneumonia) Active Problems:   Benign essential tremor   Acute cystitis   Narrow complex tachycardia (HCC)   Elevated troponin   Diarrhea   Hypokalemia   GERD (gastroesophageal reflux disease)   Acute respiratory failure with hypoxia (HCC)   Community acquired pneumonia   Chronic diastolic CHF (congestive heart failure) ([place])  Resolved Problems:   * No resolved hospital problems. [facility] Course: Annette Quinn is a 71 y.o. female with medical history significant for GERD, benign essential tremor, hyperlipidemia, who presented to the ED on 04/03/2021 home complaining of diarrhea and nausea without abdominal pain.    Evaluation in the ED revealed a right upper lobe pneumonia.  Initial spO2 was 85% on room air, required 2 L/min supplemental oxygen on admission.  UA appeared consistent with UTI as well.  She initially had a narrow complex tachycardia questionable for A-flutter vs paroxysmal A-fib.  HR's improved with IV fluids and antibiotics, in addition to resuming her home propranolol.  Admitted to [facility] service and started on empiric IV antibiotics pending cultures and further evaluation.  Assessment and Plan: * CAP (community acquired pneumonia) Continue Rocephin and Zithromax. Pulmonary hygiene.   Mucolytic's as needed.   Antipyretics as needed. Incentive spirometer  Weaned off oxygen.   Clinically improved. Discharge on Omnicef and Zithromax.  Chronic diastolic CHF (congestive heart failure) (HCC) Echo this admission showed low normal EF of 50 to 40%, grade 2 diastolic dysfunction.  Patient seems compensated and euvolemic with trace to minimal lower extremity edema.  She is on Bumex at home.  Also reports she has been on fluid restriction in the past.   -- Resumed home Bumex at reduced dose of 1 mg given soft blood pressure -- Resume 2 mg Bumex at discharge -- Pt counseled to monitor BP at home and hold Bumex if BP is low -- Monitor volume status, daily weights  Community acquired pneumonia As outlined   Acute respiratory failure with hypoxia ([place]) Present on admission, due to pneumonia.  Patient's O2 sat dropped to the mid 80s on room air, improved on 2 L/min nasal cannula O2. -- Supplement O2 to maintain sats above 90%, wean as tolerated -- Treat pneumonia as outlined -- Incentive spirometer Weaned to room air with stable O2 sats.  GERD (gastroesophageal reflux disease) PPI was held on admission secondary to diarrhea as PPIs are considered to be a risk factor for C. difficile.  Diarrhea resolved spontaneously. -- Resume PPI  Hypokalemia Present on admission with K3.0. Resolved with replacement. Repeat labs in follow up  Diarrhea Reported on admission, resolved. C. difficile and GI panel were canceled as patient no longer having diarrhea after admission.  Elevated troponin Likely demand ischemia in the setting of acute respiratory failure with hypoxia.  Patient does not have any chest pain, EKG is nonacute. Troponin trended down.  Narrow complex tachycardia ([place]) This was noted on admission but improved after  IV fluids and antibiotics were started.  Appeared likely paroxysmal A-fib as rhythm was irregular and P waves were occasionally absent.  Suspect this is driven by underlying infection.  Patient has no known prior history of A-fib or SVT. -- Monitor on  telemetry -- Repeat EKG as needed for tachycardia -- Monitor and replace electrolytes -- Treat infection as outlined -- Outpatient cardiology follow up -- On propranolol  Acute cystitis Presented with confusion and abdominal complaints.  UA was consistent with infection. Urine culture growing Klebsiella pneumoniae. Started on empiric Rocephin. Blood cultures -negative to date Follow fever curve and CBC. Discharged with Omnicef & Zithromax for CAP.  Benign essential tremor Continue home propranolol, hold if heart rate under 60.  Counseled pt to checked HR at home prior to taking, and hold if HR under 60.  Follow up with PCP or cardiology.           Consultants: none Procedures performed: Echo Disposition: Home Diet recommendation:  Discharge Diet Orders (From admission, onward)     Start     Ordered   04/06/21 0000  Diet - low sodium heart healthy        04/06/21 0931           Cardiac diet  DISCHARGE MEDICATION: Allergies as of 04/06/2021       Reactions   Actonel [risedronate Sodium]    Boniva [ibandronic Acid]    Fosamax [alendronate]    Severe muscle aches   Lopid [gemfibrozil]    headache   Penicillins Other (See Comments)   (injectable only) - knot at site Has patient had a PCN reaction causing immediate rash, facial/tongue/throat swelling, SOB or lightheadedness with hypotension:NO Has patient had a PCN reaction causing severe rash involving mucus membranes or skin necrosis: No Has patient had a PCN reaction that required hospitalization: No Has patient had a PCN reaction occurring within the last 10 years: No If all of the above answers are "NO", then may proceed with Cephalosporin use.   Reclast [zoledronic Acid]    Severe muscle aches   Strawberry (diagnostic) Itching   Tape    Tears skin (tegaderm and paper tape OK)   Doxycycline Rash        Medication List     TAKE these medications    acetaminophen 650 MG CR tablet Commonly known  as: TYLENOL Take 650-1,300 mg by mouth every 8 (eight) hours as needed for pain.   albuterol 108 (90 Base) MCG/ACT inhaler Commonly known as: VENTOLIN HFA Inhale 2 puffs into the lungs every 6 (six) hours as needed for wheezing or shortness of breath.   Azelastine-Fluticasone 137-50 MCG/ACT Susp Place 1 spray into the nose 2 (two) times daily as needed (for allergies.).   bumetanide 2 MG tablet Commonly known as: BUMEX Take 2 mg by mouth daily.   busPIRone 7.5 MG tablet Commonly known as: BUSPAR Take 7.5 mg by mouth 2 (two) times daily.   Cholecalciferol 50 MCG (2000 UT) Caps Take 2,000 Units by mouth daily before breakfast.   fenofibrate 160 MG tablet Take 160 mg by mouth daily at 12 noon.   gabapentin 300 MG capsule Commonly known as: NEURONTIN Take 600 mg by mouth 3 (three) times daily.   GLUCOSAMINE-CHONDROITIN PO Take 1 tablet by mouth daily.   montelukast 10 MG tablet Commonly known as: SINGULAIR Take 10 mg by mouth every morning.   Oyster Shell Calcium 500-400 MG-UNIT Tabs Take 1 tablet by mouth daily. Notes to patient: Not given  in hospital   pantoprazole 40 MG tablet Commonly known as: PROTONIX Take 40 mg by mouth See admin instructions. Take 1 tablet (40 mg) scheduled every morning; may take additional dose later if needed   propranolol 20 MG tablet Commonly known as: INDERAL Take 1 tablet (20 mg total) by mouth 2 (two) times daily. Hold if heart rate less than 60. What changed: additional instructions   raloxifene 60 MG tablet Commonly known as: EVISTA Take 60 mg by mouth daily at 12 noon.   sertraline 100 MG tablet Commonly known as: ZOLOFT Take 200 mg by mouth daily with lunch.       ASK your doctor about these medications    azithromycin 500 MG tablet Commonly known as: Zithromax Take 1 tablet (500 mg total) by mouth daily for 2 days. Take 1 tablet daily for 3 days. Ask about: Should I take this medication?   cefdinir 300 MG  capsule Commonly known as: OMNICEF Take 1 capsule (300 mg total) by mouth 2 (two) times daily for 2 days. Ask about: Should I take this medication?         Discharge Exam: Filed Weights   04/03/21 1432 04/05/21 0435 04/06/21 0514  Weight: 94.3 kg 99.5 kg 98.5 kg   General exam: awake, alert, no acute distress HEENT: atraumatic, clear conjunctiva, anicteric sclera, moist mucus membranes, hearing grossly normal  Respiratory system: CTAB, no wheezes, rales or rhonchi, normal respiratory effort. Cardiovascular system: normal S1/S2, RRR, no JVD, murmurs, rubs, gallops, no pedal edema.   Gastrointestinal system: soft, NT, ND, no HSM felt, +bowel sounds. Central nervous system: A&O x3. no gross focal neurologic deficits, normal speech Extremities: moves all , no edema, normal tone Skin: dry, intact, normal temperature Psychiatry: normal mood, congruent affect, judgement and insight appear normal   Condition at discharge: stable  The results of significant diagnostics from this hospitalization (including imaging, microbiology, ancillary and laboratory) are listed below for reference.   Imaging Studies: DG Chest 2 View  Result Date: 04/03/2021 CLINICAL DATA:  Borderline hypoxia, several episodes of diarrhea per day for 5 days, weakness, dehydration EXAM: CHEST - 2 VIEW COMPARISON:  07/05/2019 FINDINGS: Upper normal heart size. Mediastinal contours and pulmonary vascularity normal. Atherosclerotic calcification aorta. Chronic peribronchial thickening without pulmonary infiltrate, pleural effusion, or pneumothorax. Bones demineralized. LEFT shoulder prosthesis noted. IMPRESSION: Chronic bronchitic changes without infiltrate. Aortic Atherosclerosis (ICD10-I70.0). Electronically Signed   By: Lavonia Dana M.D.   On: 04/03/2021 16:21   CT Angio Chest PE W and/or Wo Contrast  Result Date: 04/03/2021 CLINICAL DATA:  Hypoxia. EXAM: CT ANGIOGRAPHY CHEST WITH CONTRAST TECHNIQUE: Multidetector CT  imaging of the chest was performed using the standard protocol during bolus administration of intravenous contrast. Multiplanar CT image reconstructions and MIPs were obtained to evaluate the vascular anatomy. RADIATION DOSE REDUCTION: This exam was performed according to the departmental dose-optimization program which includes automated exposure control, adjustment of the mA and/or kV according to patient size and/or use of iterative reconstruction technique. CONTRAST:  51mL OMNIPAQUE IOHEXOL 350 MG/ML SOLN COMPARISON:  None. FINDINGS: Cardiovascular: There is marked severity calcification of the aortic arch. The ascending thoracic aorta measures approximately 8.8 cm in diameter. There is no evidence of aortic dissection. Satisfactory opacification of the pulmonary arteries to the segmental level. No evidence of pulmonary embolism. Normal heart size. A trace amount of pericardial fluid is seen. Mediastinum/Nodes: There is mild right hilar lymphadenopathy. Mild AP window and paratracheal lymphadenopathy is also seen. Thyroid gland, trachea, and esophagus  demonstrate no significant findings. Lungs/Pleura: A 2.4 cm x 2.1 cm area of patchy airspace disease is seen within the posterior aspect of the right upper lobe. A 4 mm, solid noncalcified lung nodule is seen within the lateral aspect of the right upper lobe (axial CT image 26, CT series 6). A 9 mm area of focal scarring versus noncalcified lung nodule is seen within the anterolateral aspect of the left upper lobe (axial CT image 24, CT series 6). Mild areas of atelectasis are seen within the inferior aspect of the left upper lobe and posterior aspect of the bilateral lung bases. There is no evidence of a pleural effusion or pneumothorax. Upper Abdomen: No acute abnormality. Musculoskeletal: Multilevel degenerative changes seen throughout the thoracic spine. Review of the MIP images confirms the above findings. IMPRESSION: 1. No evidence of pulmonary embolism. 2.  2.4 cm x 2.1 cm area of patchy airspace disease within the posterior aspect of the right upper lobe, concerning for pneumonia. Follow-up to resolution is recommended, as an underlying neoplastic process cannot be excluded. 3. 4 mm, solid noncalcified right upper lobe lung nodule with a 9 mm area of focal scarring versus noncalcified lung nodule within the left upper lobe. Non-contrast chest CT at 3-6 months is recommended. If the nodules are stable at time of repeat CT, then future CT at 18-24 months (from today's scan) is considered optional for low-risk patients, but is recommended for high-risk patients. This recommendation follows the consensus statement: Guidelines for Management of Incidental Pulmonary Nodules Detected on CT Images: From the Fleischner Society 2017; Radiology 2017; 284:228-243. Electronically Signed   By: Virgina Norfolk M.D.   On: 04/03/2021 18:51   CT ABDOMEN PELVIS W CONTRAST  Result Date: 04/03/2021 CLINICAL DATA:  Abdominal pain, acute, nonlocalized EXAM: CT ABDOMEN AND PELVIS WITH CONTRAST TECHNIQUE: Multidetector CT imaging of the abdomen and pelvis was performed using the standard protocol following bolus administration of intravenous contrast. RADIATION DOSE REDUCTION: This exam was performed according to the departmental dose-optimization program which includes automated exposure control, adjustment of the mA and/or kV according to patient size and/or use of iterative reconstruction technique. CONTRAST:  176mL OMNIPAQUE IOHEXOL 300 MG/ML  SOLN COMPARISON:  None. FINDINGS: Lower chest: No acute abnormality. Hepatobiliary: No focal liver abnormality is seen. Gallstones are present. No gallbladder wall thickening or biliary dilatation. Pancreas: Unremarkable. Spleen: Unremarkable. Adrenals/Urinary Tract: Circumferential bladder wall thickening probably due to under distension. Kidneys are unremarkable. Adrenals are unremarkable. Stomach/Bowel: Stomach is within normal limits.  Small duodenal diverticulum. Bowel is normal in caliber. Sigmoid diverticulosis. Vascular/Lymphatic: Atherosclerosis.  No enlarged nodes. Reproductive: Status post hysterectomy. No adnexal masses. Other: No free fluid.  Abdominal wall is unremarkable. Musculoskeletal: Degenerative changes of the included spine. IMPRESSION: No acute abnormality. Cholelithiasis. Sigmoid diverticulosis. Electronically Signed   By: Macy Mis M.D.   On: 04/03/2021 16:39   ECHOCARDIOGRAM COMPLETE  Result Date: 04/05/2021    ECHOCARDIOGRAM REPORT   Patient Name:   Annette Quinn Date of Exam: 04/05/2021 Medical Rec #:  [id number]         Height:       66.0 in Accession #:    [id number]        Weight:       219.4 lb Date of Birth:  09/02/1950         BSA:          2.080 m Patient Age:    74 years  BP:           108/53 mmHg Patient Gender: F                 HR:           49 bpm. Exam Location:  [facility] Procedure: 2D Echo Indications:     Elevated Troponin  History:         Patient has no prior history of Echocardiogram examinations.  Sonographer:     Kathlen Brunswick RDCS Referring Phys:  [id number] Rhetta Mura Diagnosing Phys: Yolonda Kida MD IMPRESSIONS  1. Left ventricular ejection fraction, by estimation, is 50 to 55%. The left ventricle has low normal function. The left ventricle has no regional wall motion abnormalities. The left ventricular internal cavity size was mildly dilated. Left ventricular diastolic parameters are consistent with Grade II diastolic dysfunction (pseudonormalization).  2. Right ventricular systolic function is normal. The right ventricular size is normal.  3. The mitral valve is normal in structure. No evidence of mitral valve regurgitation.  4. The aortic valve is normal in structure. Aortic valve regurgitation is not visualized. FINDINGS  Left Ventricle: Left ventricular ejection fraction, by estimation, is 50 to 55%. The left ventricle has low normal function. The left ventricle has no  regional wall motion abnormalities. The left ventricular internal cavity size was mildly dilated. There is no left ventricular hypertrophy. Left ventricular diastolic parameters are consistent with Grade II diastolic dysfunction (pseudonormalization). Right Ventricle: The right ventricular size is normal. No increase in right ventricular wall thickness. Right ventricular systolic function is normal. Left Atrium: Left atrial size was normal in size. Right Atrium: Right atrial size was normal in size. Pericardium: There is no evidence of pericardial effusion. Mitral Valve: The mitral valve is normal in structure. No evidence of mitral valve regurgitation. Tricuspid Valve: The tricuspid valve is normal in structure. Tricuspid valve regurgitation is not demonstrated. Aortic Valve: The aortic valve is normal in structure. Aortic valve regurgitation is not visualized. Aortic valve peak gradient measures 7.2 mmHg. Pulmonic Valve: The pulmonic valve was grossly normal. Pulmonic valve regurgitation is not visualized. Aorta: The ascending aorta was not well visualized. IAS/Shunts: No atrial level shunt detected by color flow Doppler. Additional Comments: There is no pleural effusion.  LEFT VENTRICLE PLAX 2D LVIDd:         4.80 cm     Diastology LVIDs:         3.50 cm     LV e' medial:    6.64 cm/s LV PW:         0.90 cm     LV E/e' medial:  14.0 LV IVS:        0.90 cm     LV e' lateral:   8.49 cm/s LVOT diam:     1.90 cm     LV E/e' lateral: 11.0 LV SV:         60 LV SV Index:   29 LVOT Area:     2.84 cm  LV Volumes (MOD) LV vol d, MOD A2C: 80.1 ml LV vol d, MOD A4C: 47.8 ml LV vol s, MOD A2C: 32.9 ml LV vol s, MOD A4C: 22.3 ml LV SV MOD A2C:     47.2 ml LV SV MOD A4C:     47.8 ml LV SV MOD BP:      35.1 ml RIGHT VENTRICLE RV Basal diam:  3.70 cm RV S prime:     10.20 cm/s TAPSE (M-mode): 1.6  cm LEFT ATRIUM             Index        RIGHT ATRIUM           Index LA diam:        4.00 cm 1.92 cm/m   RA Area:     22.10 cm LA  Vol (A2C):   44.6 ml 21.44 ml/m  RA Volume:   75.40 ml  36.25 ml/m LA Vol (A4C):   49.7 ml 23.90 ml/m LA Biplane Vol: 46.9 ml 22.55 ml/m  AORTIC VALVE                 PULMONIC VALVE AV Area (Vmax): 1.80 cm     PV Vmax:          0.80 m/s AV Vmax:        134.00 cm/s  PV Peak grad:     2.6 mmHg AV Peak Grad:   7.2 mmHg     PR End Diast Vel: 6.15 msec LVOT Vmax:      84.90 cm/s LVOT Vmean:     56.600 cm/s LVOT VTI:       0.213 m  AORTA Ao Root diam: 3.10 cm Ao Asc diam:  3.40 cm MITRAL VALVE               TRICUSPID VALVE MV Area (PHT): 2.44 cm    TV Peak grad:   21.6 mmHg MV Decel Time: 311 msec    TV Vmax:        2.33 m/s MV E velocity: 93.00 cm/s MV A velocity: 63.80 cm/s  SHUNTS MV E/A ratio:  1.46        Systemic VTI:  0.21 m                            Systemic Diam: 1.90 cm Yolonda Kida MD Electronically signed by Yolonda Kida MD Signature Date/Time: 04/05/2021/2:13:05 PM    Final     Microbiology: Results for orders placed or performed during the hospital encounter of 04/03/21  Urine Culture     Status: Abnormal   Collection Time: 04/03/21  3:26 PM   Specimen: Urine, Random  Result Value Ref Range Status   Specimen Description   Final    URINE, RANDOM Performed at [facility], [address] [place], [place] [address]    Special Requests   Final    NONE Performed at [facility], [address] [place], [place] [address]    Culture >=100,000 COLONIES/mL KLEBSIELLA PNEUMONIAE (A)  Final   Report Status 04/05/2021 FINAL  Final   Organism ID, Bacteria KLEBSIELLA PNEUMONIAE (A)  Final      Susceptibility   Klebsiella pneumoniae - MIC*    AMPICILLIN RESISTANT Resistant     CEFAZOLIN <=4 SENSITIVE Sensitive     CEFEPIME <=0.12 SENSITIVE Sensitive     CEFTRIAXONE <=0.25 SENSITIVE Sensitive     CIPROFLOXACIN <=0.25 SENSITIVE Sensitive     GENTAMICIN <=1 SENSITIVE Sensitive     IMIPENEM 1 SENSITIVE Sensitive     NITROFURANTOIN 64 INTERMEDIATE Intermediate      TRIMETH/SULFA <=20 SENSITIVE Sensitive     AMPICILLIN/SULBACTAM 4 SENSITIVE Sensitive     PIP/TAZO <=4 SENSITIVE Sensitive     * >=100,000 COLONIES/mL KLEBSIELLA PNEUMONIAE  Resp Panel by RT-PCR (Flu A&B, Covid) Nasopharyngeal Swab     Status: None   Collection Time: 04/03/21  3:31  PM   Specimen: Nasopharyngeal Swab; Nasopharyngeal(NP) swabs in vial transport medium  Result Value Ref Range Status   SARS Coronavirus 2 by RT PCR NEGATIVE NEGATIVE Final    Comment: (NOTE) SARS-CoV-2 target nucleic acids are NOT DETECTED.  The SARS-CoV-2 RNA is generally detectable in upper respiratory specimens during the acute phase of infection. The lowest concentration of SARS-CoV-2 viral copies this assay can detect is 138 copies/mL. A negative result does not preclude SARS-Cov-2 infection and should not be used as the sole basis for treatment or other patient management decisions. A negative result may occur with  improper specimen collection/handling, submission of specimen other than nasopharyngeal swab, presence of viral mutation(s) within the areas targeted by this assay, and inadequate number of viral copies(<138 copies/mL). A negative result must be combined with clinical observations, patient history, and epidemiological information. The expected result is Negative.  Fact Sheet for Patients:  EntrepreneurPulse.com.au  Fact Sheet for Healthcare Providers:  IncredibleEmployment.be  This test is no t yet approved or cleared by the [place] FDA and  has been authorized for detection and/or diagnosis of SARS-CoV-2 by FDA under an Emergency Use Authorization (EUA). This EUA will remain  in effect (meaning this test can be used) for the duration of the COVID-19 declaration under Section 564(b)(1) of the Act, 21 U.S.C.section 360bbb-3(b)(1), unless the authorization is terminated  or revoked sooner.       Influenza A by PCR NEGATIVE NEGATIVE Final    Influenza B by PCR NEGATIVE NEGATIVE Final    Comment: (NOTE) The Xpert Xpress SARS-CoV-2/FLU/RSV plus assay is intended as an aid in the diagnosis of influenza from Nasopharyngeal swab specimens and should not be used as a sole basis for treatment. Nasal washings and aspirates are unacceptable for Xpert Xpress SARS-CoV-2/FLU/RSV testing.  Fact Sheet for Patients: EntrepreneurPulse.com.au  Fact Sheet for Healthcare Providers: IncredibleEmployment.be  This test is not yet approved or cleared by the [place] FDA and has been authorized for detection and/or diagnosis of SARS-CoV-2 by FDA under an Emergency Use Authorization (EUA). This EUA will remain in effect (meaning this test can be used) for the duration of the COVID-19 declaration under Section 564(b)(1) of the Act, 21 U.S.C. section 360bbb-3(b)(1), unless the authorization is terminated or revoked.  Performed at [facility], [address] [place], [place] [address]   Blood Culture (routine x 2)     Status: None   Collection Time: 04/03/21  7:13 PM   Specimen: BLOOD  Result Value Ref Range Status   Specimen Description BLOOD BLOOD RIGHT FOREARM  Final   Special Requests   Final    BOTTLES DRAWN AEROBIC AND ANAEROBIC Blood Culture results may not be optimal due to an excessive volume of blood received in culture bottles   Culture   Final    NO GROWTH 5 DAYS Performed at [facility], [address] [place], [place] [address]    Report Status 04/08/2021 FINAL  Final  Blood Culture (routine x 2)     Status: None   Collection Time: 04/03/21  7:18 PM   Specimen: BLOOD  Result Value Ref Range Status   Specimen Description BLOOD LEFT ANTECUBITAL  Final   Special Requests   Final    BOTTLES DRAWN AEROBIC AND ANAEROBIC Blood Culture results may not be optimal due to an excessive volume of blood received in culture bottles   Culture   Final    NO GROWTH 5  DAYS Performed at [facility],  [place],  [address]    Report Status 04/08/2021 FINAL  Final    Labs: CBC: Recent Labs  Lab 04/03/21 1444 04/04/21 0732  WBC 8.1 7.2  NEUTROABS  --  4.6  HGB 15.9* 13.3  HCT 48.6* 42.3  MCV 86.5 89.6  PLT 308 561   Basic Metabolic Panel: Recent Labs  Lab 04/03/21 1444 04/03/21 1519 04/04/21 0732 04/05/21 0516 04/06/21 0606  NA 136  --  136 137 143  K 3.0*  --  3.4* 3.4* 3.8  CL 94*  --  95* 97* 98  CO2 30  --  31 31 30   GLUCOSE 106*  --  105* 96 90  BUN 13  --  9 8 9   CREATININE 0.77  --  0.76 0.61 0.69  CALCIUM 8.9  --  8.5* 8.4* 8.7*  MG  --  1.8 2.3 2.4  --   PHOS  --   --  4.4  --   --    Liver Function Tests: Recent Labs  Lab 04/03/21 1444 04/04/21 0732  AST 26 21  ALT 38 29  ALKPHOS 37* 33*  BILITOT 0.9 0.5  PROT 7.9 6.8  ALBUMIN 3.5 3.0*   CBG: No results for input(s): GLUCAP in the last 168 hours.  Discharge time spent: less than 30 minutes.  Signed: Ezekiel Slocumb, DO [facility] 04/09/2021

## 2021-04-07 LAB — LEGIONELLA PNEUMOPHILA SEROGP 1 UR AG: L. pneumophila Serogp 1 Ur Ag: NEGATIVE

## 2021-04-08 DIAGNOSIS — M1711 Unilateral primary osteoarthritis, right knee: Secondary | ICD-10-CM | POA: Diagnosis not present

## 2021-04-08 LAB — CULTURE, BLOOD (ROUTINE X 2)
Culture: NO GROWTH
Culture: NO GROWTH

## 2021-04-13 DIAGNOSIS — I471 Supraventricular tachycardia: Secondary | ICD-10-CM | POA: Diagnosis not present

## 2021-04-13 DIAGNOSIS — J189 Pneumonia, unspecified organism: Secondary | ICD-10-CM | POA: Diagnosis not present

## 2021-04-13 DIAGNOSIS — N390 Urinary tract infection, site not specified: Secondary | ICD-10-CM | POA: Diagnosis not present

## 2021-04-14 ENCOUNTER — Other Ambulatory Visit: Payer: Self-pay | Admitting: Internal Medicine

## 2021-04-14 DIAGNOSIS — J189 Pneumonia, unspecified organism: Secondary | ICD-10-CM

## 2021-04-15 DIAGNOSIS — M1711 Unilateral primary osteoarthritis, right knee: Secondary | ICD-10-CM | POA: Diagnosis not present

## 2021-05-14 ENCOUNTER — Ambulatory Visit
Admission: RE | Admit: 2021-05-14 | Discharge: 2021-05-14 | Disposition: A | Discharge status: Home / Self Care | Payer: Medicare HMO | Source: Ambulatory Visit | Attending: Internal Medicine | Admitting: Internal Medicine

## 2021-05-14 DIAGNOSIS — R918 Other nonspecific abnormal finding of lung field: Secondary | ICD-10-CM | POA: Diagnosis not present

## 2021-05-14 DIAGNOSIS — J439 Emphysema, unspecified: Secondary | ICD-10-CM | POA: Diagnosis not present

## 2021-05-14 DIAGNOSIS — J189 Pneumonia, unspecified organism: Secondary | ICD-10-CM | POA: Insufficient documentation

## 2021-05-22 DIAGNOSIS — R519 Headache, unspecified: Secondary | ICD-10-CM | POA: Diagnosis not present

## 2021-05-29 ENCOUNTER — Other Ambulatory Visit: Payer: Self-pay | Admitting: Specialist

## 2021-05-29 ENCOUNTER — Other Ambulatory Visit (HOSPITAL_COMMUNITY): Payer: Self-pay | Admitting: Specialist

## 2021-05-29 DIAGNOSIS — R918 Other nonspecific abnormal finding of lung field: Secondary | ICD-10-CM | POA: Diagnosis not present

## 2021-05-29 DIAGNOSIS — J449 Chronic obstructive pulmonary disease, unspecified: Secondary | ICD-10-CM | POA: Diagnosis not present

## 2021-05-29 DIAGNOSIS — F17218 Nicotine dependence, cigarettes, with other nicotine-induced disorders: Secondary | ICD-10-CM | POA: Diagnosis not present

## 2021-05-29 DIAGNOSIS — R0902 Hypoxemia: Secondary | ICD-10-CM | POA: Diagnosis not present

## 2021-06-02 DIAGNOSIS — G250 Essential tremor: Secondary | ICD-10-CM | POA: Diagnosis not present

## 2021-06-02 DIAGNOSIS — G4733 Obstructive sleep apnea (adult) (pediatric): Secondary | ICD-10-CM | POA: Diagnosis not present

## 2021-06-02 DIAGNOSIS — G5711 Meralgia paresthetica, right lower limb: Secondary | ICD-10-CM | POA: Diagnosis not present

## 2021-06-02 DIAGNOSIS — R413 Other amnesia: Secondary | ICD-10-CM | POA: Diagnosis not present

## 2021-06-02 DIAGNOSIS — R2689 Other abnormalities of gait and mobility: Secondary | ICD-10-CM | POA: Diagnosis not present

## 2021-06-02 DIAGNOSIS — Z9989 Dependence on other enabling machines and devices: Secondary | ICD-10-CM | POA: Diagnosis not present

## 2021-06-04 DIAGNOSIS — M6281 Muscle weakness (generalized): Secondary | ICD-10-CM | POA: Diagnosis not present

## 2021-06-08 DIAGNOSIS — J329 Chronic sinusitis, unspecified: Secondary | ICD-10-CM

## 2021-06-08 HISTORY — DX: Chronic sinusitis, unspecified: J32.9

## 2021-06-10 DIAGNOSIS — R0609 Other forms of dyspnea: Secondary | ICD-10-CM | POA: Diagnosis not present

## 2021-06-10 DIAGNOSIS — R0902 Hypoxemia: Secondary | ICD-10-CM | POA: Diagnosis not present

## 2021-06-10 DIAGNOSIS — M6281 Muscle weakness (generalized): Secondary | ICD-10-CM | POA: Diagnosis not present

## 2021-06-10 DIAGNOSIS — G4733 Obstructive sleep apnea (adult) (pediatric): Secondary | ICD-10-CM | POA: Diagnosis not present

## 2021-06-10 DIAGNOSIS — J449 Chronic obstructive pulmonary disease, unspecified: Secondary | ICD-10-CM | POA: Diagnosis not present

## 2021-06-11 ENCOUNTER — Encounter (HOSPITAL_COMMUNITY)
Admission: RE | Admit: 2021-06-11 | Discharge: 2021-06-11 | Disposition: A | Discharge status: Home / Self Care | Payer: Medicare HMO | Source: Ambulatory Visit | Attending: Specialist | Admitting: Specialist

## 2021-06-11 DIAGNOSIS — R918 Other nonspecific abnormal finding of lung field: Secondary | ICD-10-CM | POA: Diagnosis not present

## 2021-06-11 DIAGNOSIS — K3189 Other diseases of stomach and duodenum: Secondary | ICD-10-CM | POA: Diagnosis not present

## 2021-06-11 DIAGNOSIS — S2241XA Multiple fractures of ribs, right side, initial encounter for closed fracture: Secondary | ICD-10-CM | POA: Diagnosis not present

## 2021-06-11 DIAGNOSIS — R911 Solitary pulmonary nodule: Secondary | ICD-10-CM | POA: Diagnosis not present

## 2021-06-11 DIAGNOSIS — K8020 Calculus of gallbladder without cholecystitis without obstruction: Secondary | ICD-10-CM | POA: Diagnosis not present

## 2021-06-11 DIAGNOSIS — J432 Centrilobular emphysema: Secondary | ICD-10-CM | POA: Diagnosis not present

## 2021-06-11 LAB — GLUCOSE, CAPILLARY: Glucose-Capillary: 95 mg/dL (ref 70–99)

## 2021-06-11 MED ORDER — FLUDEOXYGLUCOSE F - 18 (FDG) INJECTION
10.1200 | Freq: Once | INTRAVENOUS | Status: AC | PRN
  Administered 2021-06-11: 10.12 via INTRAVENOUS

## 2021-06-12 DIAGNOSIS — M6281 Muscle weakness (generalized): Secondary | ICD-10-CM | POA: Diagnosis not present

## 2021-06-18 DIAGNOSIS — G4733 Obstructive sleep apnea (adult) (pediatric): Secondary | ICD-10-CM | POA: Diagnosis not present

## 2021-06-18 DIAGNOSIS — R0609 Other forms of dyspnea: Secondary | ICD-10-CM | POA: Diagnosis not present

## 2021-06-18 DIAGNOSIS — J449 Chronic obstructive pulmonary disease, unspecified: Secondary | ICD-10-CM | POA: Diagnosis not present

## 2021-06-18 DIAGNOSIS — R918 Other nonspecific abnormal finding of lung field: Secondary | ICD-10-CM | POA: Diagnosis not present

## 2021-06-19 DIAGNOSIS — M6281 Muscle weakness (generalized): Secondary | ICD-10-CM | POA: Diagnosis not present

## 2021-06-22 DIAGNOSIS — M6281 Muscle weakness (generalized): Secondary | ICD-10-CM | POA: Diagnosis not present

## 2021-06-24 DIAGNOSIS — M6281 Muscle weakness (generalized): Secondary | ICD-10-CM | POA: Diagnosis not present

## 2021-06-25 DIAGNOSIS — G4733 Obstructive sleep apnea (adult) (pediatric): Secondary | ICD-10-CM | POA: Diagnosis not present

## 2021-06-25 DIAGNOSIS — R053 Chronic cough: Secondary | ICD-10-CM | POA: Diagnosis not present

## 2021-06-25 DIAGNOSIS — J310 Chronic rhinitis: Secondary | ICD-10-CM | POA: Diagnosis not present

## 2021-06-25 DIAGNOSIS — R0609 Other forms of dyspnea: Secondary | ICD-10-CM | POA: Diagnosis not present

## 2021-06-25 DIAGNOSIS — R918 Other nonspecific abnormal finding of lung field: Secondary | ICD-10-CM | POA: Diagnosis not present

## 2021-06-25 DIAGNOSIS — J449 Chronic obstructive pulmonary disease, unspecified: Secondary | ICD-10-CM | POA: Diagnosis not present

## 2021-06-28 DIAGNOSIS — J449 Chronic obstructive pulmonary disease, unspecified: Secondary | ICD-10-CM | POA: Diagnosis not present

## 2021-06-30 DIAGNOSIS — M6281 Muscle weakness (generalized): Secondary | ICD-10-CM | POA: Diagnosis not present

## 2021-07-01 ENCOUNTER — Encounter
Admission: RE | Admit: 2021-07-01 | Discharge: 2021-07-01 | Disposition: A | Discharge status: Home / Self Care | Payer: Medicare HMO | Source: Ambulatory Visit | Attending: Pulmonary Disease | Admitting: Pulmonary Disease

## 2021-07-01 DIAGNOSIS — Z01818 Encounter for other preprocedural examination: Secondary | ICD-10-CM

## 2021-07-01 DIAGNOSIS — I471 Supraventricular tachycardia: Secondary | ICD-10-CM

## 2021-07-01 DIAGNOSIS — I5032 Chronic diastolic (congestive) heart failure: Secondary | ICD-10-CM

## 2021-07-01 HISTORY — DX: Cardiac murmur, unspecified: R01.1

## 2021-07-01 HISTORY — DX: Urinary tract infection, site not specified: N39.0

## 2021-07-01 HISTORY — DX: Sepsis, unspecified organism: A41.9

## 2021-07-01 HISTORY — DX: Unspecified atrial flutter: I48.92

## 2021-07-01 HISTORY — DX: Anemia, unspecified: D64.9

## 2021-07-01 HISTORY — DX: Family history of other specified conditions: Z84.89

## 2021-07-01 NOTE — Patient Instructions (Signed)
Your procedure is scheduled on:07-08-21 Wednesday Report to the Registration Desk on the 1st floor of the [facility].Then proceed to the 2nd floor Surgery Desk To find out your arrival time, please call [phone] between 1PM - 3PM on:07-07-21 Tuesday If your arrival time is 6:00 am, do not arrive prior to that time as the [facility] entrance doors do not open until 6:00 am.  REMEMBER: Instructions that are not followed completely may result in serious medical risk, up to and including death; or upon the discretion of your surgeon and anesthesiologist your surgery may need to be rescheduled.  Do not eat food after midnight the night before surgery.  No gum chewing, lozengers or hard candies.  You may however, drink CLEAR liquids up to 2 hours before you are scheduled to arrive for your surgery. Do not drink anything within 2 hours of your scheduled arrival time.  Clear liquids include: - water  - apple juice without pulp - gatorade (not RED colors) - black coffee or tea (Do NOT add milk or creamers to the coffee or tea) Do NOT drink anything that is not on this list.  TAKE THESE MEDICATIONS THE MORNING OF SURGERY WITH A SIP OF WATER: -busPIRone (BUSPAR)  -gabapentin (NEURONTIN) -montelukast (SINGULAIR)  -propranolol (INDERAL)  -pantoprazole (PROTONIX) -take one the night before and one on the morning of surgery - helps to prevent nausea after surgery.)  Use your albuterol (PROVENTIL HFA;VENTOLIN HFA) Inhaler the day of surgery and bring your Inhaler to the hospital  One week prior to surgery: Stop Anti-inflammatories (NSAIDS) such as Advil, Aleve, Ibuprofen, Motrin, Naproxen, Naprosyn and Aspirin based products such as Excedrin, Goodys Powder, BC Powder.You may however, continue to take Tylenol if needed for pain up until the day of surgery. Stop ANY OVER THE COUNTER supplements/vitamins NOW (07-01-21) until after surgery (Calcium, Vitamin D, Glucosamine-Chondroitin)   No  Alcohol for 24 hours before or after surgery.  No Smoking including e-cigarettes for 24 hours prior to surgery.  No chewable tobacco products for at least 6 hours prior to surgery.  No nicotine patches on the day of surgery.  Do not use any "recreational" drugs for at least a week prior to your surgery.  Please be advised that the combination of cocaine and anesthesia may have negative outcomes, up to and including death. If you test positive for cocaine, your surgery will be cancelled.  On the morning of surgery brush your teeth with toothpaste and water, you may rinse your mouth with mouthwash if you wish. Do not swallow any toothpaste or mouthwash.  Do not wear jewelry, make-up, hairpins, clips or nail polish.  Do not wear lotions, powders, or perfumes.   Do not shave body from the neck down 48 hours prior to surgery just in case you cut yourself which could leave a site for infection.  Also, freshly shaved skin may become irritated if using the CHG soap.  Contact lenses, hearing aids and dentures may not be worn into surgery.  Do not bring valuables to the hospital. [facility] is not responsible for any missing/lost belongings or valuables.  Bring your C-PAP to the hospital with you  Notify your doctor if there is any change in your medical condition (cold, fever, infection).  Wear comfortable clothing (specific to your surgery type) to the hospital.  After surgery, you can help prevent lung complications by doing breathing exercises.  Take deep breaths and cough every 1-2 hours. Your doctor may order a device called  an Chiropodist to help you take deep breaths. When coughing or sneezing, hold a pillow firmly against your incision with both hands. This is called "splinting." Doing this helps protect your incision. It also decreases belly discomfort.  If you are being admitted to the hospital overnight, leave your suitcase in the car. After surgery it may be brought to  your room.  If you are being discharged the day of surgery, you will not be allowed to drive home. You will need a responsible adult (18 years or older) to drive you home and stay with you that night.   If you are taking public transportation, you will need to have a responsible adult (18 years or older) with you. Please confirm with your physician that it is acceptable to use public transportation.   Please call the [facility] Dept. at [phone] if you have any questions about these instructions.  Surgery Visitation Policy:  Patients undergoing a surgery or procedure may have two family members or support persons with them as long as the person is not COVID-19 positive or experiencing its symptoms.

## 2021-07-02 ENCOUNTER — Encounter
Admission: RE | Admit: 2021-07-02 | Discharge: 2021-07-02 | Disposition: A | Discharge status: Home / Self Care | Payer: Medicare HMO | Source: Ambulatory Visit | Attending: Pulmonary Disease | Admitting: Pulmonary Disease

## 2021-07-02 DIAGNOSIS — Z01818 Encounter for other preprocedural examination: Secondary | ICD-10-CM

## 2021-07-02 DIAGNOSIS — I5032 Chronic diastolic (congestive) heart failure: Secondary | ICD-10-CM | POA: Diagnosis not present

## 2021-07-02 DIAGNOSIS — Z01810 Encounter for preprocedural cardiovascular examination: Secondary | ICD-10-CM | POA: Insufficient documentation

## 2021-07-02 DIAGNOSIS — I471 Supraventricular tachycardia: Secondary | ICD-10-CM | POA: Insufficient documentation

## 2021-07-03 DIAGNOSIS — R918 Other nonspecific abnormal finding of lung field: Secondary | ICD-10-CM | POA: Diagnosis not present

## 2021-07-03 DIAGNOSIS — Z01812 Encounter for preprocedural laboratory examination: Secondary | ICD-10-CM | POA: Diagnosis not present

## 2021-07-03 DIAGNOSIS — M6281 Muscle weakness (generalized): Secondary | ICD-10-CM | POA: Diagnosis not present

## 2021-07-07 ENCOUNTER — Encounter
Admission: RE | Admit: 2021-07-07 | Discharge: 2021-07-07 | Disposition: A | Discharge status: Home / Self Care | Payer: Medicare HMO | Source: Ambulatory Visit | Attending: Pulmonary Disease | Admitting: Pulmonary Disease

## 2021-07-07 ENCOUNTER — Other Ambulatory Visit: Payer: Self-pay | Admitting: Pulmonary Disease

## 2021-07-07 DIAGNOSIS — M6281 Muscle weakness (generalized): Secondary | ICD-10-CM | POA: Diagnosis not present

## 2021-07-07 DIAGNOSIS — Z01812 Encounter for preprocedural laboratory examination: Secondary | ICD-10-CM | POA: Insufficient documentation

## 2021-07-07 DIAGNOSIS — Z01818 Encounter for other preprocedural examination: Secondary | ICD-10-CM

## 2021-07-07 DIAGNOSIS — Z20822 Contact with and (suspected) exposure to covid-19: Secondary | ICD-10-CM | POA: Insufficient documentation

## 2021-07-07 DIAGNOSIS — R918 Other nonspecific abnormal finding of lung field: Secondary | ICD-10-CM

## 2021-07-08 ENCOUNTER — Ambulatory Visit: Payer: Medicare HMO | Admitting: Urgent Care

## 2021-07-08 ENCOUNTER — Ambulatory Visit: Payer: Medicare HMO

## 2021-07-08 ENCOUNTER — Ambulatory Visit
Admission: RE | Admit: 2021-07-08 | Discharge: 2021-07-08 | Disposition: A | Discharge status: Home / Self Care | Payer: Medicare HMO | Source: Ambulatory Visit | Attending: Pulmonary Disease | Admitting: Pulmonary Disease

## 2021-07-08 ENCOUNTER — Other Ambulatory Visit: Payer: Self-pay

## 2021-07-08 ENCOUNTER — Encounter
Admission: RE | Disposition: A | Discharge status: Home / Self Care | Payer: Self-pay | Source: Home / Self Care | Attending: Pulmonary Disease

## 2021-07-08 ENCOUNTER — Ambulatory Visit
Admission: RE | Admit: 2021-07-08 | Discharge: 2021-07-08 | Disposition: A | Discharge status: Home / Self Care | Payer: Medicare HMO | Attending: Pulmonary Disease | Admitting: Pulmonary Disease

## 2021-07-08 DIAGNOSIS — Z86718 Personal history of other venous thrombosis and embolism: Secondary | ICD-10-CM | POA: Insufficient documentation

## 2021-07-08 DIAGNOSIS — J432 Centrilobular emphysema: Secondary | ICD-10-CM | POA: Diagnosis not present

## 2021-07-08 DIAGNOSIS — G629 Polyneuropathy, unspecified: Secondary | ICD-10-CM | POA: Diagnosis not present

## 2021-07-08 DIAGNOSIS — R918 Other nonspecific abnormal finding of lung field: Secondary | ICD-10-CM | POA: Diagnosis not present

## 2021-07-08 DIAGNOSIS — Z9981 Dependence on supplemental oxygen: Secondary | ICD-10-CM | POA: Insufficient documentation

## 2021-07-08 DIAGNOSIS — J439 Emphysema, unspecified: Secondary | ICD-10-CM | POA: Diagnosis not present

## 2021-07-08 DIAGNOSIS — K219 Gastro-esophageal reflux disease without esophagitis: Secondary | ICD-10-CM | POA: Insufficient documentation

## 2021-07-08 DIAGNOSIS — C3411 Malignant neoplasm of upper lobe, right bronchus or lung: Secondary | ICD-10-CM | POA: Diagnosis not present

## 2021-07-08 DIAGNOSIS — Z8719 Personal history of other diseases of the digestive system: Secondary | ICD-10-CM | POA: Insufficient documentation

## 2021-07-08 DIAGNOSIS — I509 Heart failure, unspecified: Secondary | ICD-10-CM | POA: Diagnosis not present

## 2021-07-08 DIAGNOSIS — R590 Localized enlarged lymph nodes: Secondary | ICD-10-CM | POA: Diagnosis not present

## 2021-07-08 DIAGNOSIS — R911 Solitary pulmonary nodule: Secondary | ICD-10-CM | POA: Diagnosis not present

## 2021-07-08 DIAGNOSIS — Z87891 Personal history of nicotine dependence: Secondary | ICD-10-CM | POA: Insufficient documentation

## 2021-07-08 DIAGNOSIS — Z85820 Personal history of malignant melanoma of skin: Secondary | ICD-10-CM | POA: Diagnosis not present

## 2021-07-08 DIAGNOSIS — G4730 Sleep apnea, unspecified: Secondary | ICD-10-CM | POA: Diagnosis not present

## 2021-07-08 DIAGNOSIS — I110 Hypertensive heart disease with heart failure: Secondary | ICD-10-CM | POA: Insufficient documentation

## 2021-07-08 HISTORY — PX: VIDEO BRONCHOSCOPY WITH ENDOBRONCHIAL NAVIGATION: SHX6175

## 2021-07-08 HISTORY — PX: VIDEO BRONCHOSCOPY WITH ENDOBRONCHIAL ULTRASOUND: SHX6177

## 2021-07-08 LAB — SARS CORONAVIRUS 2 (TAT 6-24 HRS): SARS Coronavirus 2: NEGATIVE

## 2021-07-08 SURGERY — BRONCHOSCOPY, WITH EBUS
Anesthesia: General

## 2021-07-08 MED ORDER — FENTANYL CITRATE (PF) 100 MCG/2ML IJ SOLN: 25.0000 ug | INTRAMUSCULAR | Status: DC | PRN

## 2021-07-08 MED ORDER — PHENYLEPHRINE 80 MCG/ML (10ML) SYRINGE FOR IV PUSH (FOR BLOOD PRESSURE SUPPORT)
PREFILLED_SYRINGE | INTRAVENOUS | Status: AC
  Filled 2021-07-08: qty 10

## 2021-07-08 MED ORDER — OXYCODONE HCL 5 MG/5ML PO SOLN: 5.0000 mg | Freq: Once | ORAL | Status: DC | PRN

## 2021-07-08 MED ORDER — LIDOCAINE HCL URETHRAL/MUCOSAL 2 % EX GEL
1.0000 "application " | Freq: Once | CUTANEOUS | Status: DC
  Filled 2021-07-08: qty 5

## 2021-07-08 MED ORDER — ORAL CARE MOUTH RINSE: 15.0000 mL | Freq: Once | OROMUCOSAL | Status: AC

## 2021-07-08 MED ORDER — PROPOFOL 10 MG/ML IV BOLUS
INTRAVENOUS | Status: AC
  Filled 2021-07-08: qty 20

## 2021-07-08 MED ORDER — SUGAMMADEX SODIUM 200 MG/2ML IV SOLN
INTRAVENOUS | Status: DC | PRN
  Administered 2021-07-08: 250 mg via INTRAVENOUS

## 2021-07-08 MED ORDER — BUTAMBEN-TETRACAINE-BENZOCAINE 2-2-14 % EX AERO
1.0000 | INHALATION_SPRAY | Freq: Once | CUTANEOUS | Status: DC
  Filled 2021-07-08: qty 20

## 2021-07-08 MED ORDER — IPRATROPIUM-ALBUTEROL 0.5-2.5 (3) MG/3ML IN SOLN
RESPIRATORY_TRACT | Status: AC
  Administered 2021-07-08: 3 mL via RESPIRATORY_TRACT
  Filled 2021-07-08: qty 3

## 2021-07-08 MED ORDER — EPHEDRINE SULFATE (PRESSORS) 50 MG/ML IJ SOLN
INTRAMUSCULAR | Status: DC | PRN
  Administered 2021-07-08: 10 mg via INTRAVENOUS

## 2021-07-08 MED ORDER — FENTANYL CITRATE (PF) 100 MCG/2ML IJ SOLN
INTRAMUSCULAR | Status: AC
  Filled 2021-07-08: qty 2

## 2021-07-08 MED ORDER — CHLORHEXIDINE GLUCONATE 0.12 % MT SOLN
OROMUCOSAL | Status: AC
  Administered 2021-07-08: 15 mL via OROMUCOSAL
  Filled 2021-07-08: qty 15

## 2021-07-08 MED ORDER — IPRATROPIUM-ALBUTEROL 0.5-2.5 (3) MG/3ML IN SOLN: 3.0000 mL | Freq: Once | RESPIRATORY_TRACT | Status: AC

## 2021-07-08 MED ORDER — ROCURONIUM BROMIDE 10 MG/ML (PF) SYRINGE
PREFILLED_SYRINGE | INTRAVENOUS | Status: AC
  Filled 2021-07-08: qty 10

## 2021-07-08 MED ORDER — PHENYLEPHRINE HCL (PRESSORS) 10 MG/ML IV SOLN
INTRAVENOUS | Status: DC | PRN
  Administered 2021-07-08 (×4): 80 ug via INTRAVENOUS

## 2021-07-08 MED ORDER — EPHEDRINE 5 MG/ML INJ
INTRAVENOUS | Status: AC
  Filled 2021-07-08: qty 5

## 2021-07-08 MED ORDER — LIDOCAINE HCL (CARDIAC) PF 100 MG/5ML IV SOSY
PREFILLED_SYRINGE | INTRAVENOUS | Status: DC | PRN
  Administered 2021-07-08: 100 mg via INTRAVENOUS

## 2021-07-08 MED ORDER — SEVOFLURANE IN SOLN
RESPIRATORY_TRACT | Status: AC
  Filled 2021-07-08: qty 250

## 2021-07-08 MED ORDER — DEXAMETHASONE SODIUM PHOSPHATE 10 MG/ML IJ SOLN
INTRAMUSCULAR | Status: AC
  Filled 2021-07-08: qty 1

## 2021-07-08 MED ORDER — ONDANSETRON HCL 4 MG/2ML IJ SOLN
INTRAMUSCULAR | Status: DC | PRN
  Administered 2021-07-08: 4 mg via INTRAVENOUS

## 2021-07-08 MED ORDER — ROCURONIUM BROMIDE 100 MG/10ML IV SOLN
INTRAVENOUS | Status: DC | PRN
  Administered 2021-07-08: 50 mg via INTRAVENOUS

## 2021-07-08 MED ORDER — LIDOCAINE HCL (PF) 1 % IJ SOLN
30.0000 mL | Freq: Once | INTRAMUSCULAR | Status: DC
  Filled 2021-07-08: qty 30

## 2021-07-08 MED ORDER — PROPOFOL 10 MG/ML IV BOLUS
INTRAVENOUS | Status: DC | PRN
  Administered 2021-07-08: 100 mg via INTRAVENOUS

## 2021-07-08 MED ORDER — LIDOCAINE HCL (PF) 2 % IJ SOLN
INTRAMUSCULAR | Status: AC
  Filled 2021-07-08: qty 5

## 2021-07-08 MED ORDER — DEXAMETHASONE SODIUM PHOSPHATE 10 MG/ML IJ SOLN
INTRAMUSCULAR | Status: DC | PRN
  Administered 2021-07-08: 10 mg via INTRAVENOUS

## 2021-07-08 MED ORDER — OXYCODONE HCL 5 MG PO TABS: 5.0000 mg | ORAL_TABLET | Freq: Once | ORAL | Status: DC | PRN

## 2021-07-08 MED ORDER — CHLORHEXIDINE GLUCONATE 0.12 % MT SOLN: 15.0000 mL | Freq: Once | OROMUCOSAL | Status: AC

## 2021-07-08 MED ORDER — FENTANYL CITRATE (PF) 100 MCG/2ML IJ SOLN
INTRAMUSCULAR | Status: DC | PRN
Start: 2021-07-08 — End: 2021-07-08
  Administered 2021-07-08: 25 ug via INTRAVENOUS
  Administered 2021-07-08: 50 ug via INTRAVENOUS
  Administered 2021-07-08: 25 ug via INTRAVENOUS

## 2021-07-08 MED ORDER — ACETAMINOPHEN 10 MG/ML IV SOLN: 1000.0000 mg | Freq: Once | INTRAVENOUS | Status: DC | PRN

## 2021-07-08 MED ORDER — ONDANSETRON HCL 4 MG/2ML IJ SOLN
4.0000 mg | Freq: Once | INTRAMUSCULAR | Status: DC | PRN
Start: 2021-07-08 — End: 2021-07-08

## 2021-07-08 MED ORDER — ONDANSETRON HCL 4 MG/2ML IJ SOLN
INTRAMUSCULAR | Status: AC
  Filled 2021-07-08: qty 2

## 2021-07-08 MED ORDER — PHENYLEPHRINE HCL 0.25 % NA SOLN
1.0000 | Freq: Four times a day (QID) | NASAL | Status: DC | PRN
  Filled 2021-07-08: qty 15

## 2021-07-08 MED ORDER — LACTATED RINGERS IV SOLN: INTRAVENOUS | Status: DC

## 2021-07-08 NOTE — Progress Notes (Signed)
Dr. Lanney Gins in to see patient in post op to update herself and husband. Patient is doing very well.

## 2021-07-08 NOTE — Anesthesia Preprocedure Evaluation (Addendum)
Anesthesia Evaluation  Patient identified by MRN, date of birth, ID band Patient awake    Reviewed: Allergy & Precautions, NPO status , Patient's Chart, lab work & pertinent test results  History of Anesthesia Complications Negative for: history of anesthetic complications  Airway Mallampati: II  TM Distance: >3 FB Neck ROM: Full    Dental no notable dental hx. (+) Teeth Intact   Pulmonary sleep apnea and Continuous Positive Airway Pressure Ventilation , COPD,  COPD inhaler and oxygen dependent, Patient abstained from smoking.Not current smoker, former smoker,     + decreased breath sounds      Cardiovascular Exercise Tolerance: Poor METS(-) hypertension+CHF and + DOE  (-) CAD and (-) Past MI (-) dysrhythmias  Rhythm:Regular Rate:Normal - Systolic murmurs 1. Left ventricular ejection fraction, by estimation, is 50 to 55%. The  left ventricle has low normal function. The left ventricle has no regional  wall motion abnormalities. The left ventricular internal cavity size was  mildly dilated. Left ventricular  diastolic parameters are consistent with Grade II diastolic dysfunction  (pseudonormalization).  2. Right ventricular systolic function is normal. The right ventricular  size is normal.  3. The mitral valve is normal in structure. No evidence of mitral valve  regurgitation.  4. The aortic valve is normal in structure. Aortic valve regurgitation is  not visualized.    Neuro/Psych PSYCHIATRIC DISORDERS Depression negative neurological ROS     GI/Hepatic GERD  ,(+)     (-) substance abuse  , Hepatitis -  Endo/Other  neg diabetes  Renal/GU negative Renal ROS     Musculoskeletal   Abdominal   Peds  Hematology   Anesthesia Other Findings Past Medical History: No date: Adopted No date: Anemia     Comment:  during pregnancy only No date: Arthritis     Comment:  osteoarthritis No date: Atrial flutter  ([place]) 1975: Blood clot in vein No date: Cancer ([place]) No date: Complication of anesthesia     Comment:  body aches after anesthesia No date: Depression No date: Diverticulosis No date: Family history of adverse reaction to anesthesia     Comment:  pt is adopted and is unsure No date: GERD (gastroesophageal reflux disease) No date: Heart murmur No date: Hepatitis     Comment:  as a child No date: History of bronchitis No date: Hypercholesterolemia No date: Motion sickness     Comment:  curvy/hilly roads No date: Neuropathy     Comment:  upper legs No date: Osteoporosis 03/2021: Pneumonia No date: Sepsis ([place]) 06/2021: Sinus infection No date: Skin cancer (melanoma) ([place])     Comment:  left upper thigh No date: Skin cancer of face     Comment:  basal cell No date: Sleep apnea     Comment:  CPAP No date: Tremor No date: UTI (urinary tract infection) No date: Wears dentures     Comment:  full upper and lower  Reproductive/Obstetrics                            Anesthesia Physical Anesthesia Plan  ASA: 3  Anesthesia Plan: General   Post-op Pain Management: Minimal or no pain anticipated   Induction: Intravenous  PONV Risk Score and Plan: 3 and Ondansetron, Dexamethasone and Treatment may vary due to age or medical condition  Airway Management Planned: Oral ETT  Additional Equipment: None  Intra-op Plan:   Post-operative Plan: Extubation in OR  Informed Consent: I have reviewed  the patients History and Physical, chart, labs and discussed the procedure including the risks, benefits and alternatives for the proposed anesthesia with the patient or authorized representative who has indicated his/her understanding and acceptance.     Dental advisory given  Plan Discussed with: CRNA and Surgeon  Anesthesia Plan Comments: (Discussed risks of anesthesia with patient, including PONV, sore throat, lip/dental/eye damage. Rare risks discussed as  well, such as cardiorespiratory and neurological sequelae, and allergic reactions. Discussed the role of CRNA in patient's perioperative care. Patient understands.)        Anesthesia Quick Evaluation

## 2021-07-08 NOTE — Discharge Instructions (Signed)
AMBULATORY SURGERY  ?DISCHARGE INSTRUCTIONS ? ? ?The drugs that you were given will stay in your system until tomorrow so for the next 24 hours you should not: ? ?Drive an automobile ?Make any legal decisions ?Drink any alcoholic beverage ? ? ?You may resume regular meals tomorrow.  Today it is better to start with liquids and gradually work up to solid foods. ? ?You may eat anything you prefer, but it is better to start with liquids, then soup and crackers, and gradually work up to solid foods. ? ? ?Please notify your doctor immediately if you have any unusual bleeding, trouble breathing, redness and pain at the surgery site, drainage, fever, or pain not relieved by medication. ? ? ? ?Additional Instructions: ? ? ? ?Please contact your physician with any problems or Same Day Surgery at [phone], Monday through Friday 6 am to 4 pm, or [facility] Main number at [phone].  ?

## 2021-07-08 NOTE — H&P (Signed)
PULMONOLOGY         Date: 07/08/2021,   MRN# [id number] Annette Quinn December 24, 1950     Admission                  Current   Referring provider: Dr. Raul Del   CHIEF COMPLAINT:   Right lung mass with hilar and mediastinal lymphadenopathy   HISTORY OF PRESENT ILLNESS   This is a pleasant patient with a history of chronic anemia, osteoarthritis, atrial flutter, DVTs, major depression, diverticulosis, GERD, hepatitis, bronchitis, dyslipidemia, neuropathy, and osteoporosis who was seen in pulmonology clinic with Dr. Raul Del with findings of possible lung mass on the right.  This was followed up with PET scan from [place], with findings of hypermetabolic lesion of the right upper lobe consistent with primary bronchogenic carcinoma.  There is also a pleural-based left upper lobe subcentimeter small focus with subtle activity.  Today she is here for lung biopsy via navigational bronchoscopy as well as lymph node evaluation with endobronchial ultrasound and potential lymph node biopsies for staging.  We reviewed complications and benefits of procedure patient wishes to proceed as planned.   PAST MEDICAL HISTORY   Past Medical History:  Diagnosis Date   Adopted    Anemia    during pregnancy only   Arthritis    osteoarthritis   Atrial flutter ([place])    Blood clot in vein 1975   Cancer ([place])    Complication of anesthesia    body aches after anesthesia   Depression    Diverticulosis    Family history of adverse reaction to anesthesia    pt is adopted and is unsure   GERD (gastroesophageal reflux disease)    Heart murmur    Hepatitis    as a child   History of bronchitis    Hypercholesterolemia    Motion sickness    curvy/hilly roads   Neuropathy    upper legs   Osteoporosis    Pneumonia 03/2021   Sepsis ([place])    Sinus infection 06/2021   Skin cancer (melanoma) ([place])    left upper thigh   Skin cancer of face    basal cell   Sleep apnea    CPAP   Tremor    UTI  (urinary tract infection)    Wears dentures    full upper and lower     SURGICAL HISTORY   Past Surgical History:  Procedure Laterality Date   ABDOMINAL HYSTERECTOMY  1979   BLADDER SUSPENSION  1992   BREAST BIOPSY Left 2010   benign   CATARACT EXTRACTION W/PHACO Right 12/08/2016   Procedure: CATARACT EXTRACTION PHACO AND INTRAOCULAR LENS PLACEMENT (IOC)-RIGHT;  Surgeon: Birder Robson, MD;  Location: [facility] OR[facility];  Service: Ophthalmology;  Laterality: Right;  [place] 00:56.6 AP% 11.5 CDE 6.52 Fluid Pack lot # [id number]   CATARACT EXTRACTION W/PHACO Left 01/04/2017   Procedure: CATARACT EXTRACTION PHACO AND INTRAOCULAR LENS PLACEMENT (IOC);  Surgeon: Birder Robson, MD;  Location: [facility];  Service: Ophthalmology;  Laterality: Left;  [place] 00:33.9 AP% 14.5 CDE 4.91 Fluid Pack lot # [id number]   COLONOSCOPY  2010, 2015   COLONOSCOPY WITH PROPOFOL N/A 05/02/2019   Procedure: COLONOSCOPY WITH PROPOFOL;  Surgeon: Toledo, Benay Pike, MD;  Location: [facility];  Service: Gastroenterology;  Laterality: N/A;   DILATION AND CURETTAGE OF UTERUS  1979   DISTAL INTERPHALANGEAL JOINT FUSION Left 08/26/2016   Procedure: DISTAL INTERPHALANGEAL JOINT FUSION-LEFT INDEX FINGER;  Surgeon: Hessie Knows, MD;  Location:  [facility];  Service: Orthopedics;  Laterality: Left;   FRACTURE SURGERY     left arm   HAMMER TOE SURGERY Left 1989   HAMMER TOE SURGERY Right 03/31/2016   Procedure: HAMMER TOE CORRECTION RIGHT T6 T7;  Surgeon: Samara Deist, DPM;  Location: [facility];  Service: Podiatry;  Laterality: Right;  sleep apnea   KNEE ARTHROSCOPY W/ PARTIAL MEDIAL MENISCECTOMY Right 03/05/2014   medial and lateral.  Abrasion hondroplasty of medial femoral condyle and femorla trochlea.   NASAL SEPTUM SURGERY     REVERSE SHOULDER ARTHROPLASTY Left 01/20/2017   Procedure: REVERSE SHOULDER ARTHROPLASTY;  Surgeon: Corky Mull, MD;  Location: [facility];  Service: Orthopedics;  Laterality: Left;    TONSILLECTOMY  1973   TRIGGER FINGER RELEASE Left    thumb   VEIN LIGATION     WEIL OSTEOTOMY Right 03/31/2016   Procedure: WEIL OSTEOTOMY X2 RIGHT FOOT;  Surgeon: Samara Deist, DPM;  Location: [facility];  Service: Podiatry;  Laterality: Right;  IV WITH POPLITEAL     FAMILY HISTORY   Family History  Adopted: Yes  Problem Relation Age of Onset   Breast cancer Neg Hx      SOCIAL HISTORY   Social History   Tobacco Use   Smoking status: Former    Packs/day: 1.00    Years: 43.00    Pack years: 43.00    Types: Cigarettes    Quit date: 07/20/2010    Years since quitting: 10.9   Smokeless tobacco: Never  Vaping Use   Vaping Use: Some days   Substances: Flavoring  Substance Use Topics   Alcohol use: No   Drug use: No     MEDICATIONS    Home Medication:    Current Medication:  Current Facility-Administered Medications:    chlorhexidine (PERIDEX) 0.12 % solution 15 mL, 15 mL, Mouth/Throat, Once **OR** MEDLINE mouth rinse, 15 mL, Mouth Rinse, Once, Martha Clan, MD   lactated ringers infusion, , Intravenous, Continuous, Martha Clan, MD    ALLERGIES   Actonel [risedronate sodium], Boniva [ibandronic acid], Fosamax [alendronate], Lopid [gemfibrozil], Penicillins, Reclast [zoledronic acid], Strawberry (diagnostic), Tape, and Doxycycline     REVIEW OF SYSTEMS    Review of Systems:  Gen:  Denies  fever, sweats, chills weigh loss  HEENT: Denies blurred vision, double vision, ear pain, eye pain, hearing loss, nose bleeds, sore throat Cardiac:  No dizziness, chest pain or heaviness, chest tightness,edema Resp:   reports dyspnea chronically  Gi: Denies swallowing difficulty, stomach pain, nausea or vomiting, diarrhea, constipation, bowel incontinence Gu:  Denies bladder incontinence, burning urine Ext:   Denies Joint pain, stiffness or swelling Skin: Denies  skin rash, easy bruising or bleeding or hives Endoc:  Denies polyuria, polydipsia ,  polyphagia or weight change Psych:   Denies depression, insomnia or hallucinations   Other:  All other systems negative   VS: There were no vitals taken for this visit.     PHYSICAL EXAM    GENERAL:NAD, no fevers, chills, no weakness no fatigue HEAD: Normocephalic, atraumatic.  EYES: Pupils equal, round, reactive to light. Extraocular muscles intact. No scleral icterus.  MOUTH: Moist mucosal membrane. Dentition intact. No abscess noted.  EAR, NOSE, THROAT: Clear without exudates. No external lesions.  NECK: Supple. No thyromegaly. No nodules. No JVD.  PULMONARY: decreased breath sounds with mild rhonchi worse at bases bilaterally.  CARDIOVASCULAR: S1 and S2. Regular rate and rhythm. No murmurs, rubs, or gallops. No edema. Pedal pulses 2+ bilaterally.  GASTROINTESTINAL: Soft, nontender, nondistended. No masses. Positive bowel sounds. No hepatosplenomegaly.  MUSCULOSKELETAL: No swelling, clubbing, or edema. Range of motion full in all extremities.  NEUROLOGIC: Cranial nerves II through XII are intact. No gross focal neurological deficits. Sensation intact. Reflexes intact.  SKIN: No ulceration, lesions, rashes, or cyanosis. Skin warm and dry. Turgor intact.  PSYCHIATRIC: Mood, affect within normal limits. The patient is awake, alert and oriented x 3. Insight, judgment intact.       IMAGING   @IMAGES @   ASSESSMENT/PLAN   Right upper lobe lung nodule Previously imaged with PET/CT with hypermetabolic focus suggestive of primary bronchogenic carcinoma -Plan to perform navigational bronchoscopy as well as airway inspection with therapeutic aspiration of mucous plugging and endobronchial ultrasound -Patient wishes to proceed after full discourse regarding details of procedure and risks and benefits.   -Reviewed risks/complications and benefits with patient, risks include infection, pneumothorax/pneumomediastinum which may require chest tube placement as well as overnight/prolonged  hospitalization and possible mechanical ventilation. Other risks include bleeding and very rarely death.  Patient understands risks and wishes to proceed.  Additional questions were answered, and patient is aware that post procedure patient will be going home with family and may experience cough with possible clots on expectoration as well as phlegm which may last few days as well as hoarseness of voice post intubation and mechanical ventilation.    Centrilobular emphysema and COPD -Patient brought in her albuterol inhaler -No recent acute exacerbation episodes       Thank you for allowing me to participate in the care of this patient.   Patient/Family are satisfied with care plan and all questions have been answered.    Provider disclosure: Patient with at least one acute or chronic illness or injury that poses a threat to life or bodily function and is being managed actively during this encounter.  All of the below services have been performed independently by signing provider:  review of prior documentation from internal and or external health records.  Review of previous and current lab results.  Interview and comprehensive assessment during patient visit today. Review of current and previous chest radiographs/CT scans. Discussion of management and test interpretation with health care team and patient/family.   This document was prepared using Dragon voice recognition software and may include unintentional dictation errors.     Ottie Glazier, M.D.  Division of Pulmonary & Critical Care Medicine

## 2021-07-08 NOTE — Anesthesia Procedure Notes (Signed)
Procedure Name: Intubation Date/Time: 07/08/2021 1:12 PM Performed by: Cammie Sickle, CRNA Pre-anesthesia Checklist: Patient identified, Patient being monitored, Timeout performed, Emergency Drugs available and Suction available Patient Re-evaluated:Patient Re-evaluated prior to induction Oxygen Delivery Method: Circle system utilized Preoxygenation: Pre-oxygenation with 100% oxygen Induction Type: IV induction Ventilation: Mask ventilation without difficulty Laryngoscope Size: 3 and McGraph Grade View: Grade I Tube type: Oral Tube size: 9.0 mm Number of attempts: 1 Airway Equipment and Method: Stylet Placement Confirmation: ETT inserted through vocal cords under direct vision, positive ETCO2 and breath sounds checked- equal and bilateral Secured at: 21 cm Tube secured with: Tape Dental Injury: Teeth and Oropharynx as per pre-operative assessment  Comments: 9.0 ETT tube requested by pulmonologist. ETT Placed without complications.

## 2021-07-08 NOTE — Transfer of Care (Signed)
Immediate Anesthesia Transfer of Care Note  Patient: Annette Quinn  Procedure(s) Performed: VIDEO BRONCHOSCOPY WITH ENDOBRONCHIAL ULTRASOUND VIDEO BRONCHOSCOPY WITH ENDOBRONCHIAL NAVIGATION  Patient Location: PACU  Anesthesia Type:General  Level of Consciousness: awake, alert  and patient cooperative  Airway & Oxygen Therapy: Patient Spontanous Breathing and Patient connected to face mask oxygen  Post-op Assessment: Report given to RN and Post -op Vital signs reviewed and stable  Post vital signs: Reviewed and stable  Last Vitals:  Vitals Value Taken Time  BP 112/61 07/08/21 1436  Temp 36.4 C 07/08/21 1435  Pulse 70 07/08/21 1443  Resp 17 07/08/21 1443  SpO2 96 % 07/08/21 1444  Vitals shown include unvalidated device data.  Last Pain:  Vitals:   07/08/21 1435  TempSrc:   PainSc: 0-No pain         Complications: No notable events documented.

## 2021-07-08 NOTE — Procedures (Signed)
ELECTROMAGNETIC NAVIGATIONAL BRONCHOSCOPY PROCEDURE NOTE  FIBEROPTIC BRONCHOSCOPY WITH THERAPEUTIC ASPIRATION OF TRACHEOBRONCHIAL TREE AND BRONCHOALVEOLAR LAVAGE PROCEDURE NOTE  ENDOBRONCHIAL ULTRASOUND PROCEDURE NOTE    Flexible bronchoscopy was performed  by : Lanney Gins MD  assistance by : 1)Repiratory therapist  and 2)[employer] staff and 3) Anesthesia team and 4) Flouroscopy team and 5) [employer] supporting staff   Indication for the procedure was :  Pre-procedural H&P. The following assessment was performed on the day of the procedure prior to initiating sedation History:  Chest pain n Dyspnea y Hemoptysis n Cough y Fever n Other pertinent items n  Examination Vital signs -reviewed as per nursing documentation today Cardiac    Murmurs: n  Rubs : n  Gallop: n Lungs Wheezing: n Rales : n Rhonchi :y  Other pertinent findings: SOB/hypoxemia due to chronic lung disease   Pre-procedural assessment for Procedural Sedation included: Depth of sedation: As per anesthesia team  ASA Classification:  2 Mallampati airway assessment: 3    Medication list reviewed: y  The patient's interval history was taken and revealed: no new complaints The pre- procedure physical examination revealed: No new findings Refer to prior clinic note for details.  Informed Consent: Informed consent was obtained from:  patient after explanation of procedure and risks, benefits, as well as alternative procedures available.  Explanation of level of sedation and possible transfusion was also provided.    Procedural Preparation: Time out was performed and patient was identified by name and birthdate and procedure to be performed and side for sampling, if any, was specified. Pt was intubated by anesthesia.  The patient was appropriately draped.   Fiberoptic bronchoscopy with airway inspection and BAL Procedure findings:  Bronchoscope was inserted via ETT  without difficulty.  Posterior  oropharynx, epiglottis, arytenoids, false cords and vocal cords were not visualized as these were bypassed by endotracheal tube. The distal trachea was normal in circumference and appearance without mucosal, cartilaginous or branching abnormalities.  The main carina was mildly splayed . All right and left lobar airways were visualized to the Subsegmental level.  Sub- sub segmental carinae were identified in all the distal airways.   Secretions were visible in the following airways and appeared to be clear.  The mucosa was : friable at RUL  Airways were notable for:        exophytic lesions :n       extrinsic compression in the following distributions: n.       Friable mucosa: YES AT RUL       Anthrocotic material /pigmentation: n     Post procedure Diagnosis:   MUCUS PLUGGING WORSE AT RML AND RUL EVACUATED . EDEMA OF RUL POSTERIOR SEGMENT     Electromagnetic Navigational Bronchoscopy Procedure Findings:  After appropriate CT-guided planning ENB scope was advanced via endotracheal tube and LG was advanced for registration.  Post appropriate planning and registration peripheral navigation was used to visualize target lesion.    ENB GUIDED BAL PERFORMED AT RUL  CYTOBRUSH X2 AT RIGHT UPPER LOBE = ATYPICAL CELLS NOTED BY CYTOTECK  SURGICAL BIOPSY OF RIGHT UPPER LOBE X 4 SENT FOR PATHOLOGY REVIEW  Post procedure diagnosis: LUNG CANCER      Endobronchial ultrasound assisted hilar and mediastinal lymph node biopsies procedure findings: The fiberoptic bronchoscope was removed and the EBUS scope was introduced. Examination began to evaluate for pathologically enlarged lymph nodes starting on the LEFT SIDE side progressing to the RIGHT side.  All lymph node biopsies performed with 21G needle. Lymph  node biopsies were sent in cytolite for all stations.   STATION 4L - 6MM NOT BIOPSIED STATION 4R - 1.3CM - BIOPSIED 3 TIMES  STATION 104 - 1.4CM BIOPSIED 3 TIMES STATION 7 - 1.8CM BIOPSIED 3  TIMES  Post procedure diagnosis:  ENLARGED LYMPHADENOPATHY SUGGESTIVE OF POSSIBLE METASTATIS   Specimens obtained included:                      Cytology brushes : RUL X2  Broncho-alveolar lavage site:RUL  sent for CYTOLOGY                              29m volume infused 15 [facility]    Immediate sampling complications included:NONE  Epinephrine NONE ml was used topically  The bronchoscopy was terminated due to completion of the planned procedure and the bronchoscope was removed.   Total dosage of Lidocaine was NONE mg Total fluoroscopy time was 3.1 minutes   Estimated Blood loss: EXPECTED <5cc.  Complications included:  NONE IMMEDIATE   Preliminary CXR findings :  IN PROCESS  Disposition: HOME WITH FAMILY   Follow up with Dr. ALanney Ginsin 5 days for result discussion.     FOttie GlazierMD  KCalvert BeachDivision of Pulmonary & Critical Care Medicine

## 2021-07-09 NOTE — Anesthesia Postprocedure Evaluation (Signed)
Anesthesia Post Note  Patient: [place] Diann Gernert  Procedure(s) Performed: VIDEO BRONCHOSCOPY WITH ENDOBRONCHIAL ULTRASOUND VIDEO BRONCHOSCOPY WITH ENDOBRONCHIAL NAVIGATION  Patient location during evaluation: PACU Anesthesia Type: General Level of consciousness: awake and alert Pain management: pain level controlled Vital Signs Assessment: post-procedure vital signs reviewed and stable Respiratory status: spontaneous breathing, nonlabored ventilation, respiratory function stable and patient connected to nasal cannula oxygen Cardiovascular status: blood pressure returned to baseline and stable Postop Assessment: no apparent nausea or vomiting Anesthetic complications: no   No notable events documented.   Last Vitals:  Vitals:   07/08/21 1545 07/08/21 1603  BP: (!) 103/55 (!) 105/55  Pulse: 67   Resp: 14   Temp:    SpO2: 94% 94%    Last Pain:  Vitals:   07/08/21 1603  TempSrc:   PainSc: 0-No pain                 Precious Haws Jennavieve Arrick

## 2021-07-10 DIAGNOSIS — M6281 Muscle weakness (generalized): Secondary | ICD-10-CM | POA: Diagnosis not present

## 2021-07-10 LAB — CYTOLOGY - NON PAP

## 2021-07-13 ENCOUNTER — Other Ambulatory Visit: Payer: Self-pay | Admitting: Anatomic Pathology & Clinical Pathology

## 2021-07-13 DIAGNOSIS — M6281 Muscle weakness (generalized): Secondary | ICD-10-CM | POA: Diagnosis not present

## 2021-07-13 LAB — SURGICAL PATHOLOGY

## 2021-07-16 ENCOUNTER — Other Ambulatory Visit: Payer: Medicare HMO

## 2021-07-16 DIAGNOSIS — C3411 Malignant neoplasm of upper lobe, right bronchus or lung: Secondary | ICD-10-CM | POA: Insufficient documentation

## 2021-07-16 NOTE — Progress Notes (Signed)
Tumor Board Documentation  Annette Quinn was presented by Norvel Richards, RN at our Tumor Board on 07/16/2021, which included representatives from medical oncology, surgical, pharmacy, pulmonology, genetics, radiology, pathology, radiation oncology, navigation, research, internal medicine, palliative care.  Annette Quinn currently presents as a new patient, for [place], for new positive pathology with history of the following treatments: surgical intervention(s).  Additionally, we reviewed previous medical and familial history, history of present illness, and recent lab results along with all available histopathologic and imaging studies. The tumor board considered available treatment options and made the following recommendations:   Surgery vs SBRT, Will see Dr Rogue Bussing 07/21/21 and further discuss tretment options  The following procedures/referrals were also placed: No orders of the defined types were placed in this encounter.   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  Tumor board is a meeting of clinicians from various specialty areas who evaluate and discuss patients for whom a multidisciplinary approach is being considered. Final determinations in the plan of care are those of the provider(s). The responsibility for follow up of recommendations given during tumor board is that of the provider.   Today's extended care, comprehensive team conference, Annette Quinn was not present for the discussion and was not examined.   Multidisciplinary Tumor Board is a multidisciplinary case peer review process.  Decisions discussed in the Multidisciplinary Tumor Board reflect the opinions of the specialists present at the conference without having examined the patient.  Ultimately, treatment and diagnostic decisions rest with the primary  provider(s) and the patient.

## 2021-07-17 DIAGNOSIS — M6281 Muscle weakness (generalized): Secondary | ICD-10-CM | POA: Diagnosis not present

## 2021-07-21 ENCOUNTER — Other Ambulatory Visit: Payer: Self-pay

## 2021-07-21 ENCOUNTER — Inpatient Hospital Stay: Payer: Medicare HMO | Attending: Internal Medicine | Admitting: Internal Medicine

## 2021-07-21 ENCOUNTER — Encounter: Payer: Self-pay | Admitting: *Deleted

## 2021-07-21 ENCOUNTER — Encounter: Payer: Self-pay | Admitting: Internal Medicine

## 2021-07-21 ENCOUNTER — Inpatient Hospital Stay: Payer: Medicare HMO

## 2021-07-21 DIAGNOSIS — C3411 Malignant neoplasm of upper lobe, right bronchus or lung: Secondary | ICD-10-CM

## 2021-07-21 DIAGNOSIS — M6281 Muscle weakness (generalized): Secondary | ICD-10-CM | POA: Diagnosis not present

## 2021-07-21 DIAGNOSIS — Z79899 Other long term (current) drug therapy: Secondary | ICD-10-CM | POA: Diagnosis not present

## 2021-07-21 DIAGNOSIS — J432 Centrilobular emphysema: Secondary | ICD-10-CM | POA: Insufficient documentation

## 2021-07-21 NOTE — Progress Notes (Signed)
Met with patient during initial consult with Dr. Rogue Bussing. All questions answered during visit. Reviewed upcoming appts. Contact info given and instructed to call with any questions or needs. Pt verbalized understanding. Nothing further needed at this time.

## 2021-07-21 NOTE — Progress Notes (Signed)
[place] NOTE  Patient Care Team: Kirk Ruths, MD as PCP - General (Internal Medicine) Telford Nab, RN as Oncology Nurse Navigator Cammie Sickle, MD as Consulting Physician (Oncology)  CHIEF COMPLAINTS/PURPOSE OF CONSULTATION: lung cancer  #  Oncology History Overview Note  IMPRESSION: 1. Hypermetabolic posterior right upper lobe pulmonary nodule, consistent with primary bronchogenic carcinoma. 2. Left upper lobe subcentimeter pleural-based nodule is favored to correspond to subtle hypermetabolism and is suspicious for a synchronous primary bronchogenic carcinoma versus less likely isolated metastasis. 3. No thoracic nodal or extrathoracic hypermetabolic metastasis. 4. Greater curvature gastric wall thickening, suspicious for gastritis. 5. Incidental findings, including: Cholelithiasis. Aortic atherosclerosis (ICD10-I70.0) and emphysema (ICD10-J43.9).     Electronically Signed   By: Abigail Miyamoto M.D.   On: 06/12/2021 15:04  # MAY- JUNE 2023- Right UL Ca- SQUAMOUS CELL Stage I [Dr.Aleskerov];    # ? Left UL- ? Second primary- high risk for biopsy [discussed at tumor conference]    Primary malignant neoplasm of right upper lobe of lung ([place])  07/16/2021 Initial Diagnosis   Primary malignant neoplasm of right upper lobe of lung ([place])   07/21/2021 Cancer Staging   Staging form: Lung, AJCC 8th Edition - Clinical: Stage IA3 (cT1c, cN0, cM0) - Signed by Cammie Sickle, MD on 07/21/2021      HISTORY OF PRESENTING ILLNESS: Walks with a cane.  Accompanied by her husband. Annette Quinn 71 y.o.  female history of smoking is here for further evaluation and recommendations for lung cancer.   Patient diagnosed with was recently admitted to hospital for sepsis.  Incidentally noted to have a lung nodule on imaging in the hospital.  This was followed by further work-up including a CT scan outpatient-noted as above; right upper lobe  lung nodule and a left upper lobe peripheral small lung nodule.  Patient was recently evaluated by Dr. Frankey Poot.   Patient underwent biopsy.  She is here to review the results/next plan of care.  Review of Systems  Constitutional:  Positive for weight loss. Negative for chills, diaphoresis, fever and malaise/fatigue.  HENT:  Negative for nosebleeds and sore throat.   Eyes:  Negative for double vision.  Respiratory:  Positive for shortness of breath. Negative for cough, hemoptysis, sputum production and wheezing.   Cardiovascular:  Negative for chest pain, palpitations, orthopnea and leg swelling.  Gastrointestinal:  Negative for abdominal pain, blood in stool, constipation, diarrhea, heartburn, melena, nausea and vomiting.  Genitourinary:  Negative for dysuria, frequency and urgency.  Musculoskeletal:  Positive for back pain and joint pain.  Skin: Negative.  Negative for itching and rash.  Neurological:  Positive for tingling. Negative for dizziness, focal weakness, weakness and headaches.  Endo/Heme/Allergies:  Does not bruise/bleed easily.  Psychiatric/Behavioral:  Negative for depression. The patient is not nervous/anxious and does not have insomnia.      MEDICAL HISTORY:  Past Medical History:  Diagnosis Date   Adopted    Anemia    during pregnancy only   Arthritis    osteoarthritis   Atrial flutter ([place])    Blood clot in vein 1975   Cancer ([place])    Complication of anesthesia    body aches after anesthesia   Depression    Diverticulosis    Family history of adverse reaction to anesthesia    pt is adopted and is unsure   GERD (gastroesophageal reflux disease)    Heart murmur    Hepatitis    as a child  History of bronchitis    Hypercholesterolemia    Motion sickness    curvy/hilly roads   Neuropathy    upper legs   Osteoporosis    Pneumonia 03/2021   Sepsis ([place])    Sinus infection 06/2021   Skin cancer (melanoma) ([facility])    left upper thigh   Skin cancer of  face    basal cell   Sleep apnea    CPAP   Tremor    UTI (urinary tract infection)    Wears dentures    full upper and lower    SURGICAL HISTORY: Past Surgical History:  Procedure Laterality Date   ABDOMINAL HYSTERECTOMY  1979   BLADDER SUSPENSION  1992   BREAST BIOPSY Left 2010   benign   CATARACT EXTRACTION W/PHACO Right 12/08/2016   Procedure: CATARACT EXTRACTION PHACO AND INTRAOCULAR LENS PLACEMENT (IOC)-RIGHT;  Surgeon: Birder Robson, MD;  Location: [facility];  Service: Ophthalmology;  Laterality: Right;  [place] 00:56.6 AP% 11.5 CDE 6.52 Fluid Pack lot # [id number]   CATARACT EXTRACTION W/PHACO Left 01/04/2017   Procedure: CATARACT EXTRACTION PHACO AND INTRAOCULAR LENS PLACEMENT (IOC);  Surgeon: Birder Robson, MD;  Location: [facility];  Service: Ophthalmology;  Laterality: Left;  [place] 00:33.9 AP% 14.5 CDE 4.91 Fluid Pack lot # [id number]   COLONOSCOPY  2010, 2015   COLONOSCOPY WITH PROPOFOL N/A 05/02/2019   Procedure: COLONOSCOPY WITH PROPOFOL;  Surgeon: Toledo, Benay Pike, MD;  Location: [facility];  Service: Gastroenterology;  Laterality: N/A;   DILATION AND CURETTAGE OF UTERUS  1979   DISTAL INTERPHALANGEAL JOINT FUSION Left 08/26/2016   Procedure: DISTAL INTERPHALANGEAL JOINT FUSION-LEFT INDEX FINGER;  Surgeon: Hessie Knows, MD;  Location: [facility];  Service: Orthopedics;  Laterality: Left;   FRACTURE SURGERY     left arm   HAMMER TOE SURGERY Left 1989   HAMMER TOE SURGERY Right 03/31/2016   Procedure: HAMMER TOE CORRECTION RIGHT T6 T7;  Surgeon: Samara Deist, DPM;  Location: [facility];  Service: Podiatry;  Laterality: Right;  sleep apnea   KNEE ARTHROSCOPY W/ PARTIAL MEDIAL MENISCECTOMY Right 03/05/2014   medial and lateral.  Abrasion hondroplasty of medial femoral condyle and femorla trochlea.   NASAL SEPTUM SURGERY     REVERSE SHOULDER ARTHROPLASTY Left 01/20/2017   Procedure: REVERSE SHOULDER ARTHROPLASTY;  Surgeon: Corky Mull, MD;  Location: [facility];  Service: Orthopedics;  Laterality: Left;   TONSILLECTOMY  1973   TRIGGER FINGER RELEASE Left    thumb   VEIN LIGATION     VIDEO BRONCHOSCOPY WITH ENDOBRONCHIAL NAVIGATION N/A 07/08/2021   Procedure: VIDEO BRONCHOSCOPY WITH ENDOBRONCHIAL NAVIGATION;  Surgeon: Ottie Glazier, MD;  Location: [facility];  Service: Thoracic;  Laterality: N/A;   VIDEO BRONCHOSCOPY WITH ENDOBRONCHIAL ULTRASOUND N/A 07/08/2021   Procedure: VIDEO BRONCHOSCOPY WITH ENDOBRONCHIAL ULTRASOUND;  Surgeon: Ottie Glazier, MD;  Location: [facility];  Service: Thoracic;  Laterality: N/A;   WEIL OSTEOTOMY Right 03/31/2016   Procedure: WEIL OSTEOTOMY X2 RIGHT FOOT;  Surgeon: Samara Deist, DPM;  Location: [facility];  Service: Podiatry;  Laterality: Right;  IV WITH POPLITEAL    SOCIAL HISTORY: Social History   Socioeconomic History   Marital status: Married    Spouse name: Not on file   Number of children: Not on file   Years of education: Not on file   Highest education level: Not on file  Occupational History   Not on file  Tobacco Use   Smoking status: Former    Packs/day: 1.00  Years: 43.00    Total pack years: 43.00    Types: Cigarettes    Quit date: 07/20/2010    Years since quitting: 11.0   Smokeless tobacco: Never  Vaping Use   Vaping Use: Some days   Substances: Flavoring  Substance and Sexual Activity   Alcohol use: No   Drug use: No   Sexual activity: Not on file  Other Topics Concern   Not on file  Social History Narrative   Lives in [place] husband- handicapped son- 30 years [adopted; TBI- sec to abuse] quit smoking- June 11th, 2011. No alcohol.    Social Determinants of Health   Financial Resource Strain: Not on file  Food Insecurity: Not on file  Transportation Needs: Not on file  Physical Activity: Not on file  Stress: Not on file  Social Connections: Not on file  Intimate Partner Violence: Not on file    FAMILY HISTORY: Family History  Adopted: Yes  Problem  Relation Age of Onset   Breast cancer Neg Hx     ALLERGIES:  is allergic to actonel [risedronate sodium], boniva [ibandronic acid], fosamax [alendronate], lopid [gemfibrozil], penicillins, reclast [zoledronic acid], strawberry (diagnostic), tape, and doxycycline.  MEDICATIONS:  Current Outpatient Medications  Medication Sig Dispense Refill   acetaminophen (TYLENOL) 650 MG CR tablet Take 650-1,300 mg by mouth every 8 (eight) hours as needed for pain.      albuterol (PROVENTIL HFA;VENTOLIN HFA) 108 (90 Base) MCG/ACT inhaler Inhale 2 puffs into the lungs every 6 (six) hours as needed for wheezing or shortness of breath.      Azelastine-Fluticasone 137-50 MCG/ACT SUSP Place 1 spray into the nose 2 (two) times daily as needed (for allergies.).      bumetanide (BUMEX) 2 MG tablet Take 2 mg by mouth every morning.     busPIRone (BUSPAR) 7.5 MG tablet Take 7.5 mg by mouth 2 (two) times daily.     Calcium Carb-Cholecalciferol (OYSTER SHELL CALCIUM) 500-400 MG-UNIT TABS Take 1 tablet by mouth daily.     Cholecalciferol 2000 units CAPS Take 2,000 Units by mouth daily before breakfast.      fenofibrate 160 MG tablet Take 160 mg by mouth daily at 12 noon.      gabapentin (NEURONTIN) 300 MG capsule Take 600 mg by mouth 3 (three) times daily.      montelukast (SINGULAIR) 10 MG tablet Take 10 mg by mouth every morning.      pantoprazole (PROTONIX) 40 MG tablet Take 40 mg by mouth every morning.     propranolol (INDERAL) 20 MG tablet Take 1 tablet (20 mg total) by mouth 2 (two) times daily. Hold if heart rate less than 60.     raloxifene (EVISTA) 60 MG tablet Take 60 mg by mouth daily at 12 noon.      sertraline (ZOLOFT) 100 MG tablet Take 200 mg by mouth daily with lunch.      No current facility-administered medications for this visit.      Marland Kitchen  PHYSICAL EXAMINATION: ECOG PERFORMANCE STATUS: 1 - Symptomatic but completely ambulatory  Vitals:   07/21/21 0837  BP: (!) 112/100  Pulse: (!) 49  Temp:  98.6 F (37 C)  SpO2: 97%   Filed Weights   07/21/21 0837  Weight: 207 lb 3.2 oz (94 kg)    Physical Exam Vitals and nursing note reviewed.  HENT:     Head: Normocephalic and atraumatic.     Mouth/Throat:     Pharynx: Oropharynx is clear.  Eyes:  Extraocular Movements: Extraocular movements intact.     Pupils: Pupils are equal, round, and reactive to light.  Cardiovascular:     Rate and Rhythm: Normal rate and regular rhythm.  Pulmonary:     Comments: Decreased breath sounds bilaterally.  Abdominal:     Palpations: Abdomen is soft.  Musculoskeletal:        General: Normal range of motion.     Cervical back: Normal range of motion.  Skin:    General: Skin is warm.  Neurological:     General: No focal deficit present.     Mental Status: She is alert and oriented to person, place, and time.  Psychiatric:        Behavior: Behavior normal.        Judgment: Judgment normal.      LABORATORY DATA:  I have reviewed the data as listed Lab Results  Component Value Date   WBC 7.2 04/04/2021   HGB 13.3 04/04/2021   HCT 42.3 04/04/2021   MCV 89.6 04/04/2021   PLT 248 04/04/2021   Recent Labs    04/03/21 1444 04/04/21 0732 04/05/21 0516 04/06/21 0606  NA 136 136 137 143  K 3.0* 3.4* 3.4* 3.8  CL 94* 95* 97* 98  CO2 30 31 31 30   GLUCOSE 106* 105* 96 90  BUN 13 9 8 9   CREATININE 0.77 0.76 0.61 0.69  CALCIUM 8.9 8.5* 8.4* 8.7*  GFRNONAA >60 >60 >60 >60  PROT 7.9 6.8  --   --   ALBUMIN 3.5 3.0*  --   --   AST 26 21  --   --   ALT 38 29  --   --   ALKPHOS 37* 33*  --   --   BILITOT 0.9 0.5  --   --     RADIOGRAPHIC STUDIES: I have personally reviewed the radiological images as listed and agreed with the findings in the report. DG Chest Port 1 View  Result Date: 07/08/2021 CLINICAL DATA:  Status post bronchoscopy EXAM: PORTABLE CHEST 1 VIEW COMPARISON:  Chest x-ray dated April 03, 2021 FINDINGS: Cardiac and mediastinal contours are unchanged. Nodular  opacity of the right upper lobe with new surrounding opacities, likely post bronchoscopy changes. No large pleural effusion or pneumothorax. IMPRESSION: No evidence of pneumothorax status post bronchoscopy. Electronically Signed   By: Yetta Glassman M.D.   On: 07/08/2021 15:37   DG C-Arm 1-60 Min-No Report  Result Date: 07/08/2021 Fluoroscopy was utilized by the requesting physician.  No radiographic interpretation.   CT Super D Chest Wo Contrast  Result Date: 07/08/2021 CLINICAL DATA:  Hypermetabolic right upper lobe pulmonary nodule. Pre bronchoscopy. EXAM: CT CHEST WITHOUT CONTRAST TECHNIQUE: Multidetector CT imaging of the chest was performed using thin slice collimation for electromagnetic bronchoscopy planning purposes, without intravenous contrast. RADIATION DOSE REDUCTION: This exam was performed according to the departmental dose-optimization program which includes automated exposure control, adjustment of the mA and/or kV according to patient size and/or use of iterative reconstruction technique. COMPARISON:  PET-CT 06/11/2021.  Chest CT 05/14/2021 and 04/03/2021. FINDINGS: Cardiovascular: Atherosclerosis of the aorta, great vessels and coronary arteries. No acute vascular findings on noncontrast imaging. The heart size is normal. There is no pericardial effusion. Mediastinum/Nodes: There are no enlarged mediastinal, hilar or axillary lymph nodes.Small mediastinal lymph nodes are unchanged. Hilar assessment is limited by the lack of intravenous contrast, although the hilar contours appear unchanged. The thyroid gland, trachea and esophagus demonstrate no significant findings. Lungs/Pleura: No pleural effusion  or pneumothorax. The dominant right upper lobe mass which was hypermetabolic on PET-CT is not significantly changed, measuring approximately 3.0 x 1.9 cm on image 37/3. 5 mm subpleural right upper lobe nodule on image 38/3 and 7 x 2 mm left upper lobe subpleural nodule on image 32/3 are  unchanged. The lateral was mildly hypermetabolic on PET-CT. No new or enlarging nodules. Underlying moderate centrilobular and paraseptal emphysema. Upper abdomen: The visualized upper abdomen appears stable without significant findings. Musculoskeletal/Chest wall: There is no chest wall mass or suspicious osseous finding. Mild thoracic spondylosis. Previous left shoulder arthroplasty. IMPRESSION: 1. Imaging for bronchoscopy planning and guidance. 2. The dominant right upper lobe lesion which was hypermetabolic on PET-CT is not significantly changed from the recent prior studies done over the last 2 months. 3. Other small upper lobe pulmonary nodules bilaterally are unchanged. Of note, a subpleural nodule in the left upper lobe was felt to be mildly hypermetabolic on PET-CT and CT follow-up recommended if not addressed by planned bronchoscopy. 4. Coronary and aortic atherosclerosis (ICD10-I70.0). Emphysema (ICD10-J43.9). Electronically Signed   By: Richardean Sale M.D.   On: 07/08/2021 11:56    ASSESSMENT & PLAN:   Primary malignant neoplasm of right upper lobe of lung [facility]) # JUNE 2023- Right upper lobe stage I S/P BIOPSY [Dr.Aleskerov]NSCLC - favor Squamous cell.   #I discussed early stage lung cancer treated with surgery with a curative.  However patient is considered not a good candidate for surgery [given age comorbidities]; advanced emphysema.  #With contraindication to surgery-the next best option would be SBRT.  Discussed the mechanism of action of radiation; and the schedule.  However will defer to Dr. Donella Stade for further details.  We will make a referral to radiation oncology today.  #Left upper lobe peripheral nodule-too small to biopsy/not safe for biopsy.  Patient high risk of pneumothorax.  Discussed at the tumor conference.  Patient may need to continue to follow-up on further imaging.  # COPD/CPAP- on 2lits/ home O2 on extertion-continue follow-up with pulmonary.  #Clinical trials: I  introduced a clinical trial through [employer] " Blood Sample Collection to Evaluate Biomarkers in Subjects with Untreated Sold Tumors.  It would need extra vials of blood.  Also discussed that this would not anyway alter his treatment course/plan.  Patient interested.  Discussed with clinical trials nurse.  # Thank you Dr.Aleskerov  for allowing me to participate in the care of your pleasant patient. Please do not hesitate to contact me with questions or concerns in the interim.  Patient will follow-up with Dr. Donella Stade moving forward/surveillance scans.  However we will happy to follow if needed.  Discussed with [facility], lung navigator.  # DISPOSITION:  # labs- no labs from mystand point; but leave up to clinical trials # follow up TBD- Dr.B  # I reviewed the blood work- with the patient in detail; also reviewed the imaging independently [as summarized above]; and with the patient in detail.  '    All questions were answered. The patient knows to call the clinic with any problems, questions or concerns.       Cammie Sickle, MD 07/21/2021 11:31 AM

## 2021-07-21 NOTE — Research (Cosign Needed Addendum)
Trial: [employer], [employer] - "Specimen Collection Study to Evaluate Biomarkers in Subjects with Cancer"   Patient Annette Quinn was identified by Jeral Fruit, RN as a potential candidate for the above listed study.  This Clinical Research Nurse met with Annette Quinn, [id number], on 07/21/21 in a manner and location that ensures patient privacy to discuss participation in the above listed research study.  Patient is Accompanied by her spouse .  A copy of the informed consent document with embedded HIPAA language was provided to the patient.  Patient reads, speaks, and understands [place].    Patient was provided with the business card of this Nurse and encouraged to contact the research team with any questions.  Patient was provided the option of taking informed consent documents home to review and was encouraged to review at their convenience with their support network, including other care providers. Patient is comfortable with making a decision regarding study participation today.  As outlined in the informed consent form, this Nurse and Annette Quinn discussed the purpose of the research study, the investigational nature of the study, study procedures and requirements for study participation, potential risks and benefits of study participation, as well as alternatives to participation. This study is not blinded. The patient understands participation is voluntary and they may withdraw from study participation at any time.  This study does not involve randomization.  This study does not involve an investigational drug or device. This study does not involve a placebo. Patient understands enrollment is pending full eligibility review.   Confidentiality and how the patient's information will be used as part of study participation were discussed.  Patient was informed there is reimbursement provided for their time and effort spent on trial participation.  The patient is encouraged to discuss  research study participation with their insurance provider to determine what costs they may incur as part of study participation, including research related injury.    All questions were answered to patient's satisfaction.  The informed consent with embedded HIPAA language was reviewed page by page.  The patient's mental and emotional status is appropriate to provide informed consent, and the patient verbalizes an understanding of study participation.  Patient has agreed to participate in the above listed research study and has voluntarily signed the informed consent dated 09 Mar 2021 version 3 with embedded HIPAA language, version 3  on 07/21/21 at 0933AM.  The patient was provided with a copy of the signed informed consent form with embedded HIPAA language for their reference.  No study specific procedures were obtained prior to the signing of the informed consent document.  Approximately 20 minutes were spent with the patient reviewing the informed consent documents.  After obtaining informed consent patient, voluntarily signed the optional Release of Information form for use throughout trial participation.   Medical History:  High Blood Pressure  No Coronary Artery Disease No Lupus    No Rheumatoid Arthritis  No Diabetes   No      If yes, which type?       Lynch Syndrome  No  Is the patient currently taking a magnesium supplement?   No If yes, dose and frequency?  Indication?  Start date?   Does the patient have a personal history of cancer (greater than 5 years ago)?  Yes If yes, Cancer type and date of diagnosis?   Skin, basal cells  Has this previous diagnosis been treated? yes      If so, treatment type? Removal / surgery Start  and end dates of last treatment cycle? 2013 per patient   Does the patient have a family history of cancer in 1st or 2nd degree relatives? No If yes, Relationship(s) and Cancer type(s)?   Does the patient have history of alcohol consumption? No   If yes,  current or former?  If former, year stopped?  Number of years?  Drinks per week?   Does the patient have history of cigarette, cigar, pipe, or chewing tobacco use?  Yes  If yes, current for former? Former If yes, type (Cigarette, cigar, pipe, and/or chewing tobacco)? Cigarette   If former, year stopped? 2012 Number of years? 43 Packs/number/containers per day? 1  Eligibility: Eligibility criteria reviewed with patient. This Nurse has reviewed this patient's inclusion and exclusion criteria and confirmed patient is eligible for study participation. Eligibility confirmed by treating investigator, who also agrees that patient should proceed with enrollment. Patient will continue with enrollment. Blood Collection: Research blood obtained by [place], phlebotomist, via [facility] venipuncture per patient's preference. Patient tolerated well without any adverse events. Gift Card: $50 gift card given to patient for her participation in this study by [employer], [place].   This patient is not taking magnesium supplements.  The patient does not report family history of cancer in 1st or 2nd degree relatives.    The patient reports no history of alcohol consumption.  The patient reports a history of tobacco use. Jeral Fruit, RN 07/21/21 10:02 AM  Research nurse spoke with the patient via telephone today as unable to find the pathology results from the 2013 basal cell skin carcinoma she reported. The patient states she had that removed at the [facility] office in [place], [place] by Dr. Maurene Capes. This nurse spoke with the medical records department at [facility] and they state they do not have that pathology anymore. She states that their records were all paper until 2016 and they  no longer have any records in office prior to that time.  Jeral Fruit, RN, BSN, OCN Date: 07/23/2021 Time: 1202 pm

## 2021-07-21 NOTE — Assessment & Plan Note (Addendum)
#   JUNE 2023- Right upper lobe stage I S/P BIOPSY [Dr.Aleskerov]NSCLC - favor Squamous cell.   #I discussed early stage lung cancer treated with surgery with a curative.  However patient is considered not a good candidate for surgery [given age comorbidities]; advanced emphysema.  #With contraindication to surgery-the next best option would be SBRT.  Discussed the mechanism of action of radiation; and the schedule.  However will defer to Dr. Donella Stade for further details.  We will make a referral to radiation oncology today.  #Left upper lobe peripheral nodule-too small to biopsy/not safe for biopsy.  Patient high risk of pneumothorax.  Discussed at the tumor conference.  Patient may need to continue to follow-up on further imaging.  # COPD/CPAP- on 2lits/ home O2 on extertion-continue follow-up with pulmonary.  #Clinical trials: I introduced a clinical trial through [employer] " Blood Sample Collection to Evaluate Biomarkers in Subjects with Untreated Sold Tumors.  It would need extra vials of blood.  Also discussed that this would not anyway alter his treatment course/plan.  Patient interested.  Discussed with clinical trials nurse.  # Thank you Dr.Aleskerov  for allowing me to participate in the care of your pleasant patient. Please do not hesitate to contact me with questions or concerns in the interim.  Patient will follow-up with Dr. Donella Stade moving forward/surveillance scans.  However we will happy to follow if needed.  Discussed with [facility], lung navigator.  # DISPOSITION:  # labs- no labs from mystand point; but leave up to clinical trials # follow up TBD- Dr.B  # I reviewed the blood work- with the patient in detail; also reviewed the imaging independently [as summarized above]; and with the patient in detail.  '

## 2021-07-22 DIAGNOSIS — M1711 Unilateral primary osteoarthritis, right knee: Secondary | ICD-10-CM | POA: Diagnosis not present

## 2021-07-22 DIAGNOSIS — M25561 Pain in right knee: Secondary | ICD-10-CM | POA: Diagnosis not present

## 2021-07-22 DIAGNOSIS — G8929 Other chronic pain: Secondary | ICD-10-CM | POA: Diagnosis not present

## 2021-07-22 NOTE — Research (Signed)
Late Entry: This Nurse has reviewed this patient's inclusion and exclusion criteria as a second review yesterday, prior to patient being approached by consenting nurse. I confirmed yesterday (07/21/2021), that  [place] was eligible for study participation, and that patient continue with enrollment.   Maxwell Marion, RN, BSN, [facility] Clinical Research Nurse Lead 07/22/2021 11:57 AM

## 2021-07-23 DIAGNOSIS — N1831 Chronic kidney disease, stage 3a: Secondary | ICD-10-CM | POA: Diagnosis not present

## 2021-07-23 DIAGNOSIS — L568 Other specified acute skin changes due to ultraviolet radiation: Secondary | ICD-10-CM | POA: Diagnosis not present

## 2021-07-23 DIAGNOSIS — E781 Pure hyperglyceridemia: Secondary | ICD-10-CM | POA: Diagnosis not present

## 2021-07-23 DIAGNOSIS — R7303 Prediabetes: Secondary | ICD-10-CM | POA: Diagnosis not present

## 2021-07-24 DIAGNOSIS — M6281 Muscle weakness (generalized): Secondary | ICD-10-CM | POA: Diagnosis not present

## 2021-07-27 DIAGNOSIS — M6281 Muscle weakness (generalized): Secondary | ICD-10-CM | POA: Diagnosis not present

## 2021-07-29 DIAGNOSIS — M6281 Muscle weakness (generalized): Secondary | ICD-10-CM | POA: Diagnosis not present

## 2021-07-29 DIAGNOSIS — J449 Chronic obstructive pulmonary disease, unspecified: Secondary | ICD-10-CM | POA: Diagnosis not present

## 2021-07-30 ENCOUNTER — Encounter: Payer: Self-pay | Admitting: *Deleted

## 2021-07-30 ENCOUNTER — Ambulatory Visit
Admission: RE | Admit: 2021-07-30 | Discharge: 2021-07-30 | Disposition: A | Discharge status: Home / Self Care | Payer: Medicare HMO | Source: Ambulatory Visit | Attending: Radiation Oncology | Admitting: Radiation Oncology

## 2021-07-30 VITALS — BP 145/81 | HR 52 | Temp 97.9°F | Resp 20 | Ht 66.0 in | Wt 205.9 lb

## 2021-07-30 DIAGNOSIS — Z0000 Encounter for general adult medical examination without abnormal findings: Secondary | ICD-10-CM | POA: Diagnosis not present

## 2021-07-30 DIAGNOSIS — C3411 Malignant neoplasm of upper lobe, right bronchus or lung: Secondary | ICD-10-CM

## 2021-07-30 DIAGNOSIS — N1831 Chronic kidney disease, stage 3a: Secondary | ICD-10-CM | POA: Diagnosis not present

## 2021-07-30 DIAGNOSIS — E781 Pure hyperglyceridemia: Secondary | ICD-10-CM | POA: Diagnosis not present

## 2021-07-30 DIAGNOSIS — R7303 Prediabetes: Secondary | ICD-10-CM | POA: Diagnosis not present

## 2021-07-30 DIAGNOSIS — F325 Major depressive disorder, single episode, in full remission: Secondary | ICD-10-CM | POA: Diagnosis not present

## 2021-07-30 DIAGNOSIS — Z1389 Encounter for screening for other disorder: Secondary | ICD-10-CM | POA: Diagnosis not present

## 2021-07-30 NOTE — Progress Notes (Signed)
Met with patient during initial consult with Dr. Baruch Gouty to discuss radiation treatment. All questions answered during visit. Reviewed upcoming appts. Instructed to call with any questions or needs. Pt verbalized understanding.

## 2021-07-30 NOTE — Consult Note (Signed)
NEW PATIENT EVALUATION  Name: Annette Quinn  MRN: [id number]  Date:   07/30/2021     DOB: 07/22/1950   This 71 y.o. 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   PREVIOUS INVESTIGATIONS:  CT scans and PET CT scans reviewed Clinical notes reviewed Pathology reports reviewed  HPI: Patient is a 71 year old female admitted to [facility] for sepsis with a UTI was noted to have an abnormal nodule in her right upper lobe.  CT scan showed a 2.8 x 1.9 x 1.5 subpleural right upper lobe nodule concerning for malignancy.  She also has stable scattered subpleural subcentimeter nodules elsewhere in her lungs.  PET scan demonstrated hypermetabolic posterior right upper lobe pulmonary nodule consistent with primary bronchogenic carcinoma.  There was a left left upper lobe subcentimeter pleural-based nodule favored to correspond to cyst a synchronous or primary bronchogenic carcinoma.  Patient underwent bronchoscopy with biopsy of the right upper lobe which was positive for non-small cell lung cancer favoring squamous cell carcinoma.  Left subpleural nodule was too difficult to biopsy.  She is not a surgical candidate.  She continues to smoke.  She is now referred to radiation collagen for consideration of treatment.  She specifically Nuys cough hemoptysis or chest tightness.  PLANNED TREATMENT REGIMEN: SBRT  PAST MEDICAL HISTORY:  has a past medical history of Adopted, Anemia, Arthritis, Atrial flutter ([place]), Blood clot in vein (1975), Cancer ([place]), Complication of anesthesia, Depression, Diverticulosis, Family history of adverse reaction to anesthesia, GERD (gastroesophageal reflux disease), Heart murmur, Hepatitis, History of bronchitis,  Hypercholesterolemia, Motion sickness, Neuropathy, Osteoporosis, Pneumonia (03/2021), Sepsis ([place]), Sinus infection (06/2021), Skin cancer (melanoma) ([place]), Skin cancer of face, Sleep apnea, Tremor, UTI (urinary tract infection), and Wears dentures.    PAST SURGICAL HISTORY:  Past Surgical History:  Procedure Laterality Date   ABDOMINAL HYSTERECTOMY  1979   BLADDER SUSPENSION  1992   BREAST BIOPSY Left 2010   benign   CATARACT EXTRACTION W/PHACO Right 12/08/2016   Procedure: CATARACT EXTRACTION PHACO AND INTRAOCULAR LENS PLACEMENT (IOC)-RIGHT;  Surgeon: Birder Robson, MD;  Location: [facility];  Service: Ophthalmology;  Laterality: Right;  [place] 00:56.6 AP% 11.5 CDE 6.52 Fluid Pack lot # [id number]   CATARACT EXTRACTION W/PHACO Left 01/04/2017   Procedure: CATARACT EXTRACTION PHACO AND INTRAOCULAR LENS PLACEMENT (IOC);  Surgeon: Birder Robson, MD;  Location: [facility];  Service: Ophthalmology;  Laterality: Left;  [place] 00:33.9 AP% 14.5 CDE 4.91 Fluid Pack lot # [id number]   COLONOSCOPY  2010, 2015   COLONOSCOPY WITH PROPOFOL N/A 05/02/2019   Procedure: COLONOSCOPY WITH PROPOFOL;  Surgeon: Toledo, Benay Pike, MD;  Location: [facility];  Service: Gastroenterology;  Laterality: N/A;   DILATION AND CURETTAGE OF UTERUS  1979   DISTAL INTERPHALANGEAL JOINT FUSION Left 08/26/2016   Procedure: DISTAL INTERPHALANGEAL JOINT FUSION-LEFT INDEX FINGER;  Surgeon: Hessie Knows, MD;  Location: [facility];  Service: Orthopedics;  Laterality: Left;   FRACTURE SURGERY     left arm   HAMMER TOE SURGERY Left 1989   HAMMER TOE SURGERY Right 03/31/2016   Procedure: HAMMER TOE CORRECTION RIGHT T6 T7;  Surgeon: Samara Deist, DPM;  Location: [place];  Service: Podiatry;  Laterality: Right;  sleep apnea   KNEE ARTHROSCOPY W/ PARTIAL  MEDIAL MENISCECTOMY Right 03/05/2014   medial and lateral.  Abrasion hondroplasty of medial femoral condyle and femorla trochlea.   NASAL SEPTUM SURGERY     REVERSE  SHOULDER ARTHROPLASTY Left 01/20/2017   Procedure: REVERSE SHOULDER ARTHROPLASTY;  Surgeon: Corky Mull, MD;  Location: [facility];  Service: Orthopedics;  Laterality: Left;   TONSILLECTOMY  1973   TRIGGER FINGER RELEASE Left    thumb   VEIN LIGATION     VIDEO BRONCHOSCOPY WITH ENDOBRONCHIAL NAVIGATION N/A 07/08/2021   Procedure: VIDEO BRONCHOSCOPY WITH ENDOBRONCHIAL NAVIGATION;  Surgeon: Ottie Glazier, MD;  Location: [facility];  Service: Thoracic;  Laterality: N/A;   VIDEO BRONCHOSCOPY WITH ENDOBRONCHIAL ULTRASOUND N/A 07/08/2021   Procedure: VIDEO BRONCHOSCOPY WITH ENDOBRONCHIAL ULTRASOUND;  Surgeon: Ottie Glazier, MD;  Location: [facility];  Service: Thoracic;  Laterality: N/A;   WEIL OSTEOTOMY Right 03/31/2016   Procedure: WEIL OSTEOTOMY X2 RIGHT FOOT;  Surgeon: Samara Deist, DPM;  Location: [place];  Service: Podiatry;  Laterality: Right;  IV WITH POPLITEAL    FAMILY HISTORY: family history is not on file. She was adopted.  SOCIAL HISTORY:  reports that she quit smoking about 11 years ago. Her smoking use included cigarettes. She has a 43.00 pack-year smoking history. She has never used smokeless tobacco. She reports that she does not drink alcohol and does not use drugs.  ALLERGIES: Actonel [risedronate sodium], Boniva [ibandronic acid], Fosamax [alendronate], Lopid [gemfibrozil], Penicillins, Reclast [zoledronic acid], Strawberry (diagnostic), Tape, and Doxycycline  MEDICATIONS:  Current Outpatient Medications  Medication Sig Dispense Refill   acetaminophen (TYLENOL) 650 MG CR tablet Take 650-1,300 mg by mouth every 8 (eight) hours as needed for pain.      albuterol (PROVENTIL HFA;VENTOLIN HFA) 108 (90 Base) MCG/ACT inhaler Inhale 2 puffs into the lungs every 6 (six) hours as needed for wheezing or shortness of breath.      Azelastine-Fluticasone 137-50 MCG/ACT SUSP Place 1 spray into the nose 2 (two) times daily as needed (for allergies.).      bumetanide (BUMEX) 2 MG  tablet Take 2 mg by mouth every morning.     busPIRone (BUSPAR) 7.5 MG tablet Take 7.5 mg by mouth 2 (two) times daily.     Calcium Carb-Cholecalciferol (OYSTER SHELL CALCIUM) 500-400 MG-UNIT TABS Take 1 tablet by mouth daily.     Cholecalciferol 2000 units CAPS Take 2,000 Units by mouth daily before breakfast.      fenofibrate 160 MG tablet Take 160 mg by mouth daily at 12 noon.      gabapentin (NEURONTIN) 300 MG capsule Take 600 mg by mouth 3 (three) times daily.      montelukast (SINGULAIR) 10 MG tablet Take 10 mg by mouth every morning.      pantoprazole (PROTONIX) 40 MG tablet Take 40 mg by mouth every morning.     propranolol (INDERAL) 20 MG tablet Take 1 tablet (20 mg total) by mouth 2 (two) times daily. Hold if heart rate less than 60.     raloxifene (EVISTA) 60 MG tablet Take 60 mg by mouth daily at 12 noon.      sertraline (ZOLOFT) 100 MG tablet Take 200 mg by mouth daily with lunch.      No current facility-administered medications for this encounter.    ECOG PERFORMANCE STATUS:  0 - Asymptomatic  REVIEW OF SYSTEMS: Patient denies any weight loss, fatigue, weakness, fever, chills or night sweats. Patient denies any loss of vision, blurred vision. Patient denies any ringing  of the ears  or hearing loss. No irregular heartbeat. Patient denies heart murmur or history of fainting. Patient denies any chest pain or pain radiating to her upper extremities. Patient denies any shortness of breath, difficulty breathing at night, cough or hemoptysis. Patient denies any swelling in the lower legs. Patient denies any nausea vomiting, vomiting of blood, or coffee ground material in the vomitus. Patient denies any stomach pain. Patient states has had normal bowel movements no significant constipation or diarrhea. Patient denies any dysuria, hematuria or significant nocturia. Patient denies any problems walking, swelling in the joints or loss of balance. Patient denies any skin changes, loss of hair or  loss of weight. Patient denies any excessive worrying or anxiety or significant depression. Patient denies any problems with insomnia. Patient denies excessive thirst, polyuria, polydipsia. Patient denies any swollen glands, patient denies easy bruising or easy bleeding. Patient denies any recent infections, allergies or URI. Patient "s visual fields have not changed significantly in recent time.   PHYSICAL EXAM: BP (!) 145/81   Pulse (!) 52   Temp 97.9 F (36.6 C)   Resp 20   Ht 5\' 6"  (1.676 m)   Wt 205 lb 14.4 oz (93.4 kg)   BMI 33.23 kg/m  Well-developed well-nourished patient in NAD. HEENT reveals PERLA, EOMI, discs not visualized.  Oral cavity is clear. No oral mucosal lesions are identified. Neck is clear without evidence of cervical or supraclavicular adenopathy. Lungs are clear to A&P. Cardiac examination is essentially unremarkable with regular rate and rhythm without murmur rub or thrill. Abdomen is benign with no organomegaly or masses noted. Motor sensory and DTR levels are equal and symmetric in the upper and lower extremities. Cranial nerves II through XII are grossly intact. Proprioception is intact. No peripheral adenopathy or edema is identified. No motor or sensory levels are noted. Crude visual fields are within normal range.  LABORATORY DATA: Pathology reports reviewed    RADIOLOGY RESULTS: CT scans and PET/CT CT scans reviewed compatible with above-stated findings   IMPRESSION: Pathology proven squamous cell carcinoma of the right upper lobe in 71 year old female  PLAN: This time of offered SBRT to her right upper lobe.  We will plan on delivering 60 Gray in 5 fractions.  Would use for dimensional treatment planning as well as motion restriction during simulation.  Risks and benefits of treatment including possible development of a cough slight fatigue and architectural distortion of her lung all were discussed in detail with the patient.  The left upper lobe lesion I would  continue to follow based on his extremely small size and repeat scans about 3 months after completion of her radiation.  Patient comprehends my recommendations well.  Simulation appointment was given.  I would like to take this opportunity to thank you for allowing me to participate in the care of your patient.Noreene Filbert, MD

## 2021-08-05 DIAGNOSIS — M6281 Muscle weakness (generalized): Secondary | ICD-10-CM | POA: Diagnosis not present

## 2021-08-06 ENCOUNTER — Encounter: Payer: Self-pay | Admitting: *Deleted

## 2021-08-06 ENCOUNTER — Ambulatory Visit
Admission: RE | Admit: 2021-08-06 | Discharge: 2021-08-06 | Disposition: A | Discharge status: Home / Self Care | Payer: Medicare HMO | Source: Ambulatory Visit | Attending: Radiation Oncology | Admitting: Radiation Oncology

## 2021-08-06 DIAGNOSIS — C3411 Malignant neoplasm of upper lobe, right bronchus or lung: Secondary | ICD-10-CM | POA: Insufficient documentation

## 2021-08-07 DIAGNOSIS — M6281 Muscle weakness (generalized): Secondary | ICD-10-CM | POA: Diagnosis not present

## 2021-08-12 ENCOUNTER — Inpatient Hospital Stay: Payer: Medicare HMO | Attending: Internal Medicine | Admitting: Hospice and Palliative Medicine

## 2021-08-12 ENCOUNTER — Ambulatory Visit
Admission: RE | Admit: 2021-08-12 | Discharge: 2021-08-12 | Disposition: A | Discharge status: Home / Self Care | Payer: Medicare HMO | Source: Ambulatory Visit | Attending: Radiation Oncology | Admitting: Radiation Oncology

## 2021-08-12 DIAGNOSIS — C3411 Malignant neoplasm of upper lobe, right bronchus or lung: Secondary | ICD-10-CM | POA: Insufficient documentation

## 2021-08-12 DIAGNOSIS — M6281 Muscle weakness (generalized): Secondary | ICD-10-CM | POA: Diagnosis not present

## 2021-08-12 NOTE — Progress Notes (Signed)
Multidisciplinary Oncology Council Documentation  Yoali Marguerite Barba was presented by our [facility] on 08/12/2021, which included representatives from:  Palliative Care Dietitian  Physical/Occupational Therapist Nurse Navigator Genetics Speech Therapist Social work Survivorship Psychologist, counselling   Melena currently presents with history of lung cancer  We reviewed previous medical and familial history, history of present illness, and recent lab results along with all available histopathologic and imaging studies. The [place] considered available treatment options and made the following recommendations/referrals:  none  The MOC is a meeting of clinicians from various specialty areas who evaluate and discuss patients for whom a multidisciplinary approach is being considered. Final determinations in the plan of care are those of the provider(s).   Today's extended care, comprehensive team conference, Jazzie was not present for the discussion and was not examined.

## 2021-08-18 ENCOUNTER — Encounter: Payer: Self-pay | Admitting: *Deleted

## 2021-08-18 ENCOUNTER — Other Ambulatory Visit: Payer: Self-pay

## 2021-08-18 DIAGNOSIS — C3411 Malignant neoplasm of upper lobe, right bronchus or lung: Secondary | ICD-10-CM | POA: Diagnosis not present

## 2021-08-18 LAB — RAD ONC ARIA SESSION SUMMARY
Course Elapsed Days: 0
Plan Fractions Treated to Date: 1
Plan Prescribed Dose Per Fraction: 12 Gy
Plan Total Fractions Prescribed: 5
Plan Total Prescribed Dose: 60 Gy
Reference Point Dosage Given to Date: 12 Gy
Reference Point Session Dosage Given: 12 Gy
Session Number: 1

## 2021-08-19 DIAGNOSIS — M6281 Muscle weakness (generalized): Secondary | ICD-10-CM | POA: Diagnosis not present

## 2021-08-20 ENCOUNTER — Other Ambulatory Visit: Payer: Self-pay

## 2021-08-20 ENCOUNTER — Ambulatory Visit
Admission: RE | Admit: 2021-08-20 | Discharge: 2021-08-20 | Disposition: A | Discharge status: Home / Self Care | Payer: Medicare HMO | Source: Ambulatory Visit | Attending: Radiation Oncology | Admitting: Radiation Oncology

## 2021-08-20 DIAGNOSIS — C3411 Malignant neoplasm of upper lobe, right bronchus or lung: Secondary | ICD-10-CM | POA: Diagnosis not present

## 2021-08-20 LAB — RAD ONC ARIA SESSION SUMMARY
Course Elapsed Days: 2
Plan Fractions Treated to Date: 2
Plan Prescribed Dose Per Fraction: 12 Gy
Plan Total Fractions Prescribed: 5
Plan Total Prescribed Dose: 60 Gy
Reference Point Dosage Given to Date: 24 Gy
Reference Point Session Dosage Given: 12 Gy
Session Number: 2

## 2021-08-24 ENCOUNTER — Other Ambulatory Visit: Payer: Self-pay

## 2021-08-24 ENCOUNTER — Ambulatory Visit
Admission: RE | Admit: 2021-08-24 | Discharge: 2021-08-24 | Disposition: A | Discharge status: Home / Self Care | Payer: Medicare HMO | Source: Ambulatory Visit | Attending: Radiation Oncology | Admitting: Radiation Oncology

## 2021-08-24 DIAGNOSIS — C3411 Malignant neoplasm of upper lobe, right bronchus or lung: Secondary | ICD-10-CM | POA: Diagnosis not present

## 2021-08-24 LAB — RAD ONC ARIA SESSION SUMMARY
Course Elapsed Days: 6
Plan Fractions Treated to Date: 3
Plan Prescribed Dose Per Fraction: 12 Gy
Plan Total Fractions Prescribed: 5
Plan Total Prescribed Dose: 60 Gy
Reference Point Dosage Given to Date: 36 Gy
Reference Point Session Dosage Given: 12 Gy
Session Number: 3

## 2021-08-26 ENCOUNTER — Ambulatory Visit
Admission: RE | Admit: 2021-08-26 | Discharge: 2021-08-26 | Disposition: A | Discharge status: Home / Self Care | Payer: Medicare HMO | Source: Ambulatory Visit | Attending: Radiation Oncology | Admitting: Radiation Oncology

## 2021-08-26 ENCOUNTER — Other Ambulatory Visit: Payer: Self-pay

## 2021-08-26 DIAGNOSIS — C3411 Malignant neoplasm of upper lobe, right bronchus or lung: Secondary | ICD-10-CM | POA: Diagnosis not present

## 2021-08-26 LAB — RAD ONC ARIA SESSION SUMMARY
Course Elapsed Days: 8
Plan Fractions Treated to Date: 4
Plan Prescribed Dose Per Fraction: 12 Gy
Plan Total Fractions Prescribed: 5
Plan Total Prescribed Dose: 60 Gy
Reference Point Dosage Given to Date: 48 Gy
Reference Point Session Dosage Given: 12 Gy
Session Number: 4

## 2021-08-31 ENCOUNTER — Other Ambulatory Visit: Payer: Self-pay

## 2021-08-31 ENCOUNTER — Ambulatory Visit
Admission: RE | Admit: 2021-08-31 | Discharge: 2021-08-31 | Disposition: A | Discharge status: Home / Self Care | Payer: Medicare HMO | Source: Ambulatory Visit | Attending: Radiation Oncology | Admitting: Radiation Oncology

## 2021-08-31 DIAGNOSIS — C3411 Malignant neoplasm of upper lobe, right bronchus or lung: Secondary | ICD-10-CM | POA: Diagnosis not present

## 2021-08-31 LAB — RAD ONC ARIA SESSION SUMMARY
Course Elapsed Days: 13
Plan Fractions Treated to Date: 5
Plan Prescribed Dose Per Fraction: 12 Gy
Plan Total Fractions Prescribed: 5
Plan Total Prescribed Dose: 60 Gy
Reference Point Dosage Given to Date: 60 Gy
Reference Point Session Dosage Given: 12 Gy
Session Number: 5

## 2021-09-21 DIAGNOSIS — R918 Other nonspecific abnormal finding of lung field: Secondary | ICD-10-CM | POA: Diagnosis not present

## 2021-09-21 DIAGNOSIS — J449 Chronic obstructive pulmonary disease, unspecified: Secondary | ICD-10-CM | POA: Diagnosis not present

## 2021-10-05 ENCOUNTER — Encounter: Payer: Self-pay | Admitting: Radiation Oncology

## 2021-10-05 ENCOUNTER — Ambulatory Visit
Admission: RE | Admit: 2021-10-05 | Discharge: 2021-10-05 | Disposition: A | Discharge status: Home / Self Care | Payer: Medicare HMO | Source: Ambulatory Visit | Attending: Radiation Oncology | Admitting: Radiation Oncology

## 2021-10-05 ENCOUNTER — Other Ambulatory Visit: Payer: Self-pay | Admitting: *Deleted

## 2021-10-05 VITALS — BP 118/59 | HR 52 | Temp 98.6°F | Resp 20 | Ht 65.0 in | Wt 207.0 lb

## 2021-10-05 DIAGNOSIS — C3411 Malignant neoplasm of upper lobe, right bronchus or lung: Secondary | ICD-10-CM

## 2021-10-05 NOTE — Progress Notes (Signed)
Radiation Oncology Follow up Note  Name: Annette Quinn   Date:   10/05/2021 MRN:  [id number] DOB: [birth year]    This 71 y.o. female presents to the clinic today for 1 month follow-up status post SBRT to her right upper lobe for stage I squamous cell carcinoma.  REFERRING PROVIDER: Kirk Ruths, MD  HPI: Patient is a 71 year old female now at 1 month having completed SBRT to her right upper lobe for stage I squamous cell carcinoma.  Seen today in routine follow-up she is doing well specifically denies cough hemoptysis chest tightness or any change in her pulmonary status..  COMPLICATIONS OF TREATMENT: none  FOLLOW UP COMPLIANCE: keeps appointments   PHYSICAL EXAM:  BP (!) 118/59 (BP Location: Right Wrist, Patient Position: Sitting, Cuff Size: Small)   Pulse (!) 52   Temp 98.6 F (37 C) (Tympanic)   Resp 20   Ht 5\' 5"  (1.651 m) Comment: stated wt  Wt 207 lb (93.9 kg)   BMI 34.45 kg/m  Well-developed well-nourished patient in NAD. HEENT reveals PERLA, EOMI, discs not visualized.  Oral cavity is clear. No oral mucosal lesions are identified. Neck is clear without evidence of cervical or supraclavicular adenopathy. Lungs are clear to A&P. Cardiac examination is essentially unremarkable with regular rate and rhythm without murmur rub or thrill. Abdomen is benign with no organomegaly or masses noted. Motor sensory and DTR levels are equal and symmetric in the upper and lower extremities. Cranial nerves II through XII are grossly intact. Proprioception is intact. No peripheral adenopathy or edema is identified. No motor or sensory levels are noted. Crude visual fields are within normal range.  RADIOLOGY RESULTS: CT scan of the chest ordered  PLAN: Present time patient is doing well with extremely low side effect profile from her SBRT.  And pleased with her overall progress.  Of asked to see her back in 3 months for follow-up with a repeat CT scan of her chest prior to that visit.   Patient knows to call with any concerns.  She is doing extremely well.  I would like to take this opportunity to thank you for allowing me to participate in the care of your patient.Noreene Filbert, MD

## 2021-10-08 DIAGNOSIS — Z03818 Encounter for observation for suspected exposure to other biological agents ruled out: Secondary | ICD-10-CM | POA: Diagnosis not present

## 2021-10-08 DIAGNOSIS — J45909 Unspecified asthma, uncomplicated: Secondary | ICD-10-CM | POA: Diagnosis not present

## 2021-10-23 DIAGNOSIS — M1711 Unilateral primary osteoarthritis, right knee: Secondary | ICD-10-CM | POA: Diagnosis not present

## 2021-10-30 DIAGNOSIS — M1711 Unilateral primary osteoarthritis, right knee: Secondary | ICD-10-CM | POA: Diagnosis not present

## 2021-11-06 DIAGNOSIS — G8929 Other chronic pain: Secondary | ICD-10-CM | POA: Diagnosis not present

## 2021-11-06 DIAGNOSIS — M1711 Unilateral primary osteoarthritis, right knee: Secondary | ICD-10-CM | POA: Diagnosis not present

## 2021-11-06 DIAGNOSIS — M25561 Pain in right knee: Secondary | ICD-10-CM | POA: Diagnosis not present

## 2021-12-10 ENCOUNTER — Other Ambulatory Visit: Payer: Self-pay | Admitting: Internal Medicine

## 2021-12-10 DIAGNOSIS — Z1231 Encounter for screening mammogram for malignant neoplasm of breast: Secondary | ICD-10-CM

## 2021-12-30 ENCOUNTER — Ambulatory Visit
Admission: RE | Admit: 2021-12-30 | Discharge: 2021-12-30 | Disposition: A | Discharge status: Home / Self Care | Payer: Medicare HMO | Source: Ambulatory Visit | Attending: Radiation Oncology | Admitting: Radiation Oncology

## 2021-12-30 DIAGNOSIS — C3411 Malignant neoplasm of upper lobe, right bronchus or lung: Secondary | ICD-10-CM | POA: Diagnosis not present

## 2021-12-30 DIAGNOSIS — C3490 Malignant neoplasm of unspecified part of unspecified bronchus or lung: Secondary | ICD-10-CM | POA: Diagnosis not present

## 2021-12-30 DIAGNOSIS — J439 Emphysema, unspecified: Secondary | ICD-10-CM | POA: Diagnosis not present

## 2021-12-30 DIAGNOSIS — R911 Solitary pulmonary nodule: Secondary | ICD-10-CM | POA: Diagnosis not present

## 2021-12-30 LAB — POCT I-STAT CREATININE: Creatinine, Ser: 1.1 mg/dL — ABNORMAL HIGH (ref 0.44–1.00)

## 2021-12-30 MED ORDER — IOHEXOL 300 MG/ML  SOLN
75.0000 mL | Freq: Once | INTRAMUSCULAR | Status: AC | PRN
  Administered 2021-12-30: 75 mL via INTRAVENOUS

## 2022-01-06 ENCOUNTER — Encounter: Payer: Self-pay | Admitting: Radiation Oncology

## 2022-01-06 ENCOUNTER — Ambulatory Visit
Admission: RE | Admit: 2022-01-06 | Discharge: 2022-01-06 | Disposition: A | Discharge status: Home / Self Care | Payer: Medicare HMO | Source: Ambulatory Visit | Attending: Radiation Oncology | Admitting: Radiation Oncology

## 2022-01-06 ENCOUNTER — Other Ambulatory Visit: Payer: Self-pay | Admitting: *Deleted

## 2022-01-06 VITALS — BP 109/66 | HR 52 | Temp 98.0°F | Resp 20 | Ht 65.0 in | Wt 212.0 lb

## 2022-01-06 DIAGNOSIS — Z923 Personal history of irradiation: Secondary | ICD-10-CM | POA: Diagnosis not present

## 2022-01-06 DIAGNOSIS — C3411 Malignant neoplasm of upper lobe, right bronchus or lung: Secondary | ICD-10-CM

## 2022-01-06 DIAGNOSIS — Z87891 Personal history of nicotine dependence: Secondary | ICD-10-CM | POA: Diagnosis not present

## 2022-01-06 NOTE — Progress Notes (Signed)
Radiation Oncology Follow up Note  Name: Annette Quinn   Date:   01/06/2022 MRN:  [id number] DOB: 1951/02/06    This 71 y.o. female presents to the clinic today for 34-month follow-up status post SBRT to right upper lobe for stage I squamous cell carcinoma.  REFERRING PROVIDER: Kirk Ruths, MD  HPI: Patient is a 71 year old female now out for months having completed SBRT to her right upper lobe for a stage I squamous of carcinoma seen today in routine follow-up should is quite fatigued.  Has some hair loss from a pulmonary standpoint she specifically denies cough any change in pulmonary status or dysphagia.  She had a recent CT scan showing evolving posttreatment changes in the right upper lobe without signs of progression.  The minimal fullness and right hilar node seems to be slightly improved.  COMPLICATIONS OF TREATMENT: none  FOLLOW UP COMPLIANCE: keeps appointments   PHYSICAL EXAM:  BP 109/66 (BP Location: Right Wrist, Patient Position: Sitting, Cuff Size: Small)   Pulse (!) 52   Temp 98 F (36.7 C) (Tympanic)   Resp 20   Ht 5\' 5"  (1.651 m)   Wt 212 lb (96.2 kg)   BMI 35.28 kg/m  Well-developed well-nourished patient in NAD. HEENT reveals PERLA, EOMI, discs not visualized.  Oral cavity is clear. No oral mucosal lesions are identified. Neck is clear without evidence of cervical or supraclavicular adenopathy. Lungs are clear to A&P. Cardiac examination is essentially unremarkable with regular rate and rhythm without murmur rub or thrill. Abdomen is benign with no organomegaly or masses noted. Motor sensory and DTR levels are equal and symmetric in the upper and lower extremities. Cranial nerves II through XII are grossly intact. Proprioception is intact. No peripheral adenopathy or edema is identified. No motor or sensory levels are noted. Crude visual fields are within normal range.  RADIOLOGY RESULTS: CT scan reviewed compatible with above-stated findings.  PLAN:  Present time patient is doing well low side effect profile from SBRT with CT's findings indicative of response.  I have asked to see her back in 6 months for follow-up with a repeat CT scan at that time.  I have asked her to follow-up with her PMD about her hair loss and overall fatigue.  Patient is to call with any concerns.  I would like to take this opportunity to thank you for allowing me to participate in the care of your patient.Noreene Filbert, MD

## 2022-01-20 ENCOUNTER — Ambulatory Visit
Admission: RE | Admit: 2022-01-20 | Discharge: 2022-01-20 | Disposition: A | Discharge status: Home / Self Care | Payer: Medicare HMO | Source: Ambulatory Visit | Attending: Internal Medicine | Admitting: Internal Medicine

## 2022-01-20 DIAGNOSIS — Z1231 Encounter for screening mammogram for malignant neoplasm of breast: Secondary | ICD-10-CM | POA: Insufficient documentation

## 2022-01-21 DIAGNOSIS — J449 Chronic obstructive pulmonary disease, unspecified: Secondary | ICD-10-CM | POA: Diagnosis not present

## 2022-01-28 DIAGNOSIS — Z85828 Personal history of other malignant neoplasm of skin: Secondary | ICD-10-CM | POA: Diagnosis not present

## 2022-01-28 DIAGNOSIS — L570 Actinic keratosis: Secondary | ICD-10-CM | POA: Diagnosis not present

## 2022-01-28 DIAGNOSIS — E781 Pure hyperglyceridemia: Secondary | ICD-10-CM | POA: Diagnosis not present

## 2022-01-28 DIAGNOSIS — L821 Other seborrheic keratosis: Secondary | ICD-10-CM | POA: Diagnosis not present

## 2022-01-28 DIAGNOSIS — R7303 Prediabetes: Secondary | ICD-10-CM | POA: Diagnosis not present

## 2022-01-28 DIAGNOSIS — L814 Other melanin hyperpigmentation: Secondary | ICD-10-CM | POA: Diagnosis not present

## 2022-01-28 DIAGNOSIS — D1801 Hemangioma of skin and subcutaneous tissue: Secondary | ICD-10-CM | POA: Diagnosis not present

## 2022-02-04 DIAGNOSIS — R7303 Prediabetes: Secondary | ICD-10-CM | POA: Diagnosis not present

## 2022-02-04 DIAGNOSIS — N1831 Chronic kidney disease, stage 3a: Secondary | ICD-10-CM | POA: Diagnosis not present

## 2022-02-04 DIAGNOSIS — F325 Major depressive disorder, single episode, in full remission: Secondary | ICD-10-CM | POA: Diagnosis not present

## 2022-02-05 DIAGNOSIS — M1711 Unilateral primary osteoarthritis, right knee: Secondary | ICD-10-CM | POA: Diagnosis not present

## 2022-02-05 DIAGNOSIS — M25562 Pain in left knee: Secondary | ICD-10-CM | POA: Diagnosis not present

## 2022-04-28 ENCOUNTER — Other Ambulatory Visit
Admission: RE | Admit: 2022-04-28 | Discharge: 2022-04-28 | Disposition: A | Discharge status: Home / Self Care | Payer: Medicare Other | Source: Ambulatory Visit | Attending: Internal Medicine | Admitting: Internal Medicine

## 2022-04-28 DIAGNOSIS — R0789 Other chest pain: Secondary | ICD-10-CM | POA: Insufficient documentation

## 2022-04-28 LAB — D-DIMER, QUANTITATIVE: D-Dimer, Quant: 0.88 ug/mL-FEU — ABNORMAL HIGH (ref 0.00–0.50)

## 2022-04-29 ENCOUNTER — Other Ambulatory Visit: Payer: Self-pay | Admitting: Internal Medicine

## 2022-04-29 ENCOUNTER — Ambulatory Visit
Admission: RE | Admit: 2022-04-29 | Discharge: 2022-04-29 | Disposition: A | Discharge status: Home / Self Care | Payer: Medicare Other | Source: Ambulatory Visit | Attending: Internal Medicine | Admitting: Internal Medicine

## 2022-04-29 DIAGNOSIS — R071 Chest pain on breathing: Secondary | ICD-10-CM

## 2022-04-29 DIAGNOSIS — R7989 Other specified abnormal findings of blood chemistry: Secondary | ICD-10-CM | POA: Insufficient documentation

## 2022-04-29 MED ORDER — IOHEXOL 350 MG/ML SOLN
75.0000 mL | Freq: Once | INTRAVENOUS | Status: AC | PRN
  Administered 2022-04-29: 75 mL via INTRAVENOUS

## 2022-07-02 ENCOUNTER — Ambulatory Visit
Admission: RE | Admit: 2022-07-02 | Discharge: 2022-07-02 | Disposition: A | Discharge status: Home / Self Care | Payer: Medicare Other | Source: Ambulatory Visit | Attending: Radiation Oncology | Admitting: Radiation Oncology

## 2022-07-02 DIAGNOSIS — C3411 Malignant neoplasm of upper lobe, right bronchus or lung: Secondary | ICD-10-CM | POA: Diagnosis present

## 2022-07-02 MED ORDER — IOHEXOL 300 MG/ML  SOLN
75.0000 mL | Freq: Once | INTRAMUSCULAR | Status: AC | PRN
  Administered 2022-07-02: 75 mL via INTRAVENOUS

## 2022-07-14 ENCOUNTER — Encounter: Payer: Self-pay | Admitting: Radiation Oncology

## 2022-07-14 ENCOUNTER — Ambulatory Visit
Admission: RE | Admit: 2022-07-14 | Discharge: 2022-07-14 | Disposition: A | Discharge status: Home / Self Care | Payer: Medicare Other | Source: Ambulatory Visit | Attending: Radiation Oncology | Admitting: Radiation Oncology

## 2022-07-14 ENCOUNTER — Other Ambulatory Visit: Payer: Self-pay | Admitting: *Deleted

## 2022-07-14 VITALS — BP 131/61 | HR 46 | Temp 98.0°F | Resp 20 | Wt 202.0 lb

## 2022-07-14 DIAGNOSIS — C3411 Malignant neoplasm of upper lobe, right bronchus or lung: Secondary | ICD-10-CM | POA: Insufficient documentation

## 2022-07-14 DIAGNOSIS — Z923 Personal history of irradiation: Secondary | ICD-10-CM | POA: Diagnosis not present

## 2022-07-14 NOTE — Progress Notes (Signed)
Radiation Oncology Follow up Note  Name: Annette Quinn   Date:   07/14/2022 MRN:  [id number] DOB: 06-10-1950    This 72 y.o. female presents to the clinic today for 4-month follow-up status post SBRT to right upper lobe for stage I squamous of carcinoma.  REFERRING PROVIDER: Lauro Regulus, MD  HPI: Patient is a 72 year old female now out 10 months having completed SBRT to her right upper lobe for stage I squamous cell carcinoma.  Seen today in routine follow-up she is doing well specifically denies significant significant cough hemoptysis or chest tightness.  Had a recent CT scan.  Which I have reviewed showing scarring in the right upper lobe superior segment the right lower consistent with radiation treatment.  COMPLICATIONS OF TREATMENT: none  FOLLOW UP COMPLIANCE: keeps appointments   PHYSICAL EXAM:  BP 131/61   Pulse (!) 46   Temp 98 F (36.7 C) (Tympanic)   Resp 20   Wt 202 lb (91.6 kg)   BMI 33.61 kg/m  Well-developed well-nourished patient in NAD. HEENT reveals PERLA, EOMI, discs not visualized.  Oral cavity is clear. No oral mucosal lesions are identified. Neck is clear without evidence of cervical or supraclavicular adenopathy. Lungs are clear to A&P. Cardiac examination is essentially unremarkable with regular rate and rhythm without murmur rub or thrill. Abdomen is benign with no organomegaly or masses noted. Motor sensory and DTR levels are equal and symmetric in the upper and lower extremities. Cranial nerves II through XII are grossly intact. Proprioception is intact. No peripheral adenopathy or edema is identified. No motor or sensory levels are noted. Crude visual fields are within normal range.  RADIOLOGY RESULTS: CT scans reviewed compatible with above-stated findings  PLAN: Present time patient is doing well.  Her CT findings are consistent with radiation changes.  I have asked to see her back in 6 months with a follow-up CT scan.  Patient knows to call  with any concerns at any time.  I would like to take this opportunity to thank you for allowing me to participate in the care of your patient.Carmina Miller, MD

## 2022-11-09 ENCOUNTER — Other Ambulatory Visit: Payer: Self-pay | Admitting: Physician Assistant

## 2022-11-09 ENCOUNTER — Ambulatory Visit
Admission: RE | Admit: 2022-11-09 | Discharge: 2022-11-09 | Disposition: A | Discharge status: Home / Self Care | Payer: Medicare Other | Source: Ambulatory Visit | Attending: Physician Assistant | Admitting: Physician Assistant

## 2022-11-09 DIAGNOSIS — M79661 Pain in right lower leg: Secondary | ICD-10-CM

## 2022-11-09 DIAGNOSIS — M7989 Other specified soft tissue disorders: Secondary | ICD-10-CM | POA: Diagnosis present

## 2023-01-14 ENCOUNTER — Ambulatory Visit
Admission: RE | Admit: 2023-01-14 | Discharge: 2023-01-14 | Disposition: A | Discharge status: Home / Self Care | Payer: Medicare Other | Source: Ambulatory Visit | Attending: Radiation Oncology | Admitting: Radiation Oncology

## 2023-01-14 DIAGNOSIS — C3411 Malignant neoplasm of upper lobe, right bronchus or lung: Secondary | ICD-10-CM | POA: Insufficient documentation

## 2023-01-14 MED ORDER — IOHEXOL 300 MG/ML  SOLN
75.0000 mL | Freq: Once | INTRAMUSCULAR | Status: AC | PRN
  Administered 2023-01-14: 75 mL via INTRAVENOUS

## 2023-01-17 ENCOUNTER — Other Ambulatory Visit: Payer: Self-pay | Admitting: Internal Medicine

## 2023-01-17 DIAGNOSIS — Z1231 Encounter for screening mammogram for malignant neoplasm of breast: Secondary | ICD-10-CM

## 2023-01-20 ENCOUNTER — Ambulatory Visit: Payer: Medicare Other | Admitting: Radiation Oncology

## 2023-02-23 ENCOUNTER — Ambulatory Visit
Admission: RE | Admit: 2023-02-23 | Discharge: 2023-02-23 | Disposition: A | Discharge status: Home / Self Care | Payer: Medicare Other | Source: Ambulatory Visit | Attending: Radiation Oncology | Admitting: Radiation Oncology

## 2023-02-23 ENCOUNTER — Encounter: Payer: Self-pay | Admitting: Radiation Oncology

## 2023-02-23 ENCOUNTER — Other Ambulatory Visit: Payer: Self-pay | Admitting: *Deleted

## 2023-02-23 VITALS — BP 129/64 | HR 50 | Temp 98.0°F | Resp 16 | Ht 65.0 in | Wt 200.0 lb

## 2023-02-23 DIAGNOSIS — C3411 Malignant neoplasm of upper lobe, right bronchus or lung: Secondary | ICD-10-CM

## 2023-02-23 DIAGNOSIS — Z923 Personal history of irradiation: Secondary | ICD-10-CM | POA: Insufficient documentation

## 2023-02-23 NOTE — Progress Notes (Signed)
 Radiation Oncology Follow up Note  Name: Annette Quinn   Date:   02/23/2023 MRN:  [id number] DOB: 09/14/1950    This 73 y.o. female presents to the clinic today for 1-1/2-year follow-up status post SBRT to her right upper lobe for stage I squamous cell carcinoma.  REFERRING PROVIDER: Jimmy Moulding, MD  HPI: Patient is a 73 year old female now out 1 and half year follow-up status post SBRT to her right upper lobe for stage I squamous cell carcinoma.  Seen today in routine follow-up she is doing well specifically [place] cough any change in her pulmonary status any dysphagia.  Recent CT scan showed postradiation scarring the right hemithorax no evidence of recurrent or metastatic disease she has a small area of subpleural consolidation anterior left upper lobe which is similar over time.  COMPLICATIONS OF TREATMENT: none  FOLLOW UP COMPLIANCE: keeps appointments   PHYSICAL EXAM:  BP 129/64   Pulse (!) 50   Temp 98 F (36.7 C) (Tympanic)   Resp 16   Ht 5\' 5"  (1.651 m)   Wt 200 lb (90.7 kg)   BMI 33.28 kg/m  Elderly female in NAD.  Well-developed well-nourished patient in NAD. HEENT reveals PERLA, EOMI, discs not visualized.  Oral cavity is clear. No oral mucosal lesions are identified. Neck is clear without evidence of cervical or supraclavicular adenopathy. Lungs are clear to A&P. Cardiac examination is essentially unremarkable with regular rate and rhythm without murmur rub or thrill. Abdomen is benign with no organomegaly or masses noted. Motor sensory and DTR levels are equal and symmetric in the upper and lower extremities. Cranial nerves II through XII are grossly intact. Proprioception is intact. No peripheral adenopathy or edema is identified. No motor or sensory levels are noted. Crude visual fields are within normal range.  RADIOLOGY RESULTS: CT scans reviewed compatible with above-stated findings  PLAN: Present time patient is doing well with stage I squamous cell  carcinoma to her excellent control with SBRT now out a year and a half.  On pleased with her overall progress.  Of asked to see her back in 6 months and then we will start once year follow-up visits.  Patient knows to call with any concerns.  I would like to take this opportunity to thank you for allowing me to participate in the care of your patient.Glenis Langdon, MD

## 2023-09-08 ENCOUNTER — Ambulatory Visit
Admission: RE | Admit: 2023-09-08 | Discharge: 2023-09-08 | Disposition: A | Discharge status: Home / Self Care | Payer: Medicare Other | Source: Ambulatory Visit | Attending: Radiation Oncology | Admitting: Radiation Oncology

## 2023-09-08 DIAGNOSIS — C3411 Malignant neoplasm of upper lobe, right bronchus or lung: Secondary | ICD-10-CM | POA: Diagnosis present

## 2023-09-08 MED ORDER — IOHEXOL 300 MG/ML  SOLN
75.0000 mL | Freq: Once | INTRAMUSCULAR | Status: AC | PRN
  Administered 2023-09-08: 75 mL via INTRAVENOUS

## 2023-09-14 ENCOUNTER — Other Ambulatory Visit: Payer: Self-pay | Admitting: *Deleted

## 2023-09-14 ENCOUNTER — Encounter: Payer: Self-pay | Admitting: Radiation Oncology

## 2023-09-14 ENCOUNTER — Ambulatory Visit
Admission: RE | Admit: 2023-09-14 | Discharge: 2023-09-14 | Disposition: A | Discharge status: Home / Self Care | Payer: Medicare Other | Source: Ambulatory Visit | Attending: Radiation Oncology | Admitting: Radiation Oncology

## 2023-09-14 VITALS — BP 114/70 | HR 55 | Temp 98.6°F | Resp 18 | Wt 184.7 lb

## 2023-09-14 DIAGNOSIS — C3411 Malignant neoplasm of upper lobe, right bronchus or lung: Secondary | ICD-10-CM

## 2023-09-14 DIAGNOSIS — R0781 Pleurodynia: Secondary | ICD-10-CM | POA: Insufficient documentation

## 2023-09-14 DIAGNOSIS — Z923 Personal history of irradiation: Secondary | ICD-10-CM | POA: Diagnosis not present

## 2023-09-14 NOTE — Progress Notes (Signed)
 Radiation Oncology Follow up Note  Name: Annette Quinn   Date:   09/14/2023 MRN:  [id number] DOB: 21-Jun-1950    This 73 y.o. female presents to the clinic today for close to 2-year follow-up status post SBRT to her right upper lobe for stage I squamous cell carcinoma.  REFERRING PROVIDER: Lenon Layman ORN, MD  HPI: Patient is a 73 year old female now out close to 2 years having completed SBRT to her right upper lobe for stage I squamous of carcinoma seen today in routine follow-up from a pulmonary standpoint she is stable specifically denies cough hemoptysis or chest tightness.  She does have some slight right rib pain..  Patient does use a Combivent inhaler as well as oxygen  set at 2 L at home.  She had a recent CT scan of her chest showing redemonstration of heterogeneous opacity in the posterior segment the right upper lobe with associated bronchiectatic changes.  No significant interval change in the size of the opacity over time is noted.  There is a new what is reported as an expansile lytic lesion of the right posterior sixth rib and close proximity patient's known right upper lobe mass.  Also there is a irregular sclerotic lesion in the right side of the manubrium sternum concerning for metastatic disease.  She is having no sternal pain.  COMPLICATIONS OF TREATMENT: none  FOLLOW UP COMPLIANCE: keeps appointments   PHYSICAL EXAM:  BP 114/70 (BP Location: Right Wrist, Patient Position: Sitting)   Pulse (!) 55   Temp 98.6 F (37 C) (Tympanic)   Resp 18   Wt 184 lb 11.2 oz (83.8 kg)   SpO2 98%   BMI 30.74 kg/m  Frail-appearing female in NAD.  Well-developed well-nourished patient in NAD. HEENT reveals PERLA, EOMI, discs not visualized.  Oral cavity is clear. No oral mucosal lesions are identified. Neck is clear without evidence of cervical or supraclavicular adenopathy. Lungs are clear to A&P. Cardiac examination is essentially unremarkable with regular rate and rhythm without  murmur rub or thrill. Abdomen is benign with no organomegaly or masses noted. Motor sensory and DTR levels are equal and symmetric in the upper and lower extremities. Cranial nerves II through XII are grossly intact. Proprioception is intact. No peripheral adenopathy or edema is identified. No motor or sensory levels are noted. Crude visual fields are within normal range.  RADIOLOGY RESULTS: CT scan reviewed PET CT scan ordered  PLAN: Present time of ordered a PET CT scan to better delineate possible metastatic disease in this patient now close to 2 years out from SBRT for stage I right upper lobe lung cancer.  Will see her back in follow-up right after PET CT scan with further recommendations.  Should she have evidence of metastatic disease we will refer to medical oncology as well as consideration of of palliative radiation therapy to sites of metastatic disease.  Patient is well aware of my recommendations.  I would like to take this opportunity to thank you for allowing me to participate in the care of your patient.SABRA Marcey Penton, MD

## 2023-09-26 ENCOUNTER — Other Ambulatory Visit

## 2023-09-27 ENCOUNTER — Other Ambulatory Visit: Payer: Self-pay | Admitting: Internal Medicine

## 2023-09-27 DIAGNOSIS — Z1231 Encounter for screening mammogram for malignant neoplasm of breast: Secondary | ICD-10-CM

## 2023-09-28 ENCOUNTER — Ambulatory Visit
Admission: RE | Admit: 2023-09-28 | Discharge: 2023-09-28 | Disposition: A | Discharge status: Home / Self Care | Source: Ambulatory Visit | Attending: Radiation Oncology | Admitting: Radiation Oncology

## 2023-09-28 DIAGNOSIS — C3411 Malignant neoplasm of upper lobe, right bronchus or lung: Secondary | ICD-10-CM | POA: Insufficient documentation

## 2023-09-28 LAB — GLUCOSE, CAPILLARY: Glucose-Capillary: 103 mg/dL — ABNORMAL HIGH (ref 70–99)

## 2023-09-28 MED ORDER — FLUDEOXYGLUCOSE F - 18 (FDG) INJECTION
9.5000 | Freq: Once | INTRAVENOUS | Status: AC | PRN
  Administered 2023-09-28: 10.27 via INTRAVENOUS

## 2023-10-03 ENCOUNTER — Ambulatory Visit

## 2023-10-03 ENCOUNTER — Other Ambulatory Visit: Payer: Self-pay | Admitting: *Deleted

## 2023-10-03 DIAGNOSIS — C3411 Malignant neoplasm of upper lobe, right bronchus or lung: Secondary | ICD-10-CM

## 2023-10-04 ENCOUNTER — Ambulatory Visit

## 2023-10-11 NOTE — Progress Notes (Signed)
 Philip Cornet, MD sent to Annette Quinn S PROCEDURE / BIOPSY REVIEW Date: 10/07/23  Requested Biopsy site: Right posterior chest Reason for request: Positive PET CT Imaging review: Best seen on PET CT image 56, seq 607.  Soft tissue between scapula and right rib.  Decision: Approved Imaging modality to perform: CT Schedule with: Moderate Sedation Schedule for: Any VIR  Additional comments: Discussed with Dr. Lenn  Please contact me with questions, concerns, or if issue pertaining to this request arise.  [name] Philip, MD [facility]

## 2023-10-11 NOTE — Progress Notes (Signed)
 Hughes Simmonds, MD sent to Carlie Clarita RAMAN Cc: Lenn Aran, MD PROCEDURE / BIOPSY REVIEW Date: 10/11/23  Requested Biopsy site: R posterior chest wall Reason for request: Hypermetabolic. Hx of lung CA Imaging review: Best seen on PET  Decision: Approved Imaging modality to perform: CT Schedule with: Moderate Sedation Schedule for: Any VIR  Additional comments: @VIR : HM ST between R scapula and ribs w fx. No discrete lesion @Schedulers . CT guide R posterior chest wall lesion. Mod sed  Please contact me with questions, concerns, or if issue pertaining to this request arise.  Simmonds Hughes, MD [facility] Rad[facility]

## 2023-10-19 ENCOUNTER — Other Ambulatory Visit (HOSPITAL_COMMUNITY): Payer: Self-pay | Admitting: Student

## 2023-10-19 DIAGNOSIS — C3411 Malignant neoplasm of upper lobe, right bronchus or lung: Secondary | ICD-10-CM
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
## 2023-10-19 NOTE — Progress Notes (Signed)
 Patient for CT guided RT posterior chest wall lesion biopsy on Thurs 10/20/23, I called and spoke with the patient on the phone and gave pre-procedure instructions. Pt was made aware to be here at 10a, NPO after MN prior to procedure as well as driver post procedure/recovery/discharge. Pt stated understanding. Called 10/19/23

## 2023-10-20 ENCOUNTER — Ambulatory Visit
Admission: RE | Admit: 2023-10-20 | Discharge: 2023-10-20 | Disposition: A | Discharge status: Home / Self Care | Source: Ambulatory Visit | Attending: Radiation Oncology | Admitting: Radiation Oncology

## 2023-10-20 ENCOUNTER — Other Ambulatory Visit: Payer: Self-pay

## 2023-10-20 DIAGNOSIS — F419 Anxiety disorder, unspecified: Secondary | ICD-10-CM | POA: Insufficient documentation

## 2023-10-20 DIAGNOSIS — Z923 Personal history of irradiation: Secondary | ICD-10-CM | POA: Diagnosis not present

## 2023-10-20 DIAGNOSIS — M799 Soft tissue disorder, unspecified: Secondary | ICD-10-CM | POA: Diagnosis not present

## 2023-10-20 DIAGNOSIS — N189 Chronic kidney disease, unspecified: Secondary | ICD-10-CM | POA: Insufficient documentation

## 2023-10-20 DIAGNOSIS — Z85118 Personal history of other malignant neoplasm of bronchus and lung: Secondary | ICD-10-CM | POA: Diagnosis not present

## 2023-10-20 DIAGNOSIS — Z87891 Personal history of nicotine dependence: Secondary | ICD-10-CM | POA: Insufficient documentation

## 2023-10-20 DIAGNOSIS — F32A Depression, unspecified: Secondary | ICD-10-CM | POA: Insufficient documentation

## 2023-10-20 DIAGNOSIS — C3411 Malignant neoplasm of upper lobe, right bronchus or lung: Secondary | ICD-10-CM | POA: Diagnosis present

## 2023-10-20 DIAGNOSIS — R222 Localized swelling, mass and lump, trunk: Secondary | ICD-10-CM | POA: Diagnosis not present

## 2023-10-20 LAB — CBC
HCT: 37 % (ref 36.0–46.0)
Hemoglobin: 12.4 g/dL (ref 12.0–15.0)
MCH: 29.5 pg (ref 26.0–34.0)
MCHC: 33.5 g/dL (ref 30.0–36.0)
MCV: 87.9 fL (ref 80.0–100.0)
Platelets: 188 K/uL (ref 150–400)
RBC: 4.21 MIL/uL (ref 3.87–5.11)
RDW: 13.6 % (ref 11.5–15.5)
WBC: 5.9 K/uL (ref 4.0–10.5)
nRBC: 0 % (ref 0.0–0.2)

## 2023-10-20 LAB — PROTIME-INR
INR: 1 (ref 0.8–1.2)
Prothrombin Time: 13.6 s (ref 11.4–15.2)

## 2023-10-20 MED ORDER — MIDAZOLAM HCL 2 MG/2ML IJ SOLN
INTRAMUSCULAR | Status: AC | PRN
  Administered 2023-10-20 (×2): 1 mg via INTRAVENOUS

## 2023-10-20 MED ORDER — FENTANYL CITRATE (PF) 100 MCG/2ML IJ SOLN
INTRAMUSCULAR | Status: AC | PRN
  Administered 2023-10-20 (×2): 50 ug via INTRAVENOUS

## 2023-10-20 MED ORDER — LIDOCAINE HCL (PF) 1 % IJ SOLN
10.0000 mL | Freq: Once | INTRAMUSCULAR | Status: AC
Start: 2023-10-20 — End: 2023-10-20
  Administered 2023-10-20: 10 mL
  Filled 2023-10-20: qty 10

## 2023-10-20 MED ORDER — ACETAMINOPHEN 500 MG PO TABS: 500.0000 mg | ORAL_TABLET | ORAL | Status: DC | PRN

## 2023-10-20 MED ORDER — SODIUM CHLORIDE 0.9 % IV SOLN: INTRAVENOUS | Status: DC

## 2023-10-20 MED ORDER — MIDAZOLAM HCL 2 MG/2ML IJ SOLN
INTRAMUSCULAR | Status: AC
  Filled 2023-10-20: qty 2

## 2023-10-20 MED ORDER — FENTANYL CITRATE (PF) 100 MCG/2ML IJ SOLN
INTRAMUSCULAR | Status: AC
  Filled 2023-10-20: qty 2

## 2023-10-20 NOTE — Progress Notes (Signed)
 Patient clinically stable post CT soft tissue biopsy per Dr Philip, tolerated well. Vitals stable pre and post procedure. Vitals stable pre and post procedure. Received Versed  2 mg along with Fentanyl  100 mcg IV for procedure. Report given to Rutha Stacks Rn post procedure/specials/17

## 2023-10-20 NOTE — Progress Notes (Signed)
 Patient without complaints, vitals remain stable. Denies complaints.

## 2023-10-20 NOTE — Procedures (Signed)
 Interventional Radiology Procedure:   Indications: Indeterminate soft tissue in right posterior chest wall   Procedure: CT guided biopsy of right posterior chest wall soft tissue  Findings: Mild soft tissue fullness at area of concern without a discrete lesion.  3 core biopsies obtained.   Complications: None     EBL: Minimal  Plan: Discharge in 1 hour  Arvind Mexicano R. Philip, MD  Pager: [phone]

## 2023-10-24 LAB — SURGICAL PATHOLOGY

## 2023-10-27 ENCOUNTER — Ambulatory Visit
Admission: RE | Admit: 2023-10-27 | Discharge: 2023-10-27 | Disposition: A | Discharge status: Home / Self Care | Source: Ambulatory Visit | Attending: Radiation Oncology | Admitting: Radiation Oncology

## 2023-10-27 ENCOUNTER — Encounter: Payer: Self-pay | Admitting: Radiation Oncology

## 2023-10-27 VITALS — BP 108/40 | HR 56 | Temp 98.0°F | Resp 18 | Ht 65.0 in | Wt 184.0 lb

## 2023-10-27 DIAGNOSIS — C3411 Malignant neoplasm of upper lobe, right bronchus or lung: Secondary | ICD-10-CM | POA: Insufficient documentation

## 2023-10-27 NOTE — Progress Notes (Signed)
 Radiation Oncology Follow up Note  Name: Annette Quinn   Date:   10/27/2023 MRN:  [id number] DOB: 27-Sep-1950    This 73 y.o. female presents to the clinic today for follow-up and report of results in patient now out 2 years having completed SBRT to her right upper lobe for stage I squamous cell carcinoma.  REFERRING PROVIDER: Lenon Layman ORN, MD  HPI: Patient is a 73 year old female now close to 2 years having completed SBRT to right upper lobe for stage I squamous cell carcinoma.  On imaging studies she was found to have what is described an expansile lytic lesion of the right posterior sixth rib and close proximity patient's known previous right upper lobe lung mass.  We did a PET CT scan.  Which showed hypermetabolic activity in the soft tissue between the scapula and the right sixth rib.  There was abnormal appearance of the right posterior sixth rib with adjacent healing fracture and mixed lytic and sclerotic changes.  Findings are likely due to osteonecrosis although the patient does have a history of trauma and fall to that area.  Based on the PET findings I ordered a CT-guided biopsy of the soft tissue which was negative for malignancy showed fragments of skeletal muscle fibrous tissue with hematoma.  She still has occasional intermittent pain in the right chest wall again she confirms this was an area where she had a previous fall.  Her breathing is stable specifically denies cough hemoptysis or chest tightness.  COMPLICATIONS OF TREATMENT: none  FOLLOW UP COMPLIANCE: keeps appointments   PHYSICAL EXAM:  BP (!) 108/40   Pulse (!) 56   Temp 98 F (36.7 C)   Resp 18   Ht 5' 5 (1.651 m)   Wt 184 lb (83.5 kg)   BMI 30.62 kg/m  Well-developed well-nourished patient in NAD. HEENT reveals PERLA, EOMI, discs not visualized.  Oral cavity is clear. No oral mucosal lesions are identified. Neck is clear without evidence of cervical or supraclavicular adenopathy. Lungs are clear to  A&P. Cardiac examination is essentially unremarkable with regular rate and rhythm without murmur rub or thrill. Abdomen is benign with no organomegaly or masses noted. Motor sensory and DTR levels are equal and symmetric in the upper and lower extremities. Cranial nerves II through XII are grossly intact. Proprioception is intact. No peripheral adenopathy or edema is identified. No motor or sensory levels are noted. Crude visual fields are within normal range.  RADIOLOGY RESULTS: PET CT scan and pathology reports reviewed  PLAN: Present time patient is doing well clinically.  At this time I will just see her back in 6 months with a repeat CT scan of her chest.  Will attribute all the CT changes to possible fall and fracture of her right sixth rib.  Patient knows to call sooner.  She continues close follow-up care with pulmonology.  I would like to take this opportunity to thank you for allowing me to participate in the care of your patient.SABRA Marcey Penton, MD

## 2023-10-28 ENCOUNTER — Encounter: Payer: Self-pay | Admitting: Radiation Oncology

## 2023-11-22 ENCOUNTER — Encounter

## 2023-12-27 ENCOUNTER — Ambulatory Visit
Admission: RE | Admit: 2023-12-27 | Discharge: 2023-12-27 | Disposition: A | Discharge status: Home / Self Care | Source: Ambulatory Visit | Attending: Internal Medicine | Admitting: Internal Medicine

## 2023-12-27 DIAGNOSIS — Z1231 Encounter for screening mammogram for malignant neoplasm of breast: Secondary | ICD-10-CM | POA: Insufficient documentation

## 2024-04-19 ENCOUNTER — Other Ambulatory Visit

## 2024-05-03 ENCOUNTER — Ambulatory Visit: Admitting: Radiation Oncology
